# Patient Record
Sex: Male | Born: 1955 | Race: White | Hispanic: No | Smoking: Current some day smoker
Health system: Southern US, Community
[De-identification: ages and names within clinical notes are randomized; demographics above are authoritative.]

## PROBLEM LIST (undated history)

## (undated) DIAGNOSIS — I10 Essential (primary) hypertension: Secondary | ICD-10-CM

## (undated) DIAGNOSIS — C4490 Unspecified malignant neoplasm of skin, unspecified: Secondary | ICD-10-CM

## (undated) DIAGNOSIS — F419 Anxiety disorder, unspecified: Secondary | ICD-10-CM

## (undated) DIAGNOSIS — G629 Polyneuropathy, unspecified: Secondary | ICD-10-CM

## (undated) DIAGNOSIS — C801 Malignant (primary) neoplasm, unspecified: Secondary | ICD-10-CM

## (undated) DIAGNOSIS — J449 Chronic obstructive pulmonary disease, unspecified: Secondary | ICD-10-CM

## (undated) DIAGNOSIS — J45909 Unspecified asthma, uncomplicated: Secondary | ICD-10-CM

## (undated) DIAGNOSIS — F99 Mental disorder, not otherwise specified: Secondary | ICD-10-CM

## (undated) DIAGNOSIS — F329 Major depressive disorder, single episode, unspecified: Secondary | ICD-10-CM

## (undated) DIAGNOSIS — J189 Pneumonia, unspecified organism: Secondary | ICD-10-CM

## (undated) DIAGNOSIS — G8929 Other chronic pain: Secondary | ICD-10-CM

## (undated) DIAGNOSIS — M549 Dorsalgia, unspecified: Secondary | ICD-10-CM

## (undated) DIAGNOSIS — K219 Gastro-esophageal reflux disease without esophagitis: Secondary | ICD-10-CM

## (undated) DIAGNOSIS — I2510 Atherosclerotic heart disease of native coronary artery without angina pectoris: Secondary | ICD-10-CM

## (undated) DIAGNOSIS — R0602 Shortness of breath: Secondary | ICD-10-CM

## (undated) DIAGNOSIS — F32A Depression, unspecified: Secondary | ICD-10-CM

## (undated) HISTORY — DX: Malignant (primary) neoplasm, unspecified: C80.1

## (undated) HISTORY — PX: BRONCHOSCOPY: SUR163

## (undated) HISTORY — DX: Unspecified malignant neoplasm of skin, unspecified: C44.90

## (undated) HISTORY — PX: FEMUR SURGERY: SHX943

---

## 2002-11-07 ENCOUNTER — Encounter: Payer: Self-pay | Admitting: Specialist

## 2002-11-07 ENCOUNTER — Encounter: Payer: Self-pay | Admitting: Radiology

## 2002-11-07 ENCOUNTER — Encounter: Admission: RE | Admit: 2002-11-07 | Discharge: 2002-11-07 | Payer: Self-pay | Admitting: Specialist

## 2002-11-21 ENCOUNTER — Encounter: Payer: Self-pay | Admitting: Specialist

## 2002-11-21 ENCOUNTER — Encounter: Admission: RE | Admit: 2002-11-21 | Discharge: 2002-11-21 | Payer: Self-pay | Admitting: Specialist

## 2002-12-17 ENCOUNTER — Encounter: Payer: Self-pay | Admitting: Specialist

## 2002-12-17 ENCOUNTER — Encounter: Admission: RE | Admit: 2002-12-17 | Discharge: 2002-12-17 | Payer: Self-pay | Admitting: Specialist

## 2005-09-05 ENCOUNTER — Other Ambulatory Visit: Payer: Self-pay

## 2005-09-05 ENCOUNTER — Inpatient Hospital Stay: Payer: Self-pay | Admitting: Internal Medicine

## 2005-09-17 ENCOUNTER — Emergency Department (HOSPITAL_COMMUNITY): Admission: EM | Admit: 2005-09-17 | Discharge: 2005-09-17 | Payer: Self-pay | Admitting: Emergency Medicine

## 2007-02-07 ENCOUNTER — Other Ambulatory Visit: Payer: Self-pay

## 2007-02-07 ENCOUNTER — Inpatient Hospital Stay: Payer: Self-pay | Admitting: Internal Medicine

## 2007-08-04 ENCOUNTER — Emergency Department (HOSPITAL_COMMUNITY): Admission: EM | Admit: 2007-08-04 | Discharge: 2007-08-04 | Payer: Self-pay | Admitting: Emergency Medicine

## 2007-12-25 ENCOUNTER — Emergency Department: Payer: Self-pay | Admitting: Emergency Medicine

## 2008-05-03 ENCOUNTER — Emergency Department: Payer: Self-pay | Admitting: Emergency Medicine

## 2008-10-13 ENCOUNTER — Ambulatory Visit: Payer: Self-pay | Admitting: Internal Medicine

## 2009-01-12 ENCOUNTER — Ambulatory Visit: Payer: Self-pay | Admitting: Internal Medicine

## 2009-01-12 ENCOUNTER — Encounter (INDEPENDENT_AMBULATORY_CARE_PROVIDER_SITE_OTHER): Payer: Self-pay | Admitting: Emergency Medicine

## 2009-01-12 ENCOUNTER — Inpatient Hospital Stay (HOSPITAL_COMMUNITY): Admission: EM | Admit: 2009-01-12 | Discharge: 2009-01-20 | Payer: Self-pay | Admitting: Emergency Medicine

## 2009-01-13 ENCOUNTER — Encounter (INDEPENDENT_AMBULATORY_CARE_PROVIDER_SITE_OTHER): Payer: Self-pay | Admitting: Internal Medicine

## 2009-01-20 ENCOUNTER — Inpatient Hospital Stay (HOSPITAL_COMMUNITY): Admission: AD | Admit: 2009-01-20 | Discharge: 2009-02-03 | Payer: Self-pay | Admitting: Psychiatry

## 2009-01-20 ENCOUNTER — Ambulatory Visit: Payer: Self-pay | Admitting: Psychiatry

## 2009-02-13 ENCOUNTER — Emergency Department (HOSPITAL_COMMUNITY): Admission: EM | Admit: 2009-02-13 | Discharge: 2009-02-13 | Payer: Self-pay | Admitting: Emergency Medicine

## 2009-03-10 ENCOUNTER — Encounter: Payer: Self-pay | Admitting: Neurology

## 2009-03-27 ENCOUNTER — Encounter: Payer: Self-pay | Admitting: Neurology

## 2009-04-27 ENCOUNTER — Encounter: Payer: Self-pay | Admitting: Neurology

## 2009-05-08 ENCOUNTER — Emergency Department (HOSPITAL_COMMUNITY): Admission: EM | Admit: 2009-05-08 | Discharge: 2009-05-09 | Payer: Self-pay | Admitting: Emergency Medicine

## 2009-05-20 ENCOUNTER — Encounter: Payer: Self-pay | Admitting: Emergency Medicine

## 2009-05-21 ENCOUNTER — Ambulatory Visit: Payer: Self-pay | Admitting: Psychiatry

## 2009-05-21 ENCOUNTER — Inpatient Hospital Stay (HOSPITAL_COMMUNITY): Admission: AD | Admit: 2009-05-21 | Discharge: 2009-06-01 | Payer: Self-pay | Admitting: Psychiatry

## 2009-06-01 ENCOUNTER — Emergency Department (HOSPITAL_COMMUNITY): Admission: EM | Admit: 2009-06-01 | Discharge: 2009-06-01 | Payer: Self-pay | Admitting: Emergency Medicine

## 2009-06-27 HISTORY — PX: BACK SURGERY: SHX140

## 2010-01-24 ENCOUNTER — Emergency Department: Payer: Self-pay | Admitting: Unknown Physician Specialty

## 2010-04-15 ENCOUNTER — Ambulatory Visit: Payer: Self-pay

## 2010-05-07 ENCOUNTER — Ambulatory Visit: Payer: Self-pay | Admitting: Unknown Physician Specialty

## 2010-05-14 ENCOUNTER — Inpatient Hospital Stay: Payer: Self-pay | Admitting: Internal Medicine

## 2010-07-18 ENCOUNTER — Encounter: Payer: Self-pay | Admitting: Internal Medicine

## 2010-08-09 ENCOUNTER — Emergency Department (HOSPITAL_COMMUNITY)
Admission: EM | Admit: 2010-08-09 | Discharge: 2010-08-09 | Disposition: A | Discharge status: Home / Self Care | Payer: Medicaid Other | Attending: Emergency Medicine | Admitting: Emergency Medicine

## 2010-08-09 ENCOUNTER — Emergency Department (HOSPITAL_COMMUNITY): Payer: Medicaid Other

## 2010-08-09 DIAGNOSIS — R110 Nausea: Secondary | ICD-10-CM | POA: Insufficient documentation

## 2010-08-09 DIAGNOSIS — F919 Conduct disorder, unspecified: Secondary | ICD-10-CM | POA: Insufficient documentation

## 2010-08-09 DIAGNOSIS — R51 Headache: Secondary | ICD-10-CM | POA: Insufficient documentation

## 2010-08-09 DIAGNOSIS — F341 Dysthymic disorder: Secondary | ICD-10-CM | POA: Insufficient documentation

## 2010-08-17 ENCOUNTER — Other Ambulatory Visit: Payer: Self-pay | Admitting: Internal Medicine

## 2010-08-17 DIAGNOSIS — D497 Neoplasm of unspecified behavior of endocrine glands and other parts of nervous system: Secondary | ICD-10-CM

## 2010-08-31 ENCOUNTER — Ambulatory Visit: Payer: Self-pay | Admitting: Unknown Physician Specialty

## 2010-09-02 ENCOUNTER — Inpatient Hospital Stay: Payer: Self-pay | Admitting: Unknown Physician Specialty

## 2010-09-23 ENCOUNTER — Other Ambulatory Visit: Payer: Medicaid Other

## 2010-09-24 ENCOUNTER — Ambulatory Visit
Admission: RE | Admit: 2010-09-24 | Discharge: 2010-09-24 | Disposition: A | Discharge status: Home / Self Care | Payer: Medicaid Other | Source: Ambulatory Visit | Attending: Internal Medicine | Admitting: Internal Medicine

## 2010-09-24 DIAGNOSIS — D497 Neoplasm of unspecified behavior of endocrine glands and other parts of nervous system: Secondary | ICD-10-CM

## 2010-09-24 MED ORDER — GADOBENATE DIMEGLUMINE 529 MG/ML IV SOLN
10.0000 mL | Freq: Once | INTRAVENOUS | Status: AC | PRN
  Administered 2010-09-24: 10 mL via INTRAVENOUS

## 2010-09-28 LAB — CBC
HCT: 37.5 % — ABNORMAL LOW (ref 39.0–52.0)
Hemoglobin: 12.9 g/dL — ABNORMAL LOW (ref 13.0–17.0)
RBC: 4.02 MIL/uL — ABNORMAL LOW (ref 4.22–5.81)
RDW: 14.9 % (ref 11.5–15.5)

## 2010-09-28 LAB — BASIC METABOLIC PANEL
CO2: 29 mEq/L (ref 19–32)
Calcium: 8.7 mg/dL (ref 8.4–10.5)
GFR calc Af Amer: >60 mL/min (ref >60)
GFR calc non Af Amer: >60 mL/min (ref >60)
Glucose, Bld: 117 mg/dL — ABNORMAL HIGH (ref 70–99)
Potassium: 3.9 mEq/L (ref 3.5–5.1)
Sodium: 139 mEq/L (ref 135–145)

## 2010-09-29 LAB — BASIC METABOLIC PANEL
BUN: 23 mg/dL (ref 6–23)
CO2: 25 mEq/L (ref 19–32)
Glucose, Bld: 100 mg/dL — ABNORMAL HIGH (ref 70–99)
Potassium: 3.6 mEq/L (ref 3.5–5.1)
Sodium: 138 mEq/L (ref 135–145)

## 2010-09-29 LAB — COMPREHENSIVE METABOLIC PANEL
ALT: 14 U/L (ref 0–53)
AST: 18 U/L (ref 0–37)
Albumin: 4.3 g/dL (ref 3.5–5.2)
Alkaline Phosphatase: 62 U/L (ref 39–117)
Chloride: 103 mEq/L (ref 96–112)
GFR calc Af Amer: >60 mL/min (ref >60)
Potassium: 3.3 mEq/L — ABNORMAL LOW (ref 3.5–5.1)
Sodium: 142 mEq/L (ref 135–145)
Total Bilirubin: 0.9 mg/dL (ref 0.3–1.2)
Total Protein: 7.1 g/dL (ref 6.0–8.3)

## 2010-09-29 LAB — POCT CARDIAC MARKERS
Myoglobin, poc: 63.5 ng/mL (ref 12–200)
Myoglobin, poc: 76.6 ng/mL (ref 12–200)
Troponin i, poc: <0.05 ng/mL (ref 0.00–0.09)

## 2010-09-29 LAB — DIFFERENTIAL
Basophils Absolute: 0 10*3/uL (ref 0.0–0.1)
Basophils Absolute: 0 10*3/uL (ref 0.0–0.1)
Basophils Relative: 1 % (ref 0–1)
Basophils Relative: 1 % (ref 0–1)
Eosinophils Absolute: 0 10*3/uL (ref 0.0–0.7)
Eosinophils Absolute: 0.1 10*3/uL (ref 0.0–0.7)
Eosinophils Relative: 1 % (ref 0–5)
Eosinophils Relative: 1 % (ref 0–5)
Monocytes Absolute: 0.4 10*3/uL (ref 0.1–1.0)
Monocytes Absolute: 0.8 10*3/uL (ref 0.1–1.0)
Monocytes Relative: 7 % (ref 3–12)
Neutro Abs: 3.4 10*3/uL (ref 1.7–7.7)

## 2010-09-29 LAB — RAPID URINE DRUG SCREEN, HOSP PERFORMED
Amphetamines: NOT DETECTED
Cocaine: NOT DETECTED
Opiates: NOT DETECTED
Tetrahydrocannabinol: NOT DETECTED

## 2010-09-29 LAB — CBC
HCT: 46.3 % (ref 39.0–52.0)
HCT: 46.5 % (ref 39.0–52.0)
Hemoglobin: 16 g/dL (ref 13.0–17.0)
MCHC: 34.4 g/dL (ref 30.0–36.0)
Platelets: 110 10*3/uL — ABNORMAL LOW (ref 150–400)
RDW: 15.1 % (ref 11.5–15.5)
RDW: 15.6 % — ABNORMAL HIGH (ref 11.5–15.5)
WBC: 5.6 10*3/uL (ref 4.0–10.5)

## 2010-09-29 LAB — URINALYSIS, ROUTINE W REFLEX MICROSCOPIC
Bilirubin Urine: NEGATIVE
Hgb urine dipstick: NEGATIVE
Ketones, ur: NEGATIVE mg/dL
Specific Gravity, Urine: 1.03 (ref 1.005–1.030)
Urobilinogen, UA: 1 mg/dL (ref 0.0–1.0)

## 2010-09-29 LAB — LIPASE, BLOOD: Lipase: 17 U/L (ref 11–59)

## 2010-09-29 LAB — ETHANOL: Alcohol, Ethyl (B): <5 mg/dL (ref 0–10)

## 2010-10-02 LAB — COMPREHENSIVE METABOLIC PANEL
ALT: 23 U/L (ref 0–53)
CO2: 26 mEq/L (ref 19–32)
Calcium: 8.5 mg/dL (ref 8.4–10.5)
Creatinine, Ser: 0.91 mg/dL (ref 0.4–1.5)
GFR calc Af Amer: >60 mL/min (ref >60)
GFR calc non Af Amer: >60 mL/min (ref >60)
Glucose, Bld: 94 mg/dL (ref 70–99)
Sodium: 140 mEq/L (ref 135–145)
Total Protein: 6 g/dL (ref 6.0–8.3)

## 2010-10-02 LAB — PROTIME-INR
INR: 1 (ref 0.00–1.49)
Prothrombin Time: 13.1 seconds (ref 11.6–15.2)

## 2010-10-02 LAB — CBC
HCT: 36.6 % — ABNORMAL LOW (ref 39.0–52.0)
HCT: 42.9 % (ref 39.0–52.0)
Hemoglobin: 12.4 g/dL — ABNORMAL LOW (ref 13.0–17.0)
Hemoglobin: 14.4 g/dL (ref 13.0–17.0)
MCHC: 34 g/dL (ref 30.0–36.0)
MCV: 94.1 fL (ref 78.0–100.0)
RBC: 4.55 MIL/uL (ref 4.22–5.81)
RDW: 13.1 % (ref 11.5–15.5)
WBC: 8.1 10*3/uL (ref 4.0–10.5)

## 2010-10-02 LAB — HEPATIC FUNCTION PANEL
ALT: 22 U/L (ref 0–53)
AST: 23 U/L (ref 0–37)
Albumin: 4 g/dL (ref 3.5–5.2)
Alkaline Phosphatase: 64 U/L (ref 39–117)
Bilirubin, Direct: 0.1 mg/dL (ref 0.0–0.3)
Total Bilirubin: 0.6 mg/dL (ref 0.3–1.2)

## 2010-10-02 LAB — DIFFERENTIAL
Lymphocytes Relative: 20 % (ref 12–46)
Lymphs Abs: 1.3 10*3/uL (ref 0.7–4.0)
Monocytes Relative: 6 % (ref 3–12)
Neutrophils Relative %: 72 % (ref 43–77)

## 2010-10-03 LAB — BASIC METABOLIC PANEL
BUN: 13 mg/dL (ref 6–23)
BUN: 9 mg/dL (ref 6–23)
CO2: 29 mEq/L (ref 19–32)
CO2: 29 mEq/L (ref 19–32)
CO2: 30 mEq/L (ref 19–32)
Calcium: 8.2 mg/dL — ABNORMAL LOW (ref 8.4–10.5)
Chloride: 101 mEq/L (ref 96–112)
Chloride: 103 mEq/L (ref 96–112)
Chloride: 103 mEq/L (ref 96–112)
Chloride: 105 mEq/L (ref 96–112)
Creatinine, Ser: 0.97 mg/dL (ref 0.4–1.5)
Creatinine, Ser: 1.14 mg/dL (ref 0.4–1.5)
Creatinine, Ser: 3.27 mg/dL — ABNORMAL HIGH (ref 0.4–1.5)
GFR calc Af Amer: 24 mL/min — ABNORMAL LOW (ref >60)
GFR calc Af Amer: >60 mL/min (ref >60)
GFR calc Af Amer: >60 mL/min (ref >60)
GFR calc Af Amer: >60 mL/min (ref >60)
GFR calc non Af Amer: >60 mL/min (ref >60)
GFR calc non Af Amer: >60 mL/min (ref >60)
Glucose, Bld: 102 mg/dL — ABNORMAL HIGH (ref 70–99)
Glucose, Bld: 109 mg/dL — ABNORMAL HIGH (ref 70–99)
Glucose, Bld: 139 mg/dL — ABNORMAL HIGH (ref 70–99)
Glucose, Bld: 147 mg/dL — ABNORMAL HIGH (ref 70–99)
Potassium: 3.7 mEq/L (ref 3.5–5.1)
Potassium: 3.8 mEq/L (ref 3.5–5.1)
Potassium: 4.6 mEq/L (ref 3.5–5.1)
Sodium: 135 mEq/L (ref 135–145)
Sodium: 137 mEq/L (ref 135–145)
Sodium: 140 mEq/L (ref 135–145)

## 2010-10-03 LAB — CBC
HCT: 36.8 % — ABNORMAL LOW (ref 39.0–52.0)
HCT: 37.2 % — ABNORMAL LOW (ref 39.0–52.0)
HCT: 38.5 % — ABNORMAL LOW (ref 39.0–52.0)
HCT: 38.9 % — ABNORMAL LOW (ref 39.0–52.0)
HCT: 41.5 % (ref 39.0–52.0)
Hemoglobin: 12.6 g/dL — ABNORMAL LOW (ref 13.0–17.0)
Hemoglobin: 12.7 g/dL — ABNORMAL LOW (ref 13.0–17.0)
Hemoglobin: 12.9 g/dL — ABNORMAL LOW (ref 13.0–17.0)
Hemoglobin: 13.2 g/dL (ref 13.0–17.0)
MCHC: 33.9 g/dL (ref 30.0–36.0)
MCHC: 34.1 g/dL (ref 30.0–36.0)
MCHC: 34.7 g/dL (ref 30.0–36.0)
MCHC: 35.1 g/dL (ref 30.0–36.0)
MCV: 94.1 fL (ref 78.0–100.0)
MCV: 94.6 fL (ref 78.0–100.0)
MCV: 94.6 fL (ref 78.0–100.0)
MCV: 94.8 fL (ref 78.0–100.0)
MCV: 94.8 fL (ref 78.0–100.0)
Platelets: 103 K/uL — ABNORMAL LOW (ref 150–400)
Platelets: 114 10*3/uL — ABNORMAL LOW (ref 150–400)
Platelets: 161 10*3/uL (ref 150–400)
RBC: 3.89 MIL/uL — ABNORMAL LOW (ref 4.22–5.81)
RBC: 3.93 MIL/uL — ABNORMAL LOW (ref 4.22–5.81)
RBC: 4.02 MIL/uL — ABNORMAL LOW (ref 4.22–5.81)
RBC: 4.13 MIL/uL — ABNORMAL LOW (ref 4.22–5.81)
RDW: 13.5 % (ref 11.5–15.5)
RDW: 13.5 % (ref 11.5–15.5)
RDW: 14 % (ref 11.5–15.5)
RDW: 14 % (ref 11.5–15.5)
RDW: 14.2 % (ref 11.5–15.5)
WBC: 4.5 10*3/uL (ref 4.0–10.5)
WBC: 6.4 K/uL (ref 4.0–10.5)
WBC: 8.7 10*3/uL (ref 4.0–10.5)

## 2010-10-03 LAB — URINALYSIS, ROUTINE W REFLEX MICROSCOPIC
Bilirubin Urine: NEGATIVE
Glucose, UA: NEGATIVE mg/dL
Hgb urine dipstick: NEGATIVE
Ketones, ur: NEGATIVE mg/dL
Nitrite: NEGATIVE
Protein, ur: NEGATIVE mg/dL
Specific Gravity, Urine: 1.013 (ref 1.005–1.030)
Urobilinogen, UA: 1 mg/dL (ref 0.0–1.0)
pH: 5.5 (ref 5.0–8.0)

## 2010-10-03 LAB — IRON AND TIBC
Saturation Ratios: 57 % — ABNORMAL HIGH (ref 20–55)
TIBC: 307 ug/dL (ref 215–435)

## 2010-10-03 LAB — CK TOTAL AND CKMB (NOT AT ARMC)
CK, MB: 3.1 ng/mL (ref 0.3–4.0)
Relative Index: 0.6 (ref 0.0–2.5)
Total CK: 367 U/L — ABNORMAL HIGH (ref 7–232)
Total CK: 542 U/L — ABNORMAL HIGH (ref 7–232)

## 2010-10-03 LAB — CARDIAC PANEL(CRET KIN+CKTOT+MB+TROPI)
CK, MB: 1.8 ng/mL (ref 0.3–4.0)
CK, MB: 2.1 ng/mL (ref 0.3–4.0)
Relative Index: 0.8 (ref 0.0–2.5)
Relative Index: 0.9 (ref 0.0–2.5)

## 2010-10-03 LAB — GLUCOSE, CAPILLARY: Glucose-Capillary: 161 mg/dL — ABNORMAL HIGH (ref 70–99)

## 2010-10-03 LAB — ACTH STIMULATION, 3 TIME POINTS: Cortisol, Base: 0.4 ug/dL

## 2010-10-03 LAB — BASIC METABOLIC PANEL WITH GFR
BUN: 37 mg/dL — ABNORMAL HIGH (ref 6–23)
Calcium: 8.1 mg/dL — ABNORMAL LOW (ref 8.4–10.5)
GFR calc non Af Amer: 20 mL/min — ABNORMAL LOW (ref >60)
Potassium: 3.7 meq/L (ref 3.5–5.1)
Sodium: 139 meq/L (ref 135–145)

## 2010-10-03 LAB — CULTURE, BLOOD (ROUTINE X 2)
Culture: NO GROWTH
Culture: NO GROWTH

## 2010-10-03 LAB — LIPID PANEL
Cholesterol: 158 mg/dL (ref 0–200)
HDL: 36 mg/dL — ABNORMAL LOW (ref >39)
LDL Cholesterol: 98 mg/dL (ref 0–99)
Triglycerides: 122 mg/dL (ref <150)

## 2010-10-03 LAB — DIFFERENTIAL
Eosinophils Absolute: 0 10*3/uL (ref 0.0–0.7)
Eosinophils Relative: 0 % (ref 0–5)
Lymphs Abs: 0.3 10*3/uL — ABNORMAL LOW (ref 0.7–4.0)
Monocytes Relative: 1 % — ABNORMAL LOW (ref 3–12)
Neutrophils Relative %: 91 % — ABNORMAL HIGH (ref 43–77)

## 2010-10-03 LAB — COMPREHENSIVE METABOLIC PANEL
ALT: 21 U/L (ref 0–53)
AST: 33 U/L (ref 0–37)
Calcium: 8.1 mg/dL — ABNORMAL LOW (ref 8.4–10.5)
GFR calc Af Amer: 37 mL/min — ABNORMAL LOW (ref >60)
Sodium: 141 mEq/L (ref 135–145)
Total Protein: 6.7 g/dL (ref 6.0–8.3)

## 2010-10-03 LAB — URINE CULTURE

## 2010-10-03 LAB — POCT I-STAT 3, ART BLOOD GAS (G3+): O2 Saturation: 96 %

## 2010-10-03 LAB — CORTISOL: Cortisol, Plasma: 0.6 ug/dL

## 2010-10-03 LAB — TESTOSTERONE, % FREE: Testosterone-% Free: 2 % — ABNORMAL HIGH (ref 1.6–2.9)

## 2010-10-03 LAB — RAPID URINE DRUG SCREEN, HOSP PERFORMED
Cocaine: NOT DETECTED
Opiates: NOT DETECTED

## 2010-10-03 LAB — LIPASE, BLOOD: Lipase: 13 U/L (ref 11–59)

## 2010-10-03 LAB — TSH: TSH: 0.063 u[IU]/mL — ABNORMAL LOW (ref 0.350–4.500)

## 2010-10-03 LAB — TROPONIN I: Troponin I: 0.01 ng/mL (ref 0.00–0.06)

## 2010-10-03 LAB — FOLLICLE STIMULATING HORMONE: FSH: 2.7 m[IU]/mL (ref 1.4–18.1)

## 2010-10-03 LAB — ACTH: C206 ACTH: 6 pg/mL — ABNORMAL LOW (ref 10–46)

## 2010-10-03 LAB — RETICULOCYTES
RBC.: 4.04 MIL/uL — ABNORMAL LOW (ref 4.22–5.81)
Retic Count, Absolute: 28.3 10*3/uL (ref 19.0–186.0)

## 2010-10-03 LAB — RPR: RPR Ser Ql: NONREACTIVE

## 2010-10-03 LAB — TESTOSTERONE, FREE: Testosterone, Free: 40.3 pg/mL — ABNORMAL LOW (ref 47.0–244.0)

## 2010-11-09 NOTE — Discharge Summary (Signed)
Steven Wiley, Steven Wiley                 ACCOUNT NO.:  [id number]   MEDICAL RECORD NO.:  [id number]           PATIENT TYPE:   LOCATION:                                 FACILITY:   PHYSICIAN:  Alvester Morin, M.D.  DATE OF BIRTH:  [birth year]   DATE OF ADMISSION:  DATE OF DISCHARGE:                               DISCHARGE SUMMARY   Attending:  Dr. Harriett Sine Phifer   DISCHARGE DIAGNOSES:  1. Adrenal insufficiency.  2. Chronic obstructive pulmonary disease with acute exacerbation.  3. Chest pain, resolved.  4. Bipolar disorder, not otherwise specified.  5. Anxiety disorder, not otherwise specified.  6. Pituitary microadenoma.  7. Headache.  8. Tinnitus.  9. Low testosterone.  10.Thrush.  11.Thrombocytopenia, resolved.  12.Acute renal failure, resolved.  13.Chronic back pain.   DISCHARGE MEDICATIONS:  1. Albuterol 90 mcg per spray 2 puffs inhaled q.4-6 h. p.r.n.      shortness of breath.  2. Advair 250/50 mcg 1 puff inhaled b.i.d.  3. Aspirin 81 mg p.o. daily.  4. Prednisone 20 mg p.o. once on Monday, January 19, 2009.  5. Prednisone 10 mg p.o. once on Tuesday, January 20, 2009.  6. Hydrocortisone 20 mg by mouth between 6 and 8 a.m. and 10 mg by      mouth between 4 and 6 p.m. starting on Wednesday, January 21, 2009.  7. Ativan 0.5 mg p.o. q.8 h. p.r.n. anxiety.  8. Depakote 250 mg p.o. daily at 6:00 p.m.  9. Artificial tears 1 drop per eye 3 times daily.  10.Prilosec 40 mg p.o. daily.  11.Oxycodone 5-10 mg p.o. q.6 h. p.r.n. pain.  12.Nystatin 5 mL by mouth 3 times daily until Monday, January 26, 2009.   DISPOSITION AND FOLLOWUP:  1. The patient will be transferred to behavioral health for inpatient      psychiatric treatment.  2. Follow up with Dr. Sharl Ma at [facility] on      Wednesday, January 21, 2009, at 10:15 in the morning for a further      workup and management of adrenal insufficiency.  3. The patient will need a repeat MRI of pituitary and brain with  contrast in 6 months to follow up on the identified microadenoma of      the pituitary.  4. The patient will need repeat CBC and LFTs before February 08, 2009,      as Depakote was started during this admission.   PROCEDURES PERFORMED:  1. CT head without contrast on January 12, 2009:  No acute intracranial      abnormality.  Mild small vessel ischemic change.  2. Chest x-ray on January 12, 2009:  No acute cardiopulmonary disease.      Mild peribronchial thickening, which may be related to chronic      bronchitis or smoking.  3. A 2-D echocardiogram on January 13, 2009:  The left ventricle cavity      size was normal.  Systolic function was normal with an estimated      ejection fraction in the range of 55-60%.  Left ventricular wall  motion was normal.  There was mild mitral regurgitation.  The peak      pulmonary artery pressure was 42 mmHg.  4. MR of the brain with and without contrast on January 15, 2009:  Two      areas of relative delayed enhancement within the pituitary gland      were seen on dynamic imaging.  The larger is in the right side of      the gland. measuring 6.5 x 5.7 mm.  The smaller is on the left,      measuring 2.8 x 4.8 mm.  Both of these are concerning for pituitary      microadenomas.  No acute intracranial abnormalities are present.      Specifically, there is no evidence for acute infarct, hemorrhage,      mass, hydrocephalus, or extra-axial fluid collection.   CONSULTATIONS:  1. Tonita Cong, MD of [facility] at [facility],      Endocrinology.  2. Antonietta Breach, MD from Psychiatry.   ADMITTING HISTORY AND PHYSICAL:  History of Present Illness:  Steven Wiley  is a 55 year old male with no significant past medical history besides  chronic back pain, who presents to the emergency department complaining  of headache and weakness.  He says his headache is an 8/10 with 4  episodes per day that started approximately 6 months ago.  He is also  complaining of  weakness, tiredness since 3 weeks ago.  He has noticed  decreased appetite and poor oral intake.  He relates chest pain 1  episode on the day prior to admission, which lasted approximately 10  minutes, that was described as being pressure in quality, 6/10,  accompanied by shortness of breath.  He has also had a cough for the  past couple of weeks with yellow sputum.   ADMISSION PHYSICAL EXAMINATION:  VITAL SIGNS:  Temperature 98, blood  pressure 90/68 which dropped to a minimum of 67/28, pulse 53,  respiratory rate 15, oxygen saturation 94% on room air.  GENERAL:  The patient was lying in bed, in no acute distress.  HEENT:  His pupils were equal and reactive to light and accommodation,  and his extraocular movements were intact.  He had dry mucous membranes.  NECK:  Supple without thyromegaly.  CHEST:  There is bilateral air movement on his pulmonary exam; however,  there were scattered wheezes.  CARDIOVASCULAR:  Normal S1 and S2 with a regular rate and rhythm with  distant heart sounds.  ABDOMEN:  Positive bowel sounds, soft, obese, mild epigastric  tenderness.  EXTREMITIES:  There was no edema in his extremities, and he had full  peripheral pulses.  There were a number of small erythematous patches on  the left under arm, which were not indurated and nonerythematous, which  the patient said stemmed from a recent black widow spider bite.  There  were no other rashes.  There was no lymphadenopathy.  There was no joint  swelling or edema.  NEUROLOGIC:  He had a nonfocal neurologic exam.  PSYCH:  His weight was appropriate.   ADMISSION STUDIES AND LABORATORY DATA:  1. CT of the head:  Please see above under procedures performed.  2. Chest x-ray:  Please see above under procedures performed.  3. BMP:  Sodium 139, potassium 3.7, chloride 101, bicarbonate 30, BUN      37, creatinine 3.27, glucose 102, calcium 8.1.  4. CBC:  White blood cell count 6.4, hemoglobin 12.7, platelets 103,  MCV 94.  5. Cardiac biomarkers:  Total CK 542, CK-MB 3.1, troponin I 2.01.  6. Urine drug screen:  Positive for benzodiazepines.  7. Urinalysis:  Normal.  8. EKG showed normal sinus rhythm with a rate of 64, normal intervals,      normal axis, no ischemic changes.   HOSPITAL COURSE:  1. Adrenal insufficiency:  Mr. Gikas presented to the emergency      department with weakness, fatigue, and chronic headache, and was      found to be hypotensive to a minimum of 70/30.  He was volume      resuscitated, and his blood pressure rose and remained stable      around 110-130 over 60-80 throughout the remainder of his      hospitalization.  A cortisol level was checked and found to be      markedly depressed at 0.6.  The patient received Solu-Medrol      (transitioned to a prednisone taper) for his COPD exacerbation (see      problem #2), and the dose of prednisone was more than enough to      treat his adrenal insufficiency.  An ACTH stimulation test was      performed and showed a baseline cortisol of 0.4, a 30-minute      cortisol of 6.4, and a 60-minute cortisol of 9.5.  This was      consistent with secondary adrenal insufficiency with sluggish      response secondary to adrenal atrophy from chronically low ACTH      levels.  An ACTH level was drawn to confirm secondary vs primary      adrenal insufficiency but was pending at the time of discharge.      FSH, TSH, and testosterone levels were drawn to evaluate for      panhypopituitarism.  FSH was found to be at the low end of normal      at 2.7, free testosterone was low at 40.3, and TSH was markedly      depressed at 0.063.  This made [place] suspicious for pituitary      insufficiency.  An MRI of the brain showed 2 pituitary      microadenomas.  TSH was rechecked and was found to be normal as was      the free T4.  We feel that the TSH may have been artificially      suppressed by his preceding doses of Solu-Medrol and Decadron      (which  he received in the ED as part of the headache protocol).      Dr. Sharl Ma from Endocrinology was consulted and his help was greatly      appreciated.  Per his recommendations, additional outpatient workup      will be performed (including IGF-1, prolactin, ACTH, and repeat      testosterone, cortisol).  Mr. Donaghue should be placed on      hydrocortisone 20 mg by mouth in the morning and 10 mg by mouth in      the evening once his prednisone is tapered to less than 7.5 mg per      day or less.  We have arranged for this, hydrocortisone to be      started on Wednesday, January 21, 2009.  He should also have a repeat      MR of the brain and pituitary to assess for interval change in the      microadenomas in approximately 6 months.  2. Chronic obstructive pulmonary disease exacerbation:  Mr. Faries      presented with bilateral wheezing and oxygen saturation of 94% on      room air.  His symptoms worsened on the night of admission.  His      chest x-ray was consistent with chronic bronchitis.  He was treated      with Solu-Medrol, later transitioned to prednisone.  He also      received scheduled albuterol and ipratropium nebulizers.  He      completed a 7-day course of moxifloxacin.  He remained afebrile      throughout, and his symptoms steadily improved.  He was stable on      room air at the time of discharge.  3. Chest pain, resolved:  He complained of chest pain at admission.      In the setting of hypotension, cardiogenic shock was in the      differential.  He was ruled out for an acute coronary syndrome with      a normal EKG, 3 sets of cardiac biomarkers with normal CK-MB and      troponin I.  He was monitored on telemetry without event until he      was transferred to the regular floor.  He had a 2-D echocardiogram,      which was consistent with his history of COPD.  He was placed on      pantoprazole to address any element of GERD.  His chest discomfort      resolved significantly with  treatment of his COPD exacerbation and      anxiety probably at least contributed to his symptoms.  4. Bipolar disorder, not otherwise specified:  The patient had a very      labile affect throughout his admission, periodically having bouts      of crying.  He describes significant stressors and the lack of      social support.  His low cortisol was likely also contributing to      his mood abnormalities.  When confronted about his safety from a      psychiatric standpoint, he neither endorsed nor denied suicidal      ideation.  Dr. Jeanie Sewer was consulted, and his help was greatly      appreciated.  Bipolar disorder NOS was diagnosed, and the patient      was started on Depakote.  He will be transferred to an inpatient      psychiatric unit for additional treatment and safety assessment      prior to his discharge home.  5. Anxiety disorder, not otherwise specified:  He also had significant      anxiety throughout the admission, regarding his medical condition,      his life situation, and his insomnia.  He received Ativan p.r.n.      anxiety with an adequate relief.  He also received zolpidem for      sleep, which was effective.  6. Pituitary microadenomas:  These were identified on the MRI.  They      were possibly resulting in decreased pituitary production of ACTH      secondary to a mass effect and this causing his adrenal      insufficiency and other hormonal abnormalities.  This likely also      contributed to his headache.  He did not have peripheral vision      abnormalities on bedside exam.  He will have followup with Dr. Sharl Ma  and will need repeat a MR in 6 months to assess for interval change      in these lesions.  7. Headache:  The patient has a chronic headache for the past 6      months, which he thinks began around the time of the epidural he      received for treatment of chronic back pain.  He has been to the      emergency department a number of times over the  past few months,      where he has had 2 normal CTs of the head and a normal lumbar      puncture.  His pituitary macroadenomas may be causing his headache      and his cortisol deficiency is likely contributing.  Furthermore,      there is likely a tension versus migraine headache component given      his current life stressors.  He did have moderate relief of his      headache from the oxycodone he received for his back pain.  8. Tinnitus:  He complained of chronic tinnitus, worsening over the      preceding months.  His MR did not show any positive CNS      abnormalities.  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  He has not been on any ototoxic medications.  Both      depression and insomnia can be associated with tinnitus, and we are      treating these problems in hope that his tinnitus improves;      otherwise, he may need ENT referral or behavioral therapy, such as,      tinnitus retraining therapy or a masking device.  9. Thrush:  The patient had thrush at admission, was treated with      nystatin with resolved symptoms by discharge.  He will need to      complete an additional 1 week of nystatin.  He was HIV negative.      His thrush was attributed to his recent use of prednisone and      clindamycin.  10.Thrombocytopenia, resolved:  He presented with platelets of 103,      which fell to a nadir of 86 and then normalized to 161.  Review of      records show that his platelets were steadily slowly falling since      late 2009.  The etiology of this is unclear.  DIC and TTP were      ruled out by the absence of schistocytes on peripheral smear and      normal coagulation studies.  He does not have any evidence of liver      disease and denies alcoholism.  It is possible that he had an      element of splenic sequestration as splenic enlargement can be      associated with adrenal  insufficiency.  He was not on any      incriminating medications consistently since late 2009.  He was on      clindamycin at admission for treatment of his recent spider bite      versus hidradenitis suppurativa, and this was discontinued.  It is      also possible he had idiopathic thrombocytopenia, and his platelet     improvement was secondary to the receive  of steroids for his other      conditions.  Therefore, it will be important to monitor his      platelets as an outpatient.  11.Acute renal failure:  The patient presented in acute renal failure      with a creatinine of 3.2.  His FENa was less than 1%, and his renal      failure resolved quickly with IV fluids.  12.Chronic back pain:  This stems from an injury in 2003 at work.  He      has been followed at [facility] where he has received      epidural injections.  He also takes oxycodone at home, this was      continued here with adequate pain control.   DISCHARGE LABORATORY DATA AND VITAL SIGNS:  1. Discharge vital signs from January 19, 2009, at 5:00 a.m. showed a      temperature of 98.1, pulse 62, respiratory rate 20, blood pressure      101/59, O2 sat 94% on room air.  2. Discharge laboratory data:  CBC drawn on January 17, 2009, showed a      white count of 11.4, hemoglobin 14.1,      platelets of 161.  BMP drawn on January 16, 2009, showed a sodium of      138, potassium 3.8, bicarbonate 29, BUN 9, creatinine 0.97.   Dictation by Ezekiel Slocumb, MS IV      Joaquin Courts, MD  Electronically Signed      Alvester Morin, M.D.  Electronically Signed    VW/MEDQ  D:  01/19/2009  T:  01/19/2009  Job:  [id number]   cc:   [facility]  Veverly Fells. [place], M.D.  Tonita Cong, M.D.  Antonietta Breach, M.D.

## 2010-11-09 NOTE — Discharge Summary (Signed)
[name], [name]                 ACCOUNT NO.:  [id number]   MEDICAL RECORD NO.:  [id number]          PATIENT TYPE:  IPS   LOCATION:  0407                          FACILITY:  [facility]   PHYSICIAN:  Anselm Jungling, MD  DATE OF BIRTH:  1955-11-28   DATE OF ADMISSION:  01/20/2009  DATE OF DISCHARGE:  02/03/2009                               DISCHARGE SUMMARY   IDENTIFYING DATA AND REASON FOR ADMISSION:  This was an inpatient  psychiatric admission for Steven Wiley, a 55 year old Caucasian male, who was  transferred to [place] from [facility] due to severe depression.  He had  been treated at [facility] for symptoms of headache, tinnitus, and  renal insufficiency.  While there he was found to have a pituitary  microadenoma.  He was stabilized, and his care was assumed by [facility] for follow-up of his endocrine problem.   He had seen the psychiatric consultant prior to transfer to inpatient  Psychiatry Service.  Please refer to the discharge summary dictated by  Dr. Harvie Junior of the  Internal Medicine Team.  Please refer to the  admission note for further details pertaining to the symptoms,  circumstances, and history that led to his psychiatric admission.  He  was given an initial Axis I diagnosis of depressive disorder NOS.   MEDICAL AND LABORATORY:  As above.   He came to [place] on a regimen of albuterol, Advair, aspirin, prednisone,  Prilosec, oxycodone, and Nystatin rinse.  There were no significant  medical issues during his stay.  He was referred to follow-up with [facility] and an appropriate endocrinologist at the time of discharge.   HOSPITAL COURSE:  The patient was admitted to the adult inpatient  Psychiatric Service.  He presented as a well-nourished, normally-  developed adult male who was nonpsychotic, but depressed with sad  affect.  He denied any active suicidal ideation, plan or intent.  He was  treated with a psychotropic regimen that included Lexapro, Klonopin,  and  to address severe unremitting anxiety Risperdal 1 mg up to 4 times  daily.  He was an excellent participant in the treatment program  participating very well in therapeutic groups and activities throughout  the stay.  He developed a good deal of insight during his stay.  He  understood that some of his mood-related symptoms may have had an  underlying endocrine basis.   The patient was discharged on the 14th hospital day.  He agreed to the  following aftercare plan.   AFTERCARE:  The patient was to follow up with [facility] in  [place] with an appointment on August 11 at 8:30 a.m.  He was also to  follow-up with Dr. Andreas Blower on August 10 at 8:30 a.m.  He was to see Dr.  Sharl Ma on August 11 at 1 p.m. at [facility]  in [place].   DISCHARGE MEDICATIONS:  1. Aspirin 81 mg daily.  2. Protonix 40 mg daily.  3. Lexapro 20 mg daily.  4. Klonopin 0.5 mg q.i.d.  5. Risperdal 1 mg q.i.d.  6. Claritin 10 mg  daily.  7. Hydrocortisone  20 mEq q.a.m. and 10 mg q. 4 p.m..   DISCHARGE DIAGNOSES:  Axis 1:  Major depressive episode.  Axis II:  Deferred.  Axis III:  1.  __________ .  2.  Pituitary adenoma.  3.  Renal  insufficiency.  Axis IV:  Stressors severe.  Axis V:  Global assessment of functioning on discharge 55.      Anselm Jungling, MD  Electronically Signed     SPB/MEDQ  D:  02/09/2009  T:  02/09/2009  Job:  [id number]

## 2010-11-09 NOTE — H&P (Signed)
NAMEEHTAN, DELFAVERO                 ACCOUNT NO.:  [id number]   MEDICAL RECORD NO.:  [id number]          PATIENT TYPE:  IPS   LOCATION:  0407                          FACILITY:  [facility]   PHYSICIAN:  Anselm Jungling, MD  DATE OF BIRTH:  Jan 12, 1956   DATE OF ADMISSION:  01/20/2009  DATE OF DISCHARGE:                       PSYCHIATRIC ADMISSION ASSESSMENT   IDENTIFICATION:  This is a 55 year old male.  This is a voluntary  admission.   HISTORY OF PRESENT ILLNESS:  First inpatient psychiatric admission for  this 55 year old male who was initially admitted to the medical unit of  [facility] on July 19 for evaluation of severe headache and tinnitus.  He was diagnosed at that time with renal insufficiency and low  testosterone level and treated for comorbid symptoms of headache and  tinnitus.  Radiologic studies revealed a pituitary microadenoma.  The  patient was stabilized on medications and referred to Dr. Talmage Coin  of [facility] for follow-up of his endocrine  problems.  While he was there, he did express some symptoms of depressed  mood, low energy and anxiety and received a consultation by Dr. Antonietta Breach, who diagnosed him with mood disorder and started him on  Depakote 250 mg once a day.  Please see the discharge summary dictated  by Dr. Harriett Sine Phifer of the internal medicine team.   Today, Mr. Crom admits to having some low mood, some depression,  primarily concerned about his medical condition and getting appropriate  follow-up.  He denies any suicidal thoughts.  No previous history of any  psychiatric treatment or any suicide attempts or suicidal thoughts.  He  denies a history of substance abuse.   PAST PSYCHIATRIC HISTORY:  No previous treatment.  No significant  history of mood disorder or substance abuse.  Dr. Providence Crosby consult  reflects some concerns of displaying labile mood while on the medical  unit with some pressured speech and  possibly some tangential thought.  The patient himself denies any history of significant mood problems or  substance abuse.  He did take Xanax from time to time, prescribed by his  primary care practitioner, for some anxiety and stress.   SOCIAL HISTORY:  This is a single male who suffered a debilitating back  injury 7 years ago with some resulting chronic back pain.  Also suffered  the death of one of his sons 2 years ago.  He currently lives alone,  maintains his own apartment.  He is currently living in the home of his  deceased father.  He is currently unemployed.  Attended high school and  some technical school after graduating high school.  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   MEDICAL HISTORY:  Primary care practitioner is unknown.  He will follow  up with Dr. and Talmage Coin at [facility] for  endocrinology needs.   CURRENT MEDICAL PROBLEMS:  Adrenal insufficiency, COPD with previous  acute exacerbation, now stabilized.  Chest pain, resolved.  Pituitary  microadenoma.  Headache NOS.  Tinnitus NOS.  Low testosterone level.  Thrombocytopenia, resolved.  Acute renal failure, resolved.  Chronic  back pain.   CURRENT MEDICATIONS:  1. Albuterol 90 mcg MDI 2 puffs q. 4 hours p.r.n. for asthma or      shortness of breath/  2. Advair 250/50 1 puff twice a day  3. Aspirin 81 mg p.o. daily.  4. Prednisone 20 mg q. 6 a.m., 10 mg q. 4 p.m.  5. Ativan 0.5 mg q.8 h p.r.n. anxiety.  6. Depakote 250 mg p.o. daily at 6:00 p.m.  7. Artificial tears t.i.d. 1 drop each eye.  8. Prilosec 40 mg daily.  9. Oxycodone 5-10 mg q.6 h p.r.n. pain.  10.Nystatin 5 mL t.i.d. swish and swallow through August 2.   DRUG ALLERGIES:  None.   PHYSICAL EXAM:  Was done in the emergency room.  It is noted in the  record.  Follow-up assessments by the medical team.   Today he is a fully  alert male in no distress, normal vital signs, up  and about on the unit, participating appropriately in all activities.   DIAGNOSTIC STUDIES:  Are noted in the record, along with a CT scan of  his brain.  His urine drug screen was positive for benzodiazepines at  the time of admission.  He did have some thrombocytopenia with platelets  of 103, which were normalized at 161,000.  His basic metabolic panel  initially reflected sodium of 139, potassium of 3.7.  Initially, BUN was  37, creatinine 3.27, which resolved promptly with IV fluids.  Baseline  cortisol level at admission was 0.4, now stabilized.   MENTAL STATUS EXAM:  Reveals a fully alert male, pleasant, cooperative.  Insight adequate.  Worried about his medical condition and wondering  about various aspects of his history that may have contributed to it.  Denies any suicidal thoughts or dangerous ideas.  Concerned about  getting promptly to his outpatient endocrine consults.  Gives a coherent  history.  His speech is normal.  Normal tone, pace, production, form.  No dangerous ideas.  No evidence of thought process.  No flight of  ideas.  Focus is good, follows conversation well.  Memory completely  intact.   AXIS I:  Rule out depressive disorder NOS.  AXIS II:  No diagnosis.  AXIS III:  Adrenal insufficiency, pituitary microadenoma, history of  COPD, history of acute renal failure, resolved.  Chronic back pain.  Thrombocytopenia, resolved.  Headache NOS.  AXIS IV:  Deferred.  AXIS V:  Current 55.  Past year not known.   PLAN:  Plan is to voluntarily admit him for observation.  We will go  ahead he is on our stabilization unit.  We are continuing his  medications as prescribed by internal medicine and have arranged for him  follow-up with endocrinology Friday morning, July 30, at 11:40 a.m. with  Dr. Debara Pickett.  We are holding the Depakote at this time to see how his  mood is and how he responds on the unit.  We will continue  to evaluate  him.     Margaret A. Lorin Picket, N.P.      Anselm Jungling, MD  Electronically Signed   MAS/MEDQ  D:  01/21/2009  T:  01/21/2009  Job:  [phone]

## 2010-11-09 NOTE — Consult Note (Signed)
NAMELAWERANCE, MATSUO                 ACCOUNT NO.:  [id number]   MEDICAL RECORD NO.:  [id number]          PATIENT TYPE:  INP   LOCATION:  5508                         FACILITY:  [facility]   PHYSICIAN:  Antonietta Breach, M.D.  DATE OF BIRTH:  Nov 08, 1955   DATE OF CONSULTATION:  01/17/2009  DATE OF DISCHARGE:                                 CONSULTATION   ADDENDUM:  Once cleared from a general medical perspective, I would  admit Mr. Aumiller to an inpatient psychiatric unit for further evaluation  and treatment.      Antonietta Breach, M.D.  Electronically Signed     JW/MEDQ  D:  01/17/2009  T:  01/17/2009  Job:  [id number]
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
## 2010-12-07 ENCOUNTER — Ambulatory Visit: Payer: Self-pay | Admitting: Unknown Physician Specialty

## 2011-11-13 ENCOUNTER — Emergency Department: Payer: Self-pay | Admitting: Emergency Medicine

## 2011-11-13 LAB — URINALYSIS, COMPLETE
Bilirubin,UR: NEGATIVE
Glucose,UR: NEGATIVE mg/dL (ref 0–75)
Leukocyte Esterase: NEGATIVE
Nitrite: NEGATIVE
Ph: 6 (ref 4.5–8.0)
RBC,UR: NONE SEEN /HPF (ref 0–5)
Specific Gravity: 1.048 (ref 1.003–1.030)
WBC UR: <1 /HPF (ref 0–5)

## 2011-11-13 LAB — DRUG SCREEN, URINE
Barbiturates, Ur Screen: NEGATIVE (ref ?–200)
Benzodiazepine, Ur Scrn: NEGATIVE (ref ?–200)
Cocaine Metabolite,Ur ~~LOC~~: NEGATIVE (ref ?–300)
MDMA (Ecstasy)Ur Screen: NEGATIVE (ref ?–500)
Methadone, Ur Screen: NEGATIVE (ref ?–300)

## 2011-11-13 LAB — COMPREHENSIVE METABOLIC PANEL
Albumin: 3.8 g/dL (ref 3.4–5.0)
Alkaline Phosphatase: 94 U/L (ref 50–136)
Anion Gap: 8 (ref 7–16)
BUN: 22 mg/dL — ABNORMAL HIGH (ref 7–18)
Bilirubin,Total: 0.8 mg/dL (ref 0.2–1.0)
Calcium, Total: 8.5 mg/dL (ref 8.5–10.1)
Co2: 25 mmol/L (ref 21–32)
EGFR (Non-African Amer.): >60
Glucose: 108 mg/dL — ABNORMAL HIGH (ref 65–99)
SGOT(AST): 29 U/L (ref 15–37)
SGPT (ALT): 30 U/L
Sodium: 139 mmol/L (ref 136–145)
Total Protein: 7.2 g/dL (ref 6.4–8.2)

## 2011-11-13 LAB — CK TOTAL AND CKMB (NOT AT ARMC): CK-MB: 1.5 ng/mL (ref 0.5–3.6)

## 2011-11-13 LAB — CBC
HCT: 47.7 % (ref 40.0–52.0)
HGB: 16.3 g/dL (ref 13.0–18.0)
MCHC: 34.2 g/dL (ref 32.0–36.0)
MCV: 97 fL (ref 80–100)
RBC: 4.92 10*6/uL (ref 4.40–5.90)
WBC: 7.9 10*3/uL (ref 3.8–10.6)

## 2011-11-13 LAB — TROPONIN I: Troponin-I: <0.02 ng/mL

## 2011-11-13 LAB — LIPASE, BLOOD: Lipase: 166 U/L (ref 73–393)

## 2012-02-09 ENCOUNTER — Emergency Department: Payer: Self-pay | Admitting: Emergency Medicine

## 2012-02-09 LAB — COMPREHENSIVE METABOLIC PANEL
Alkaline Phosphatase: 93 U/L (ref 50–136)
Anion Gap: 6 — ABNORMAL LOW (ref 7–16)
BUN: 15 mg/dL (ref 7–18)
Bilirubin,Total: 0.3 mg/dL (ref 0.2–1.0)
Calcium, Total: 8.8 mg/dL (ref 8.5–10.1)
Chloride: 105 mmol/L (ref 98–107)
Co2: 29 mmol/L (ref 21–32)
Creatinine: 1.08 mg/dL (ref 0.60–1.30)
EGFR (Non-African Amer.): >60
Glucose: 145 mg/dL — ABNORMAL HIGH (ref 65–99)
Osmolality: 283 (ref 275–301)
Potassium: 3.7 mmol/L (ref 3.5–5.1)
SGOT(AST): 31 U/L (ref 15–37)
Sodium: 140 mmol/L (ref 136–145)

## 2012-02-09 LAB — URINALYSIS, COMPLETE
Bacteria: NONE SEEN
Blood: NEGATIVE
Glucose,UR: NEGATIVE mg/dL (ref 0–75)
Leukocyte Esterase: NEGATIVE
RBC,UR: 2 /HPF (ref 0–5)
Squamous Epithelial: NONE SEEN

## 2012-02-09 LAB — CBC
HCT: 48.3 % (ref 40.0–52.0)
HGB: 16.4 g/dL (ref 13.0–18.0)
MCH: 32.9 pg (ref 26.0–34.0)
MCV: 97 fL (ref 80–100)
RDW: 13.8 % (ref 11.5–14.5)
WBC: 7 10*3/uL (ref 3.8–10.6)

## 2012-02-09 LAB — DRUG SCREEN, URINE
Barbiturates, Ur Screen: NEGATIVE (ref ?–200)
Cannabinoid 50 Ng, Ur ~~LOC~~: NEGATIVE (ref ?–50)
MDMA (Ecstasy)Ur Screen: NEGATIVE (ref ?–500)
Opiate, Ur Screen: NEGATIVE (ref ?–300)
Tricyclic, Ur Screen: NEGATIVE (ref ?–1000)

## 2012-02-09 LAB — ACETAMINOPHEN LEVEL: Acetaminophen: <2 ug/mL

## 2012-02-09 LAB — SALICYLATE LEVEL: Salicylates, Serum: 2.2 mg/dL

## 2012-04-23 ENCOUNTER — Emergency Department: Payer: Self-pay | Admitting: Emergency Medicine

## 2012-04-23 LAB — COMPREHENSIVE METABOLIC PANEL
Albumin: 4 g/dL (ref 3.4–5.0)
BUN: 23 mg/dL — ABNORMAL HIGH (ref 7–18)
Bilirubin,Total: 0.4 mg/dL (ref 0.2–1.0)
Chloride: 105 mmol/L (ref 98–107)
Creatinine: 1.03 mg/dL (ref 0.60–1.30)
EGFR (Non-African Amer.): >60
Potassium: 3.9 mmol/L (ref 3.5–5.1)
SGPT (ALT): 43 U/L (ref 12–78)
Total Protein: 7.3 g/dL (ref 6.4–8.2)

## 2012-04-23 LAB — CBC
HCT: 43.4 % (ref 40.0–52.0)
MCHC: 33.9 g/dL (ref 32.0–36.0)
MCV: 97 fL (ref 80–100)
Platelet: 159 10*3/uL (ref 150–440)
RDW: 13.1 % (ref 11.5–14.5)
WBC: 8.6 10*3/uL (ref 3.8–10.6)

## 2012-04-24 LAB — URINALYSIS, COMPLETE
Bacteria: NONE SEEN
Bilirubin,UR: NEGATIVE
Blood: NEGATIVE
Ketone: NEGATIVE
Ph: 5 (ref 4.5–8.0)
Protein: NEGATIVE
RBC,UR: NONE SEEN /HPF (ref 0–5)
Specific Gravity: 1.025 (ref 1.003–1.030)
Squamous Epithelial: <1

## 2012-10-28 ENCOUNTER — Emergency Department (HOSPITAL_COMMUNITY): Payer: Medicaid Other

## 2012-10-28 ENCOUNTER — Encounter (HOSPITAL_COMMUNITY): Payer: Self-pay | Admitting: Family Medicine

## 2012-10-28 ENCOUNTER — Inpatient Hospital Stay (HOSPITAL_COMMUNITY): Payer: Medicaid Other

## 2012-10-28 ENCOUNTER — Inpatient Hospital Stay (HOSPITAL_COMMUNITY)
Admission: EM | Admit: 2012-10-28 | Discharge: 2012-11-02 | DRG: 190 | Disposition: A | Discharge status: Home / Self Care | Payer: Medicaid Other | Attending: Internal Medicine | Admitting: Internal Medicine

## 2012-10-28 DIAGNOSIS — Z91199 Patient's noncompliance with other medical treatment and regimen due to unspecified reason: Secondary | ICD-10-CM

## 2012-10-28 DIAGNOSIS — R51 Headache: Secondary | ICD-10-CM | POA: Diagnosis present

## 2012-10-28 DIAGNOSIS — M2550 Pain in unspecified joint: Secondary | ICD-10-CM | POA: Diagnosis present

## 2012-10-28 DIAGNOSIS — Z9119 Patient's noncompliance with other medical treatment and regimen: Secondary | ICD-10-CM

## 2012-10-28 DIAGNOSIS — B370 Candidal stomatitis: Secondary | ICD-10-CM | POA: Diagnosis present

## 2012-10-28 DIAGNOSIS — J9620 Acute and chronic respiratory failure, unspecified whether with hypoxia or hypercapnia: Secondary | ICD-10-CM | POA: Diagnosis present

## 2012-10-28 DIAGNOSIS — Z77090 Contact with and (suspected) exposure to asbestos: Secondary | ICD-10-CM

## 2012-10-28 DIAGNOSIS — J441 Chronic obstructive pulmonary disease with (acute) exacerbation: Principal | ICD-10-CM | POA: Diagnosis present

## 2012-10-28 DIAGNOSIS — E2740 Unspecified adrenocortical insufficiency: Secondary | ICD-10-CM | POA: Diagnosis present

## 2012-10-28 DIAGNOSIS — IMO0001 Reserved for inherently not codable concepts without codable children: Secondary | ICD-10-CM | POA: Diagnosis present

## 2012-10-28 DIAGNOSIS — F419 Anxiety disorder, unspecified: Secondary | ICD-10-CM | POA: Diagnosis present

## 2012-10-28 DIAGNOSIS — D352 Benign neoplasm of pituitary gland: Secondary | ICD-10-CM | POA: Diagnosis present

## 2012-10-28 DIAGNOSIS — F319 Bipolar disorder, unspecified: Secondary | ICD-10-CM | POA: Diagnosis present

## 2012-10-28 DIAGNOSIS — I959 Hypotension, unspecified: Secondary | ICD-10-CM | POA: Diagnosis present

## 2012-10-28 DIAGNOSIS — IMO0002 Reserved for concepts with insufficient information to code with codable children: Secondary | ICD-10-CM

## 2012-10-28 DIAGNOSIS — J189 Pneumonia, unspecified organism: Secondary | ICD-10-CM

## 2012-10-28 DIAGNOSIS — E272 Addisonian crisis: Secondary | ICD-10-CM | POA: Diagnosis present

## 2012-10-28 DIAGNOSIS — F411 Generalized anxiety disorder: Secondary | ICD-10-CM

## 2012-10-28 DIAGNOSIS — D353 Benign neoplasm of craniopharyngeal duct: Secondary | ICD-10-CM | POA: Diagnosis present

## 2012-10-28 DIAGNOSIS — R1310 Dysphagia, unspecified: Secondary | ICD-10-CM | POA: Diagnosis present

## 2012-10-28 DIAGNOSIS — E2749 Other adrenocortical insufficiency: Secondary | ICD-10-CM | POA: Diagnosis present

## 2012-10-28 DIAGNOSIS — Z87891 Personal history of nicotine dependence: Secondary | ICD-10-CM

## 2012-10-28 DIAGNOSIS — T380X5A Adverse effect of glucocorticoids and synthetic analogues, initial encounter: Secondary | ICD-10-CM | POA: Diagnosis present

## 2012-10-28 HISTORY — DX: Chronic obstructive pulmonary disease, unspecified: J44.9

## 2012-10-28 HISTORY — DX: Shortness of breath: R06.02

## 2012-10-28 HISTORY — DX: Polyneuropathy, unspecified: G62.9

## 2012-10-28 HISTORY — DX: Mental disorder, not otherwise specified: F99

## 2012-10-28 LAB — CBC WITH DIFFERENTIAL/PLATELET
Basophils Absolute: 0 10*3/uL (ref 0.0–0.1)
Basophils Relative: 1 % (ref 0–1)
Eosinophils Absolute: 0.1 10*3/uL (ref 0.0–0.7)
Eosinophils Relative: 2 % (ref 0–5)
HCT: 34.1 % — ABNORMAL LOW (ref 39.0–52.0)
Hemoglobin: 11.5 g/dL — ABNORMAL LOW (ref 13.0–17.0)
Lymphocytes Relative: 14 % (ref 12–46)
Lymphs Abs: 0.9 10*3/uL (ref 0.7–4.0)
MCH: 30.7 pg (ref 26.0–34.0)
MCHC: 33.7 g/dL (ref 30.0–36.0)
MCV: 90.9 fL (ref 78.0–100.0)
Monocytes Absolute: 0.6 10*3/uL (ref 0.1–1.0)
Monocytes Relative: 9 % (ref 3–12)
Neutro Abs: 4.9 10*3/uL (ref 1.7–7.7)
Neutrophils Relative %: 74 % (ref 43–77)
Platelets: 95 10*3/uL — ABNORMAL LOW (ref 150–400)
RBC: 3.75 MIL/uL — ABNORMAL LOW (ref 4.22–5.81)
RDW: 13.9 % (ref 11.5–15.5)
WBC: 6.6 10*3/uL (ref 4.0–10.5)

## 2012-10-28 LAB — BASIC METABOLIC PANEL
BUN: 18 mg/dL (ref 6–23)
CO2: 31 mEq/L (ref 19–32)
Calcium: 8.5 mg/dL (ref 8.4–10.5)
Chloride: 100 mEq/L (ref 96–112)
Creatinine, Ser: 1.25 mg/dL (ref 0.50–1.35)
GFR calc Af Amer: 72 mL/min — ABNORMAL LOW (ref >90)
GFR calc non Af Amer: 62 mL/min — ABNORMAL LOW (ref >90)
Glucose, Bld: 104 mg/dL — ABNORMAL HIGH (ref 70–99)
Potassium: 4.4 mEq/L (ref 3.5–5.1)
Sodium: 137 mEq/L (ref 135–145)

## 2012-10-28 LAB — HEPATIC FUNCTION PANEL
Alkaline Phosphatase: 69 U/L (ref 39–117)
Total Bilirubin: 0.2 mg/dL — ABNORMAL LOW (ref 0.3–1.2)
Total Protein: 6 g/dL (ref 6.0–8.3)

## 2012-10-28 LAB — LACTIC ACID, PLASMA: Lactic Acid, Venous: 1.6 mmol/L (ref 0.5–2.2)

## 2012-10-28 LAB — POCT I-STAT 3, ART BLOOD GAS (G3+)
Acid-Base Excess: 3 mmol/L — ABNORMAL HIGH (ref 0.0–2.0)
Bicarbonate: 29.2 mEq/L — ABNORMAL HIGH (ref 20.0–24.0)
pCO2 arterial: 53.8 mmHg — ABNORMAL HIGH (ref 35.0–45.0)
pH, Arterial: 7.346 — ABNORMAL LOW (ref 7.350–7.450)
pO2, Arterial: 136 mmHg — ABNORMAL HIGH (ref 80.0–100.0)

## 2012-10-28 LAB — INFLUENZA PANEL BY PCR (TYPE A & B)
Influenza A By PCR: NEGATIVE
Influenza B By PCR: NEGATIVE

## 2012-10-28 MED ORDER — SODIUM CHLORIDE 0.9 % IV BOLUS (SEPSIS)
2000.0000 mL | Freq: Once | INTRAVENOUS | Status: AC
  Administered 2012-10-28: 1000 mL via INTRAVENOUS

## 2012-10-28 MED ORDER — ONDANSETRON HCL 4 MG PO TABS: 4.0000 mg | ORAL_TABLET | Freq: Four times a day (QID) | ORAL | Status: DC | PRN

## 2012-10-28 MED ORDER — KETOROLAC TROMETHAMINE 30 MG/ML IJ SOLN
30.0000 mg | Freq: Once | INTRAMUSCULAR | Status: AC
  Administered 2012-10-28: 30 mg via INTRAVENOUS
  Filled 2012-10-28: qty 1

## 2012-10-28 MED ORDER — IOHEXOL 300 MG/ML  SOLN
80.0000 mL | Freq: Once | INTRAMUSCULAR | Status: AC | PRN
  Administered 2012-10-28: 80 mL via INTRAVENOUS

## 2012-10-28 MED ORDER — SODIUM CHLORIDE 0.9 % IV SOLN
INTRAVENOUS | Status: DC
  Administered 2012-10-28 – 2012-10-31 (×5): via INTRAVENOUS

## 2012-10-28 MED ORDER — MORPHINE SULFATE 4 MG/ML IJ SOLN
4.0000 mg | INTRAMUSCULAR | Status: DC | PRN
  Administered 2012-10-29 – 2012-11-02 (×22): 4 mg via INTRAVENOUS
  Filled 2012-10-28 (×23): qty 1

## 2012-10-28 MED ORDER — ALBUTEROL SULFATE (5 MG/ML) 0.5% IN NEBU
5.0000 mg | INHALATION_SOLUTION | Freq: Once | RESPIRATORY_TRACT | Status: AC
  Administered 2012-10-28: 5 mg via RESPIRATORY_TRACT
  Filled 2012-10-28: qty 1

## 2012-10-28 MED ORDER — METHYLPREDNISOLONE SODIUM SUCC 125 MG IJ SOLR
125.0000 mg | Freq: Four times a day (QID) | INTRAMUSCULAR | Status: DC
  Administered 2012-10-28 – 2012-10-29 (×4): 125 mg via INTRAVENOUS
  Filled 2012-10-28 (×6): qty 2

## 2012-10-28 MED ORDER — SODIUM CHLORIDE 0.9 % IV BOLUS (SEPSIS)
1000.0000 mL | Freq: Once | INTRAVENOUS | Status: AC
  Administered 2012-10-28: 1000 mL via INTRAVENOUS

## 2012-10-28 MED ORDER — SODIUM CHLORIDE 0.9 % IV SOLN
INTRAVENOUS | Status: DC
  Administered 2012-10-28: 17:00:00 via INTRAVENOUS

## 2012-10-28 MED ORDER — HYDROCOD POLST-CHLORPHEN POLST 10-8 MG/5ML PO LQCR
5.0000 mL | Freq: Two times a day (BID) | ORAL | Status: DC | PRN
  Administered 2012-10-29 – 2012-10-31 (×5): 5 mL via ORAL
  Filled 2012-10-28 (×9): qty 5

## 2012-10-28 MED ORDER — MORPHINE SULFATE 2 MG/ML IJ SOLN: 2.0000 mg | INTRAMUSCULAR | Status: DC | PRN

## 2012-10-28 MED ORDER — LEVOFLOXACIN IN D5W 750 MG/150ML IV SOLN
750.0000 mg | INTRAVENOUS | Status: DC
  Administered 2012-10-28 – 2012-10-29 (×2): 750 mg via INTRAVENOUS
  Filled 2012-10-28 (×4): qty 150

## 2012-10-28 MED ORDER — SODIUM CHLORIDE 0.9 % IJ SOLN
3.0000 mL | Freq: Two times a day (BID) | INTRAMUSCULAR | Status: DC
  Administered 2012-10-29 – 2012-11-02 (×5): 3 mL via INTRAVENOUS

## 2012-10-28 MED ORDER — MORPHINE SULFATE 4 MG/ML IJ SOLN
4.0000 mg | Freq: Once | INTRAMUSCULAR | Status: AC
  Administered 2012-10-28: 4 mg via INTRAVENOUS
  Filled 2012-10-28: qty 1

## 2012-10-28 MED ORDER — IPRATROPIUM BROMIDE 0.02 % IN SOLN
0.5000 mg | Freq: Once | RESPIRATORY_TRACT | Status: AC
  Administered 2012-10-28: 0.5 mg via RESPIRATORY_TRACT
  Filled 2012-10-28: qty 2.5

## 2012-10-28 MED ORDER — IPRATROPIUM BROMIDE 0.02 % IN SOLN
0.5000 mg | RESPIRATORY_TRACT | Status: DC
  Administered 2012-10-28 – 2012-10-31 (×14): 0.5 mg via RESPIRATORY_TRACT
  Filled 2012-10-28 (×14): qty 2.5

## 2012-10-28 MED ORDER — GUAIFENESIN-DM 100-10 MG/5ML PO SYRP
5.0000 mL | ORAL_SOLUTION | ORAL | Status: DC | PRN
  Administered 2012-10-28 – 2012-11-02 (×5): 5 mL via ORAL
  Filled 2012-10-28 (×6): qty 5

## 2012-10-28 MED ORDER — ONDANSETRON HCL 4 MG/2ML IJ SOLN: 4.0000 mg | Freq: Four times a day (QID) | INTRAMUSCULAR | Status: DC | PRN

## 2012-10-28 MED ORDER — ALBUTEROL (5 MG/ML) CONTINUOUS INHALATION SOLN
10.0000 mg/h | INHALATION_SOLUTION | RESPIRATORY_TRACT | Status: AC
  Administered 2012-10-28: 10 mg/h via RESPIRATORY_TRACT
  Filled 2012-10-28: qty 20

## 2012-10-28 MED ORDER — GADOBENATE DIMEGLUMINE 529 MG/ML IV SOLN
20.0000 mL | Freq: Once | INTRAVENOUS | Status: AC | PRN
  Administered 2012-10-28: 20 mL via INTRAVENOUS

## 2012-10-28 MED ORDER — ALBUTEROL SULFATE (5 MG/ML) 0.5% IN NEBU
2.5000 mg | INHALATION_SOLUTION | RESPIRATORY_TRACT | Status: DC
  Administered 2012-10-28 – 2012-10-31 (×14): 2.5 mg via RESPIRATORY_TRACT
  Filled 2012-10-28 (×14): qty 0.5

## 2012-10-28 MED ORDER — METHYLPREDNISOLONE SODIUM SUCC 125 MG IJ SOLR
125.0000 mg | INTRAMUSCULAR | Status: AC
  Administered 2012-10-28: 125 mg via INTRAVENOUS
  Filled 2012-10-28: qty 2

## 2012-10-28 MED ORDER — MORPHINE SULFATE 2 MG/ML IJ SOLN
INTRAMUSCULAR | Status: AC
  Administered 2012-10-28: 2 mg via INTRAVENOUS
  Filled 2012-10-28: qty 1

## 2012-10-28 NOTE — ED Notes (Signed)
Pt remains in CT and MRI.

## 2012-10-28 NOTE — H&P (Addendum)
Hospital Admission Note Date: 10/29/2012  Patient name: Steven Wiley Medical record number: [id number] Date of birth: [birth year] Age: 57 y.o. Gender: male PCP: No primary provider on file.  Medical Service: IMTS  Attending physician:  Dr. Eben Burow    1st Contact: Dr. Burtis Junes   Pager: [phone] 2nd Contact: Dr. Dierdre Searles    Pager: [phone] After 5 pm or weekends: 1st Contact:      Pager: [phone] 2nd Contact:      Pager: [phone]  Chief Complaint: SOB, worst HA of life, intense sudden myalgia/arthralgia for past 4 days  History of Present Illness: Steven Wiley 57 yo man with pmh of adrenal insufficiency not currently on steroids, COPD, pituitary microadenoma, and bipolar disorder. 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 Pt is currently unemployed and was a heavy Etoh user and smoker but has had sobriety for 20+ yrs and 2 month break from smoking. Pt then had some sick contact including a landlord with URI symptoms that visited and coughed on the patient. Pt also doesn't wipe down exercise equipment at public gym. Pt has had severe myalgias and arthralgia, measured temperature 103F at home, sudden severe "worse HA of my life" associated with peripheral vision loss, chronic cough, night sweats, and SOB. Pt lives with girl friend. Pt has had some decreased UO and BM. No skin rashes or hyper/hypopignmented spots on skin. No fainting, LOC, or CP. Pt has been hospitalized for work related traumatic injury included femur break replaced with rod and back surgery. No significant family hx. Father died of colon cancer, mother of dementia, and brothers and sister healthy. No hx of AI disease, DM, other cancers, or pituitary adenomas. Pt was incarcerated many years ago 10+ for DWI w/o extended stay and has never been homeless, but pt has hx of bipolar disorder with risky behaviors and behavioral health admissions for treatment. Pt only meds at home are  prn nebs and oxycodone for back pain.    Meds: No current outpatient prescriptions on file.  Allergies: Allergies as of 10/28/2012 - Review Complete 10/28/2012  Allergen Reaction Noted  . Acetaminophen  10/28/2012  . Penicillins  10/28/2012   Past Medical History  Diagnosis Date  . Neuropathy   . Mental disorder   . COPD (chronic obstructive pulmonary disease)   . Shortness of breath    Past Surgical History  Procedure Laterality Date  . Back surgery     History reviewed. No pertinent family history. History   Social History  . Marital Status: Divorced    Spouse Name: N/A    Number of Children: N/A  . Years of Education: N/A   Occupational History  . Not on file.   Social History Main Topics  . Smoking status: Former Smoker    Types: Cigarettes  . Smokeless tobacco: Former Neurosurgeon    Quit date: 10/14/2012  . Alcohol Use: No  . Drug Use: No  . Sexually Active: Not on file   Other Topics Concern  . Not on file   Social History Narrative  . No narrative on file    Review of Systems: Pertinent items are noted in HPI.  Physical Exam: Blood pressure 147/86, pulse 73, temperature 97.9 F (36.6 C), temperature source Oral, resp. rate 19, height 5\' 10"  (1.778 m), weight 201 lb 11.5 oz (91.5 kg), SpO2 98.00%. General: resting in bed, acute distress, diaphoretic  HEENT: PERRL, EOMI, no scleral icterus, bitemporal heminopsia, no  sinus tenderness to palpation Cardiac: RRR, no rubs, murmurs or gallops Pulm: on nebulizer, extensive wheezes bilaterally, decreased lung volumes, accessory abdominal muscle and scalene muscle use Abd: soft, nontender, nondistended, BS present Ext: warm and well perfused, no pedal edema, no rashes, no hyper/hypopigmented spots Neuro: alert and oriented X3, cranial nerves II-XII grossly intact   Lab results: Basic Metabolic Panel:  Recent Labs  16/10/96 1416 10/29/12 0435  NA 137 140  K 4.4 4.1  CL 100 104  CO2 31 28  GLUCOSE 104*  180*  BUN 18 17  CREATININE 1.25 0.93  CALCIUM 8.5 8.4   CBC:  Recent Labs  10/28/12 1416 10/29/12 0435  WBC 6.6 4.5  NEUTROABS 4.9  --   HGB 11.5* 11.4*  HCT 34.1* 34.7*  MCV 90.9 91.8  PLT 95* 97*   Urine Drug Screen: Drugs of Abuse     Component Value Date/Time   LABOPIA NONE DETECTED 05/21/2009 0004   COCAINSCRNUR NONE DETECTED 05/21/2009 0004   LABBENZ POSITIVE* 05/21/2009 0004   AMPHETMU NONE DETECTED 05/21/2009 0004   THCU NONE DETECTED 05/21/2009 0004   LABBARB  Value: NONE DETECTED        DRUG SCREEN FOR MEDICAL PURPOSES ONLY.  IF CONFIRMATION IS NEEDED FOR ANY PURPOSE, NOTIFY LAB WITHIN 5 DAYS.        LOWEST DETECTABLE LIMITS FOR URINE DRUG SCREEN Drug Class       Cutoff (ng/mL) Amphetamine      1000 Barbiturate      200 Benzodiazepine   200 Tricyclics       300 Opiates          300 Cocaine          300 THC              50 05/21/2009 0004   Imaging results:  Dg Chest 2 View  10/28/2012  **ADDENDUM** CREATED: 10/28/2012 15:26:32  Findings discussed with Dr. Lynelle Doctor on 10/28/2012 at 3:29 p.m.  **END ADDENDUM** SIGNED BY: Richarda Overlie, M.D.   10/28/2012  *RADIOLOGY REPORT*  Clinical Data: Cough and shortness of breath.  CHEST - 2 VIEW  Comparison: 05/20/2009  Findings: Two views of the chest demonstrate mild hyperinflation. There are densities in the right lung apex which could be related to scarring.  However, these densities  have progressed and there is concern for a 1.3 cm nodular structure in this area. Interstitial markings are slightly prominent and may represent chronic changes.  Heart size is stable.  IMPRESSION: Right apical densities have progressed since the prior examination.  There may also be a nodular lesion which measures up to 1.3 cm.  Recommend further characterization with a chest CT.  Mild haziness in the lungs that could represent chronic changes.  Original Report Authenticated By: Richarda Overlie, M.D.    Ct Chest W Contrast  10/28/2012  *RADIOLOGY REPORT*  Clinical  Data: Shortness of breath with cough, fever and chills. Possible right apical nodularity on radiographs.  CT CHEST WITH CONTRAST  Technique:  Multidetector CT imaging of the chest was performed following the standard protocol during bolus administration of intravenous contrast.  Contrast: 80mL OMNIPAQUE IOHEXOL 300 MG/ML  SOLN  Comparison: Prior chest radiographs 05/20/2009 and 10/28/2012. Cervical spine CT 02/13/2009.  Findings: There are partially calcified right hilar and subcarinal lymph nodes.  Noncalcified component of a subcarinal node measures 1.3 cm short axis on image 28.  There are mildly prominent non calcified precarinal nodes measuring up to 12 mm short  axis.  There is no axillary adenopathy.  Contrast bolus is suboptimal.  There is central enlargement the pulmonary arteries consistent with pulmonary arterial hypertension. There is no evidence for pulmonary embolism on this non dedicated study.  There is no significant atherosclerosis.  A small amount of pleural thickening or pleural fluid is present on the right.  There is no left pleural effusion or pericardial effusion.  There is extensive chronic lung disease with emphysema and paraseptal bleb formation.  The asymmetric density at the right apex noted on radiographs appears to most likely represent apical scarring, progressed from prior radiographs, but without focal mass or adjacent chest wall abnormality. There are scattered nodular densities with a tree-in-bud distribution, especially in the upper lobes.  There is a linear scarring atelectasis in the right lower lobe.  There is a 7 mm focal ground-glass opacity in the right middle lobe on image 29.  No dominant mass or endobronchial lesion is seen.  The visualized upper abdomen appears unremarkable.  There are no worrisome osseous findings.  IMPRESSION:  1.  No specific evidence of thoracic malignancy.  The asymmetric right apical density has an appearance most consistent with scarring. 2.   Extensive chronic lung disease with emphysema and additional areas of scarring.  Peribronchial nodularity has a tree-in-bud distribution and it is likely postinflammatory.  This can be seen with typical and atypical mycobacterial infection. 3.  Nonspecific prominent mediastinal and right hilar lymph nodes, some partially calcified.  This is likely related to prior granulomatous infection. 4.  Nonspecific small ground-glass opacity in the right middle lobe. Consider follow-up by chest CT without contrast in 3-6 months to confirm persistence.   This recommendation follows the consensus statement: Recommendations for the Management of Subsolid Pulmonary Nodules Detected at CT:  A Statement from the Fleischner Society as published in Radiology 2013; 266:304-317. 5.  Sternal nonunion.   Original Report Authenticated By: Carey Bullocks, M.D.    Mr Laqueta Jean JY Contrast  10/28/2012  *RADIOLOGY REPORT*  Clinical Data: Pituitary abnormality.  Headache.  Question pituitary apoplexy?  MRI HEAD WITHOUT AND WITH CONTRAST  Technique:  Multiplanar, multiecho pulse sequences of the brain and surrounding structures were obtained according to standard protocol without and with intravenous contrast  Contrast: 20mL MULTIHANCE GADOBENATE DIMEGLUMINE 529 MG/ML IV SOLN  Comparison: 09/24/2010.  Findings: The patient had a cough and images are motion degraded.  No acute infarct.  No evidence of pituitary apoplexy.  Heterogeneous appearance of the pituitary gland.  Difficult to confirm or exclude a tiny microadenoma.  Scattered nonspecific white matter type changes similar to the prior exam.  This may be related to result of small vessel disease.  No MR evidence of intracranial hemorrhage.  No intracranial mass or abnormal enhancement.  Mild atrophy without hydrocephalus.  Major intracranial vascular structures are patent.  Paranasal sinus mucosal thickening.  Partial opacification ethmoid sinus air cells.  Cervical medullary junction,  pineal region and orbital structures unremarkable.  IMPRESSION: The patient had a cough and images are motion degraded.  No acute infarct.  No evidence of pituitary apoplexy.  Heterogeneous appearance of the pituitary gland.  Difficult to confirm or exclude a tiny microadenoma.  Scattered nonspecific white matter type changes similar to the prior exam.  This may be related to result of small vessel disease.  No MR evidence of intracranial hemorrhage.  No intracranial mass or abnormal enhancement.  Paranasal sinus mucosal thickening.  Partial opacification ethmoid sinus air cells.   Original Report Authenticated  By: Lacy Duverney, M.D.     Other results: EKG: normal EKG, normal sinus rhythm, there are no previous tracings available for comparison, normal sinus rhythm.  Assessment & Plan by Problem: 1. Adrenal crisis: Pt known hx of pituitary adenoma and adrenal insufficiency currently not on steroids for several years. Presented to ED hypotensive and continues to be hypotensive with fluids. Pt not tachycardic in setting of acute illness. Pt w/o electrolyte disturbance but abdominal pain and myalgia/arthralgia. Pt had similar presentation about 4 yrs ago with adrenal insufficiency.  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 Sharl Ma. No hx of gamma knife procedure or surgery. Sudden severe HA with associated vision loss.  -stat MRI if confirmed will need to call neurosurgery  3. COPD exacerbation: Pt with sputum production , cough and SOB for past 5 days. Pt with nebs at home but did not use before presentation to ED. CXR concerning mass in apical region with calcifications. Some Concern for TB.  -IV levofloxacin -steroids as #1 -scheduled duo nebs  -stat CT scan prelim showed  atypical presentation can't r/o TB infection this was confirmed with discussion with radiologist -Dr. Drue Second with ID was consulted and CT prelim report was given in regards to possible acute TB infection recs below:  -AFB sputum x3 -negative pressure room until sputum can be collected  Dispo: Disposition is deferred at this time, awaiting improvement of current medical problems. Anticipated discharge in approximately 3-4 day(s).   The patient does have a current PCP who is in [place], [place](No primary provider on file.), therefore will be requiring [facility] follow-up after discharge.   The patient does not have transportation limitations that hinder transportation to clinic appointments.  Signed: Christen Bame, MD PGY-1 Pgr: [phone] 10/29/2012, 11:35 AM     Internal Medicine Attending Admission Note Date: 10/29/2012  Patient name: Steven Wiley Medical record number: [id number] Date of birth: 1955-08-30 Age: 57 y.o. Gender: male  I saw and evaluated the patient. I reviewed the resident's note and I agree with the resident's findings and plan as documented in the resident's note.  Chief Complaint(s):shortness of breath  History - key components related to admission:patient is a 57 year old man with past medical history most significant for COPD, pituitary microadenoma and bipolar disorder who presents with 4 days of shortness of breath, body aches and headache. Patient was apparently well 4 days ago when he had a sick contact with his landlord. Patient developed fevers as high as 103 Fahrenheit which are associated with severe body aches and joint pains and headaches. Patient has chronic cough and shortness of breath at baseline which has worsened over last 4 days.patient denies any chest pain, palpitations, swelling, recent traumatic injury, dizziness, skin lesions, weight loss. Patient denies any TB exposure. He endorses weight gain over last few months and denies blood in sputum.  Patient c/o cough  and continued epigastric pain. Requests cough suppresants.    15 point review of system is negative except for as noted above.  Past medical history, past surgical history, medications, social history and family history was reviewed as per resident's note.   Physical Exam - key components related to admission:  Filed Vitals:   10/29/12 0344 10/29/12 0400 10/29/12 0809 10/29/12 0904  BP:  125/74 147/86   Pulse:  69 73   Temp:  97.8 F (36.6 C) 97.9 F (36.6 C)   TempSrc:  Oral Oral   Resp:  17 19   Height:      Weight:      SpO2: 93% 94% 92% 98%  Physical Exam: General: Vital signs reviewed and noted. Well-developed, well-nourished, in no acute distress; alert, appropriate and cooperative throughout examination.  Head: Normocephalic, atraumatic.  Eyes: PERRL, EOMI, No signs of anemia or jaundince.  Nose: Mucous membranes moist, not inflammed, nonerythematous.  Throat: Oropharynx nonerythematous, no exudate appreciated.   Neck: No deformities, masses, or tenderness noted.Supple, No carotid Bruits, no JVD.  Lungs:  Diffuse wheezing throughout bilateral lung fields expiratory>inspiratory  Heart: RRR. S1 and S2 normal without gallop, murmur, or rubs.  Abdomen:  BS normoactive. Soft, Nondistended, non-tender.  No masses or organomegaly.  Extremities: No pretibial edema.  Neurologic: A&O X3, CN II - XII are grossly intact. Motor strength is 5/5 in the all 4 extremities, Sensations intact to light touch, Cerebellar signs negative.  Skin: No visible rashes, scars.    Lab results:   Basic Metabolic Panel:  Recent Labs  16/10/96 1416 10/29/12 0435  NA 137 140  K 4.4 4.1  CL 100 104  CO2 31 28  GLUCOSE 104* 180*  BUN 18 17  CREATININE 1.25 0.93  CALCIUM 8.5 8.4   Liver Function Tests:  Recent Labs  10/28/12 1933  AST 23  ALT 16  ALKPHOS 69  BILITOT 0.2*  PROT 6.0  ALBUMIN 3.0*     Recent Labs  10/28/12 1416 10/29/12 0435  WBC 6.6 4.5  NEUTROABS 4.9  --    HGB 11.5* 11.4*  HCT 34.1* 34.7*  MCV 90.9 91.8  PLT 95* 97*    Recent Labs  10/29/12 0435  INR 1.18   Imaging results:  Dg Chest 2 View  10/28/2012  **ADDENDUM** CREATED: 10/28/2012 15:26:32  Findings discussed with Dr. Lynelle Doctor on 10/28/2012 at 3:29 p.m.  **END ADDENDUM** SIGNED BY: Richarda Overlie, M.D.   10/28/2012  *RADIOLOGY REPORT*  Clinical Data: Cough and shortness of breath.  CHEST - 2 VIEW  Comparison: 05/20/2009  Findings: Two views of the chest demonstrate mild hyperinflation. There are densities in the right lung apex which could be related to scarring.  However, these densities  have progressed and there is concern for a 1.3 cm nodular structure in this area. Interstitial markings are slightly prominent and may represent chronic changes.  Heart size is stable.  IMPRESSION: Right apical densities have progressed since the prior examination.  There may also be a nodular lesion which measures up to 1.3 cm.  Recommend further characterization with a chest CT.  Mild haziness in the lungs that could represent chronic changes.  Original Report Authenticated By: Richarda Overlie, M.D.    Ct Chest W Contrast  10/28/2012  *RADIOLOGY REPORT*  Clinical Data: Shortness of breath with cough, fever and chills. Possible right apical nodularity on radiographs.  CT CHEST WITH CONTRAST  Technique:  Multidetector CT imaging of the chest was performed following the standard protocol during bolus administration of intravenous contrast.  Contrast: 80mL OMNIPAQUE IOHEXOL 300 MG/ML  SOLN  Comparison: Prior chest radiographs 05/20/2009 and 10/28/2012. Cervical spine CT 02/13/2009.  Findings: There are partially calcified right hilar and subcarinal lymph nodes.  Noncalcified component of a subcarinal node measures 1.3 cm short axis on image 28.  There are mildly prominent non calcified precarinal nodes measuring up to 12 mm short axis.  There is no axillary adenopathy.  Contrast bolus is suboptimal.  There is central enlargement  the pulmonary arteries consistent with pulmonary arterial hypertension. There is no evidence for pulmonary embolism on this non dedicated study.  There is no significant atherosclerosis.  A small amount of pleural thickening or pleural fluid is present on the right.  There is no left pleural effusion or pericardial effusion.  There is extensive chronic lung disease with emphysema and paraseptal bleb formation.  The asymmetric density at the right apex noted on radiographs appears to most likely represent apical scarring, progressed from prior radiographs, but without focal mass or adjacent chest wall abnormality. There are scattered nodular densities with a tree-in-bud distribution, especially in the upper lobes.  There is a linear scarring atelectasis in the right lower lobe.  There is a 7 mm focal ground-glass opacity in the right middle lobe on image 29.  No dominant mass or endobronchial lesion is seen.  The visualized upper abdomen appears unremarkable.  There are no worrisome osseous findings.  IMPRESSION:  1.  No specific evidence of thoracic malignancy.  The asymmetric right apical density has an appearance most consistent with scarring. 2.  Extensive chronic lung disease with emphysema and additional areas of scarring.  Peribronchial nodularity has a tree-in-bud distribution and it is likely postinflammatory.  This can be seen with typical and atypical mycobacterial infection. 3.  Nonspecific prominent mediastinal and right hilar lymph nodes, some partially calcified.  This is likely related to prior granulomatous infection. 4.  Nonspecific small ground-glass opacity in the right middle lobe. Consider follow-up by chest CT without contrast in 3-6 months to confirm persistence.   This recommendation follows the consensus statement: Recommendations for the Management of Subsolid Pulmonary Nodules Detected at CT:  A Statement from the Fleischner Society as published in Radiology 2013; 266:304-317. 5.  Sternal  nonunion.   Original Report Authenticated By: Carey Bullocks, M.D.    Mr Laqueta Jean EA Contrast  10/28/2012  *RADIOLOGY REPORT*  Clinical Data: Pituitary abnormality.  Headache.  Question pituitary apoplexy?  MRI HEAD WITHOUT AND WITH CONTRAST  Technique:  Multiplanar, multiecho pulse sequences of the brain and surrounding structures were obtained according to standard protocol without and with intravenous contrast  Contrast: 20mL MULTIHANCE GADOBENATE DIMEGLUMINE 529 MG/ML IV SOLN  Comparison: 09/24/2010.  Findings: The patient had a cough and images are motion degraded.  No acute infarct.  No evidence of pituitary apoplexy.  Heterogeneous appearance of the pituitary gland.  Difficult to confirm or exclude a tiny microadenoma.  Scattered nonspecific white matter type changes similar to the prior exam.  This may be related to result of small vessel disease.  No MR evidence of intracranial hemorrhage.  No intracranial mass or abnormal enhancement.  Mild atrophy without hydrocephalus.  Major intracranial vascular structures are patent.  Paranasal sinus mucosal thickening.  Partial opacification ethmoid sinus air cells.  Cervical medullary junction, pineal region and orbital structures unremarkable.  IMPRESSION: The patient had a cough and images are motion degraded.  No acute infarct.  No evidence of pituitary apoplexy.  Heterogeneous appearance of the pituitary gland.  Difficult to confirm or exclude a tiny microadenoma.  Scattered nonspecific white matter type changes similar to the prior exam.  This may be related to result of small vessel disease.  No MR evidence of intracranial hemorrhage.  No intracranial mass or abnormal enhancement.  Paranasal sinus mucosal thickening.  Partial opacification ethmoid sinus air cells.   Original Report Authenticated By: Viviann Spare  Constance Goltz, M.D.     Other results: EKG:81 becoming, normal axis, sinus rhythm, no ST or T wave changes noted  Assessment & Plan by Problem:  Principal  Problem:   COPD exacerbation Active Problems:   Adrenal insufficiency   Acute adrenal crisis   Pituitary adenoma   Anxiety disorder  57 year old man with past medical history as noted above is currently being admitted with shortness of breath for last 5 days which most likely concerning for COPD exacerbation.   -Patient has abnormal chest x-ray and CT scan. Radiologist read does not suggest any specific evidence of malignancy. Patient does have peribronchial nodularity which may be seen in typical and atypical mycobacterial infections. Patient has nonspecific prominent mediastinal and right hilar lymph nodes with some of them which are partially calcified and presents prior granulomatous infection. A followup CT chest is recommended in 3-6 months to followup on abnormal imaging. I agree in treatment of this patient as a primary COPD exacerbation patient. We will send a repeat sputum x3 and check TB quantiferon gold(although low on differential with negative B symptoms).   -Consider inpatient Pulm consult for evaluation of severe COPD and establish care -Start cough suppressant syrup while hospitalized -Ok to tranfer to floors and does not need tele -counseled on smoking cessation and agrees to quit   No treatment for TB is indicated at this time prior to establishment of diagnoses. Patient will also benefit from outpatient PFT.  Rest of the medical management as per resident notes.    Lars Mage MD Faculty-Internal Medicine Residency Program

## 2012-10-28 NOTE — ED Provider Notes (Signed)
History     CSN: [id number]  Arrival date & time 10/28/12  1346   First MD Initiated Contact with Patient 10/28/12 1359      Chief Complaint  Patient presents with  . Cough    (Consider location/radiation/quality/duration/timing/severity/associated sxs/prior treatment) HPI Patient is a 57 y.o male who presents to the emergency department complaining of cough.  Cough began four days ago and has become progressively worse.  Cough is now painful and produces thick, green sputum.  Cough is associated with sore throat, nasal and chest congestion, fever up to 103 degrees, body aches, chills, and sweats.  Patient quit smoking two weeks ago (20 pack year history) and denies history of COPD.  Patient had similar, yet less severe episode during the winter.  Patient denies hemoptysis and exertional chest pain.  Patient uses albuterol and ipratropium inhaler at home but did not use it prior to arrival.  Past Medical History  Diagnosis Date  . Neuropathy     Past Surgical History  Procedure Laterality Date  . Back surgery      History reviewed. No pertinent family history.  History  Substance Use Topics  . Smoking status: Former Games developer  . Smokeless tobacco: Not on file  . Alcohol Use: No      Review of Systems Review of systems as above, otherwise negative Allergies  Acetaminophen and Penicillins  Home Medications  No current outpatient prescriptions on file.  BP 92/62  Pulse 88  Temp(Src) 98 F (36.7 C) (Oral)  Resp 20  SpO2 90%  Physical Exam  Constitutional: He appears well-developed and well-nourished.  HENT:  Head: Normocephalic and atraumatic.  Right Ear: External ear normal.  Left Ear: External ear normal.  Mouth/Throat: Oropharynx is clear and moist.  Eyes: Conjunctivae are normal. Pupils are equal, round, and reactive to light.  Neck: Neck supple.  Cardiovascular: Normal rate and regular rhythm.  Exam reveals no gallop and no friction rub.   No murmur  heard. Pulmonary/Chest: No accessory muscle usage. No respiratory distress. He has wheezes. He has rhonchi. He exhibits no tenderness.    ED Course  Procedures (including critical care time)  Admission to the hospital due to his low pulse oximetry readings and continued difficulty with breathing and wheezing.  This is most likely a COPD exacerbation with hypoxemia.  I talked to the family practice resident, who will be admitting the patient to the hospital.  Patient does have some abnormality seen on the plain films that were CT scan.  Also ordered hour-long treatment, along with steroids.   MDM  MDM Reviewed: vitals and nursing note Interpretation: labs, ECG, x-ray and CT scan Consults: admitting MD    Date: 10/28/2012  Rate:81  Rhythm: normal sinus rhythm  QRS Axis: normal  Intervals: normal  ST/T Wave abnormalities: normal  Conduction Disutrbances:none  Narrative Interpretation:   Old EKG Reviewed: unchanged            Carlyle Dolly, PA-C 10/28/12 1600

## 2012-10-28 NOTE — ED Notes (Signed)
Per pt sts that he has had URI symptoms, cough, cold chills, fever and SOB.

## 2012-10-28 NOTE — ED Notes (Signed)
Patient transported to X-ray 

## 2012-10-29 ENCOUNTER — Encounter (HOSPITAL_COMMUNITY): Payer: Self-pay | Admitting: Infectious Diseases

## 2012-10-29 DIAGNOSIS — J189 Pneumonia, unspecified organism: Secondary | ICD-10-CM

## 2012-10-29 DIAGNOSIS — J9620 Acute and chronic respiratory failure, unspecified whether with hypoxia or hypercapnia: Secondary | ICD-10-CM | POA: Diagnosis present

## 2012-10-29 DIAGNOSIS — J441 Chronic obstructive pulmonary disease with (acute) exacerbation: Principal | ICD-10-CM

## 2012-10-29 LAB — EXPECTORATED SPUTUM ASSESSMENT W GRAM STAIN, RFLX TO RESP C

## 2012-10-29 LAB — BASIC METABOLIC PANEL
Calcium: 8.4 mg/dL (ref 8.4–10.5)
Chloride: 104 mEq/L (ref 96–112)
Creatinine, Ser: 0.93 mg/dL (ref 0.50–1.35)
GFR calc Af Amer: >90 mL/min (ref >90)
Sodium: 140 mEq/L (ref 135–145)

## 2012-10-29 LAB — CBC
Platelets: 97 10*3/uL — ABNORMAL LOW (ref 150–400)
RBC: 3.78 MIL/uL — ABNORMAL LOW (ref 4.22–5.81)
RDW: 14.1 % (ref 11.5–15.5)
WBC: 4.5 10*3/uL (ref 4.0–10.5)

## 2012-10-29 LAB — PROTIME-INR
INR: 1.18 (ref 0.00–1.49)
Prothrombin Time: 14.8 seconds (ref 11.6–15.2)

## 2012-10-29 LAB — TSH: TSH: 0.502 u[IU]/mL (ref 0.350–4.500)

## 2012-10-29 LAB — HIV ANTIBODY (ROUTINE TESTING W REFLEX): HIV: NONREACTIVE

## 2012-10-29 MED ORDER — FLUOXETINE HCL 20 MG PO CAPS
40.0000 mg | ORAL_CAPSULE | Freq: Every day | ORAL | Status: DC
  Administered 2012-10-29 – 2012-11-02 (×5): 40 mg via ORAL
  Filled 2012-10-29 (×5): qty 2

## 2012-10-29 MED ORDER — BISACODYL 10 MG RE SUPP
10.0000 mg | Freq: Once | RECTAL | Status: AC
  Administered 2012-10-29: 10 mg via RECTAL

## 2012-10-29 MED ORDER — GABAPENTIN 600 MG PO TABS
600.0000 mg | ORAL_TABLET | Freq: Three times a day (TID) | ORAL | Status: DC
  Administered 2012-10-29 – 2012-11-02 (×12): 600 mg via ORAL
  Filled 2012-10-29 (×14): qty 1

## 2012-10-29 MED ORDER — LORAZEPAM 2 MG/ML IJ SOLN
0.5000 mg | Freq: Four times a day (QID) | INTRAMUSCULAR | Status: DC | PRN
  Administered 2012-10-31 – 2012-11-01 (×4): 0.5 mg via INTRAVENOUS
  Filled 2012-10-29 (×4): qty 1

## 2012-10-29 MED ORDER — METHYLPREDNISOLONE SODIUM SUCC 40 MG IJ SOLR
40.0000 mg | Freq: Three times a day (TID) | INTRAMUSCULAR | Status: DC
  Administered 2012-10-29 – 2012-10-30 (×2): 40 mg via INTRAVENOUS
  Filled 2012-10-29 (×5): qty 1

## 2012-10-29 NOTE — Progress Notes (Signed)
Utilization review completed.  

## 2012-10-29 NOTE — Progress Notes (Signed)
Subjective: Pt doing better overall this AM. Still having large amounts of sputum. Pt very anxious and worried about all symptoms- "stating i have cancer" pt does have a hx of anxiety and bipolar but denies ever being diagnosed this AM.   Objective: Vital signs in last 24 hours: Filed Vitals:   10/29/12 0344 10/29/12 0400 10/29/12 0809 10/29/12 0904  BP:  125/74 147/86   Pulse:  69 73   Temp:  97.8 F (36.6 C) 97.9 F (36.6 C)   TempSrc:  Oral Oral   Resp:  17 19   Height:      Weight:      SpO2: 93% 94% 92% 98%   Weight change:   Intake/Output Summary (Last 24 hours) at 10/29/12 1117 Last data filed at 10/29/12 0900  Gross per 24 hour  Intake 4222.5 ml  Output   1925 ml  Net 2297.5 ml   General: resting in bed, anxious HEENT: PERRL, EOMI, bitemporal heminopsia, no scleral icterus Cardiac: RRR, no rubs, murmurs or gallops Pulm: some diffuse wheezes, move air entry this AM compared to yesterday, decreased air volume, no accessory muscle use, only nasal cannula Abd: soft, nontender, nondistended, BS present Ext: warm and well perfused, no diaphoresis, no pedal edema Neuro: alert and oriented X3  Lab Results: Basic Metabolic Panel:  Recent Labs Lab 10/28/12 1416 10/29/12 0435  NA 137 140  K 4.4 4.1  CL 100 104  CO2 31 28  GLUCOSE 104* 180*  BUN 18 17  CREATININE 1.25 0.93  CALCIUM 8.5 8.4   Liver Function Tests:  Recent Labs Lab 10/28/12 1933  AST 23  ALT 16  ALKPHOS 69  BILITOT 0.2*  PROT 6.0  ALBUMIN 3.0*   CBC:  Recent Labs Lab 10/28/12 1416 10/29/12 0435  WBC 6.6 4.5  NEUTROABS 4.9  --   HGB 11.5* 11.4*  HCT 34.1* 34.7*  MCV 90.9 91.8  PLT 95* 97*   Thyroid Function Tests:  Recent Labs Lab 10/28/12 1933  TSH 0.502   Coagulation:  Recent Labs Lab 10/29/12 0435  LABPROT 14.8  INR 1.18   Urine Drug Screen: Drugs of Abuse     Component Value Date/Time   LABOPIA NONE DETECTED 05/21/2009 0004   COCAINSCRNUR NONE DETECTED  05/21/2009 0004   LABBENZ POSITIVE* 05/21/2009 0004   AMPHETMU NONE DETECTED 05/21/2009 0004   THCU NONE DETECTED 05/21/2009 0004   LABBARB  Value: NONE DETECTED        DRUG SCREEN FOR MEDICAL PURPOSES ONLY.  IF CONFIRMATION IS NEEDED FOR ANY PURPOSE, NOTIFY LAB WITHIN 5 DAYS.        LOWEST DETECTABLE LIMITS FOR URINE DRUG SCREEN Drug Class       Cutoff (ng/mL) Amphetamine      1000 Barbiturate      200 Benzodiazepine   200 Tricyclics       300 Opiates          300 Cocaine          300 THC              50 05/21/2009 0004   Micro Results: Recent Results (from the past 240 hour(s))  CULTURE, EXPECTORATED SPUTUM-ASSESSMENT     Status: None   Collection Time    10/29/12  1:16 AM      Result Value Range Status   Specimen Description SPUTUM   Final   Special Requests Normal   Final   Sputum evaluation     Final  Value: THIS SPECIMEN IS ACCEPTABLE. RESPIRATORY CULTURE REPORT TO FOLLOW.   Report Status 10/29/2012 FINAL   Final  MRSA PCR SCREENING     Status: None   Collection Time    10/29/12  1:17 AM      Result Value Range Status   MRSA by PCR NEGATIVE  NEGATIVE Final   Comment:            The GeneXpert MRSA Assay (FDA     approved for NASAL specimens     only), is one component of a     comprehensive MRSA colonization     surveillance program. It is not     intended to diagnose MRSA     infection nor to guide or     monitor treatment for     MRSA infections.   Studies/Results: Dg Chest 2 View  10/28/2012  **ADDENDUM** CREATED: 10/28/2012 15:26:32  Findings discussed with Dr. Lynelle Doctor on 10/28/2012 at 3:29 p.m.  **END ADDENDUM** SIGNED BY: Richarda Overlie, M.D.   10/28/2012  *RADIOLOGY REPORT*  Clinical Data: Cough and shortness of breath.  CHEST - 2 VIEW  Comparison: 05/20/2009  Findings: Two views of the chest demonstrate mild hyperinflation. There are densities in the right lung apex which could be related to scarring.  However, these densities  have progressed and there is concern for a 1.3 cm  nodular structure in this area. Interstitial markings are slightly prominent and may represent chronic changes.  Heart size is stable.  IMPRESSION: Right apical densities have progressed since the prior examination.  There may also be a nodular lesion which measures up to 1.3 cm.  Recommend further characterization with a chest CT.  Mild haziness in the lungs that could represent chronic changes.  Original Report Authenticated By: Richarda Overlie, M.D.    Ct Chest W Contrast  10/28/2012  *RADIOLOGY REPORT*  Clinical Data: Shortness of breath with cough, fever and chills. Possible right apical nodularity on radiographs.  CT CHEST WITH CONTRAST  Technique:  Multidetector CT imaging of the chest was performed following the standard protocol during bolus administration of intravenous contrast.  Contrast: 80mL OMNIPAQUE IOHEXOL 300 MG/ML  SOLN  Comparison: Prior chest radiographs 05/20/2009 and 10/28/2012. Cervical spine CT 02/13/2009.  Findings: There are partially calcified right hilar and subcarinal lymph nodes.  Noncalcified component of a subcarinal node measures 1.3 cm short axis on image 28.  There are mildly prominent non calcified precarinal nodes measuring up to 12 mm short axis.  There is no axillary adenopathy.  Contrast bolus is suboptimal.  There is central enlargement the pulmonary arteries consistent with pulmonary arterial hypertension. There is no evidence for pulmonary embolism on this non dedicated study.  There is no significant atherosclerosis.  A small amount of pleural thickening or pleural fluid is present on the right.  There is no left pleural effusion or pericardial effusion.  There is extensive chronic lung disease with emphysema and paraseptal bleb formation.  The asymmetric density at the right apex noted on radiographs appears to most likely represent apical scarring, progressed from prior radiographs, but without focal mass or adjacent chest wall abnormality. There are scattered nodular  densities with a tree-in-bud distribution, especially in the upper lobes.  There is a linear scarring atelectasis in the right lower lobe.  There is a 7 mm focal ground-glass opacity in the right middle lobe on image 29.  No dominant mass or endobronchial lesion is seen.  The visualized upper abdomen appears unremarkable.  There are no worrisome osseous findings.  IMPRESSION:  1.  No specific evidence of thoracic malignancy.  The asymmetric right apical density has an appearance most consistent with scarring. 2.  Extensive chronic lung disease with emphysema and additional areas of scarring.  Peribronchial nodularity has a tree-in-bud distribution and it is likely postinflammatory.  This can be seen with typical and atypical mycobacterial infection. 3.  Nonspecific prominent mediastinal and right hilar lymph nodes, some partially calcified.  This is likely related to prior granulomatous infection. 4.  Nonspecific small ground-glass opacity in the right middle lobe. Consider follow-up by chest CT without contrast in 3-6 months to confirm persistence.   This recommendation follows the consensus statement: Recommendations for the Management of Subsolid Pulmonary Nodules Detected at CT:  A Statement from the Fleischner Society as published in Radiology 2013; 266:304-317. 5.  Sternal nonunion.   Original Report Authenticated By: Carey Bullocks, M.D.    Mr Laqueta Jean RU Contrast  10/28/2012  *RADIOLOGY REPORT*  Clinical Data: Pituitary abnormality.  Headache.  Question pituitary apoplexy?  MRI HEAD WITHOUT AND WITH CONTRAST  Technique:  Multiplanar, multiecho pulse sequences of the brain and surrounding structures were obtained according to standard protocol without and with intravenous contrast  Contrast: 20mL MULTIHANCE GADOBENATE DIMEGLUMINE 529 MG/ML IV SOLN  Comparison: 09/24/2010.  Findings: The patient had a cough and images are motion degraded.  No acute infarct.  No evidence of pituitary apoplexy.  Heterogeneous  appearance of the pituitary gland.  Difficult to confirm or exclude a tiny microadenoma.  Scattered nonspecific white matter type changes similar to the prior exam.  This may be related to result of small vessel disease.  No MR evidence of intracranial hemorrhage.  No intracranial mass or abnormal enhancement.  Mild atrophy without hydrocephalus.  Major intracranial vascular structures are patent.  Paranasal sinus mucosal thickening.  Partial opacification ethmoid sinus air cells.  Cervical medullary junction, pineal region and orbital structures unremarkable.  IMPRESSION: The patient had a cough and images are motion degraded.  No acute infarct.  No evidence of pituitary apoplexy.  Heterogeneous appearance of the pituitary gland.  Difficult to confirm or exclude a tiny microadenoma.  Scattered nonspecific white matter type changes similar to the prior exam.  This may be related to result of small vessel disease.  No MR evidence of intracranial hemorrhage.  No intracranial mass or abnormal enhancement.  Paranasal sinus mucosal thickening.  Partial opacification ethmoid sinus air cells.   Original Report Authenticated By: Lacy Duverney, M.D.    Medications: I have reviewed the patient's current medications. Scheduled Meds: . albuterol  2.5 mg Nebulization Q4H  . ipratropium  0.5 mg Nebulization Q4H  . levofloxacin (LEVAQUIN) IV  750 mg Intravenous Q24H  . methylPREDNISolone (SOLU-MEDROL) injection  125 mg Intravenous Q6H  . sodium chloride  3 mL Intravenous Q12H   Continuous Infusions: . sodium chloride 150 mL/hr at 10/29/12 0600   PRN Meds:.chlorpheniramine-HYDROcodone, guaiFENesin-dextromethorphan, morphine injection, ondansetron (ZOFRAN) IV, ondansetron Assessment/Plan: 1. Adrenal crisis: Pt known hx of pituitary adenoma and adrenal insufficiency currently not on steroids for several years. Presented to ED hypotensive, abdominal pain and myalgia/arthralgia. Continues to not have electrolyte  abnormalities and BP stablizing. Pt had similar presentation about 4 yrs ago with adrenal insufficiency.  -IVF bolus  -solumedrol 125mg  q6hr  -TSH, T4, cortisol  -parvovirus B19, HIV  -UA, UCx, BCx, sputum culture  -PCCM made aware  -will probably need endocrinology consult at some point   2. COPD exacerbation: Pt  with sputum production , cough and SOB for past 5 days. Pt with nebs at home but did not use before presentation to ED. -IV levofloxacin  -steroids as #1  -scheduled duo nebs  -ativan prn for anxiety  3. Rule out acuteTB: Pt with hx of possible risky behaviors and incarceration. Today pt states long hx of night sweats since 11/13 after a sick contact at work. Has had chronic cough and unsure of weight loss. CXR concerning mass in apical region with calcifications. Some Concern for TB. Influenza negative, parvovirus pending. HIV negative. stat CT scan prelim showed atypical presentation can't r/o TB infection this was confirmed with discussion with radiologist.  -AFB sputum x3  -negative pressure room until sputum can be collected -respiratory culture pending -ID to formally see patient as consult  4. Pituitary apoplexy- ruled out: pt has known adenoma lost to follow up for 4-85yrs + usually saw Dr. Sharl Ma. No hx of gamma knife procedure or surgery. Sudden severe HA with associated vision loss. MRI showed no evidence of apoplexy, intracranial mass, or hemorrhage. Heterogenous appearance consistent with previous known adenoma.    Dispo: Disposition is deferred at this time, awaiting improvement of current medical problems.  Anticipated discharge in approximately 3-4 day(s).   The patient does have a current PCP (No primary provider on file.), therefore will be requiring [facility] follow-up after discharge.   The patient does not have transportation limitations that hinder transportation to clinic appointments.  .Services Needed at time of discharge: Y = Yes, Blank = No PT:   OT:   RN:    Equipment:   Other:     LOS: 1 day   Christen Bame, MD PGY-1 Pgr: [phone] 10/29/2012, 11:17 AM

## 2012-10-29 NOTE — Consult Note (Signed)
INFECTIOUS DISEASE CONSULT NOTE  Date of Admission:  10/28/2012  Date of Consult:  10/29/2012  Reason for Consult: Lung Nodules Referring Physician: Sadek/Garg  Impression/Recommendation Pneumonia/R apical density COPD Would- await his AFB tests, quantiferon Await sputum cx Could consider changing his anbx to azithro and ceftriaxone (although his PEN allergy sounds legitimate)  Comment- I suspect pt has COPD exacerbation, possible bronchitis. I also suspect pt may have MAI which is common to get in lungs with underlying structural abnormalities. To diagnose him with MAI, need at least 2 positive sputums, CT with consistent findings (or at least 2 CXR). The treatments for CAP (macrolide, quinolone) may predispose him to resistance. Treatment for MAI is often difficult, lengthy, poorly tolerated, and often recurs after therapy.   Thank you so much for this interesting consult,   Johny Sax [phone]  Steven Wiley is an 57 y.o. male.  HPI: 57 yo M with hx of COPD, pituitary microadenoma and resulting adrenal insufficiency, and bipolar disorder. He gives hx of multiple asbestos exposures through his work doing [employer]. States that he had sick exposure to co-worker in October and developed cough. He improved but never completely recovered. He again had worsening in January and was given anbx and prednisone.  Pt came [facility] on 5[facility]4 with 4 days of myalgias, arthralgias and severe headache. He dveloped green sputum (usually clear, occas yellow, no blood)had temp 103 at home. In ED he was afebrile, hypotensive. He was started on levaquin and steroids, placed in isolation He had CXR and then CT chest showing "tree in bud" peribronchial nodularity. He was also seen to have some calcified lymph nodes.   Risk factors- prev incarceration > 10 yr ago, heterosexual, no hx of homelessness, no ETOH x 5 yrs.   Past Medical History  Diagnosis Date  . Neuropathy   . Mental disorder   . COPD  (chronic obstructive pulmonary disease)   . Shortness of breath     Past Surgical History  Procedure Laterality Date  . Back surgery       Allergies  Allergen Reactions  . Acetaminophen   . Penicillins     Medications:  Scheduled: . albuterol  2.5 mg Nebulization Q4H  . ipratropium  0.5 mg Nebulization Q4H  . levofloxacin (LEVAQUIN) IV  750 mg Intravenous Q24H  . methylPREDNISolone (SOLU-MEDROL) injection  125 mg Intravenous Q6H  . sodium chloride  3 mL Intravenous Q12H    Total days of antibiotics: 2 (levaquin)          Social History:  reports that he has quit smoking. His smoking use included Cigarettes. He smoked 0.00 packs per day. He quit smokeless tobacco use about 2 weeks ago. He reports that he does not drink alcohol or use illicit drugs.  History reviewed. No pertinent family history.  General ROS: no LAN, wt has increased, normal BM, normal urination.  see HPI   Blood pressure 133/80, pulse 71, temperature 98.1 F (36.7 C), temperature source Oral, resp. rate 20, height 5\' 10"  (1.778 m), weight 91.5 kg (201 lb 11.5 oz), SpO2 93.00%. General appearance: alert, cooperative and no distress Eyes: negative findings: pupils equal, round, reactive to light and accomodation Throat: normal findings: oropharynx pink & moist without lesions or evidence of thrush Neck: no adenopathy and supple, symmetrical, trachea midline Lungs: rhonchi anterior - bilateral Heart: regular rate and rhythm Abdomen: normal findings: bowel sounds normal and soft, non-tender Extremities: edema none No axillary LAN.    Results for orders placed during  the hospital encounter of 10/28/12 (from the past 48 hour(s))  CBC WITH DIFFERENTIAL     Status: Abnormal   Collection Time    10/28/12  2:16 PM      Result Value Range   WBC 6.6  4.0 - 10.5 K/uL   RBC 3.75 (*) 4.22 - 5.81 MIL/uL   Hemoglobin 11.5 (*) 13.0 - 17.0 g/dL   HCT 16.1 (*) 09.6 - 04.5 %   MCV 90.9  78.0 - 100.0 fL   MCH 30.7   26.0 - 34.0 pg   MCHC 33.7  30.0 - 36.0 g/dL   RDW 40.9  81.1 - 91.4 %   Platelets 95 (*) 150 - 400 K/uL   Comment: PLATELET COUNT CONFIRMED BY SMEAR   Neutrophils Relative 74  43 - 77 %   Neutro Abs 4.9  1.7 - 7.7 K/uL   Lymphocytes Relative 14  12 - 46 %   Lymphs Abs 0.9  0.7 - 4.0 K/uL   Monocytes Relative 9  3 - 12 %   Monocytes Absolute 0.6  0.1 - 1.0 K/uL   Eosinophils Relative 2  0 - 5 %   Eosinophils Absolute 0.1  0.0 - 0.7 K/uL   Basophils Relative 1  0 - 1 %   Basophils Absolute 0.0  0.0 - 0.1 K/uL  BASIC METABOLIC PANEL     Status: Abnormal   Collection Time    10/28/12  2:16 PM      Result Value Range   Sodium 137  135 - 145 mEq/L   Potassium 4.4  3.5 - 5.1 mEq/L   Chloride 100  96 - 112 mEq/L   CO2 31  19 - 32 mEq/L   Glucose, Bld 104 (*) 70 - 99 mg/dL   BUN 18  6 - 23 mg/dL   Creatinine, Ser 7.82  0.50 - 1.35 mg/dL   Calcium 8.5  8.4 - 95.6 mg/dL   GFR calc non Af Amer 62 (*) >90 mL/min   GFR calc Af Amer 72 (*) >90 mL/min   Comment:            The eGFR has been calculated     using the CKD EPI equation.     This calculation has not been     validated in all clinical     situations.     eGFR's persistently     <90 mL/min signify     possible Chronic Kidney Disease.  HIV ANTIBODY (ROUTINE TESTING)     Status: None   Collection Time    10/28/12  4:57 PM      Result Value Range   HIV NON REACTIVE  NON REACTIVE  INFLUENZA PANEL BY PCR     Status: None   Collection Time    10/28/12  5:00 PM      Result Value Range   Influenza A By PCR NEGATIVE  NEGATIVE   Influenza B By PCR NEGATIVE  NEGATIVE   H1N1 flu by pcr NOT DETECTED  NOT DETECTED   Comment:            The Xpert Flu assay (FDA approved for     nasal aspirates or washes and     nasopharyngeal swab specimens), is     intended as an aid in the diagnosis of     influenza and should not be used as     a sole basis for treatment.  POCT I-STAT 3, BLOOD GAS (G3+)  Status: Abnormal   Collection Time      10/28/12  5:20 PM      Result Value Range   pH, Arterial 7.346 (*) 7.350 - 7.450   pCO2 arterial 53.8 (*) 35.0 - 45.0 mmHg   pO2, Arterial 136.0 (*) 80.0 - 100.0 mmHg   Bicarbonate 29.2 (*) 20.0 - 24.0 mEq/L   TCO2 31  0 - 100 mmol/L   O2 Saturation 99.0     Acid-Base Excess 3.0 (*) 0.0 - 2.0 mmol/L   Patient temperature 99.9 F     Collection site RADIAL, ALLEN'S TEST ACCEPTABLE     Drawn by RT     Sample type ARTERIAL    LACTIC ACID, PLASMA     Status: None   Collection Time    10/28/12  7:32 PM      Result Value Range   Lactic Acid, Venous 1.6  0.5 - 2.2 mmol/L  HEPATIC FUNCTION PANEL     Status: Abnormal   Collection Time    10/28/12  7:33 PM      Result Value Range   Total Protein 6.0  6.0 - 8.3 g/dL   Albumin 3.0 (*) 3.5 - 5.2 g/dL   AST 23  0 - 37 U/L   ALT 16  0 - 53 U/L   Alkaline Phosphatase 69  39 - 117 U/L   Total Bilirubin 0.2 (*) 0.3 - 1.2 mg/dL   Bilirubin, Direct <1.9  0.0 - 0.3 mg/dL   Indirect Bilirubin NOT CALCULATED  0.3 - 0.9 mg/dL  TSH     Status: None   Collection Time    10/28/12  7:33 PM      Result Value Range   TSH 0.502  0.350 - 4.500 uIU/mL  CORTISOL     Status: None   Collection Time    10/28/12  7:37 PM      Result Value Range   Cortisol, Plasma 18.4     Comment: (NOTE)     AM:  4.3 - 22.4 ug/dL     PM:  3.1 - 14.7 ug/dL  CULTURE, EXPECTORATED SPUTUM-ASSESSMENT     Status: None   Collection Time    10/29/12  1:16 AM      Result Value Range   Specimen Description SPUTUM     Special Requests Normal     Sputum evaluation       Value: THIS SPECIMEN IS ACCEPTABLE. RESPIRATORY CULTURE REPORT TO FOLLOW.   Report Status 10/29/2012 FINAL    MRSA PCR SCREENING     Status: None   Collection Time    10/29/12  1:17 AM      Result Value Range   MRSA by PCR NEGATIVE  NEGATIVE   Comment:            The GeneXpert MRSA Assay (FDA     approved for NASAL specimens     only), is one component of a     comprehensive MRSA colonization      surveillance program. It is not     intended to diagnose MRSA     infection nor to guide or     monitor treatment for     MRSA infections.  BASIC METABOLIC PANEL     Status: Abnormal   Collection Time    10/29/12  4:35 AM      Result Value Range   Sodium 140  135 - 145 mEq/L   Potassium 4.1  3.5 - 5.1 mEq/L  Chloride 104  96 - 112 mEq/L   CO2 28  19 - 32 mEq/L   Glucose, Bld 180 (*) 70 - 99 mg/dL   BUN 17  6 - 23 mg/dL   Creatinine, Ser 1.61  0.50 - 1.35 mg/dL   Calcium 8.4  8.4 - 09.6 mg/dL   GFR calc non Af Amer >90  >90 mL/min   GFR calc Af Amer >90  >90 mL/min   Comment:            The eGFR has been calculated     using the CKD EPI equation.     This calculation has not been     validated in all clinical     situations.     eGFR's persistently     <90 mL/min signify     possible Chronic Kidney Disease.  CBC     Status: Abnormal   Collection Time    10/29/12  4:35 AM      Result Value Range   WBC 4.5  4.0 - 10.5 K/uL   RBC 3.78 (*) 4.22 - 5.81 MIL/uL   Hemoglobin 11.4 (*) 13.0 - 17.0 g/dL   HCT 04.5 (*) 40.9 - 81.1 %   MCV 91.8  78.0 - 100.0 fL   MCH 30.2  26.0 - 34.0 pg   MCHC 32.9  30.0 - 36.0 g/dL   RDW 91.4  78.2 - 95.6 %   Platelets 97 (*) 150 - 400 K/uL   Comment: CONSISTENT WITH PREVIOUS RESULT  APTT     Status: Abnormal   Collection Time    10/29/12  4:35 AM      Result Value Range   aPTT 39 (*) 24 - 37 seconds   Comment:            IF BASELINE aPTT IS ELEVATED,     SUGGEST PATIENT RISK ASSESSMENT     BE USED TO DETERMINE APPROPRIATE     ANTICOAGULANT THERAPY.  PROTIME-INR     Status: None   Collection Time    10/29/12  4:35 AM      Result Value Range   Prothrombin Time 14.8  11.6 - 15.2 seconds   INR 1.18  0.00 - 1.49      Component Value Date/Time   SDES SPUTUM 10/29/2012 0116   SPECREQUEST Normal 10/29/2012 0116   CULT NO GROWTH 5 DAYS 01/12/2009 0832   REPTSTATUS 10/29/2012 FINAL 10/29/2012 0116   Dg Chest 2 View  10/28/2012  **ADDENDUM**  CREATED: 10/28/2012 15:26:32  Findings discussed with Dr. Lynelle Doctor on 10/28/2012 at 3:29 p.m.  **END ADDENDUM** SIGNED BY: Richarda Overlie, M.D.   10/28/2012  *RADIOLOGY REPORT*  Clinical Data: Cough and shortness of breath.  CHEST - 2 VIEW  Comparison: 05/20/2009  Findings: Two views of the chest demonstrate mild hyperinflation. There are densities in the right lung apex which could be related to scarring.  However, these densities  have progressed and there is concern for a 1.3 cm nodular structure in this area. Interstitial markings are slightly prominent and may represent chronic changes.  Heart size is stable.  IMPRESSION: Right apical densities have progressed since the prior examination.  There may also be a nodular lesion which measures up to 1.3 cm.  Recommend further characterization with a chest CT.  Mild haziness in the lungs that could represent chronic changes.  Original Report Authenticated By: Richarda Overlie, M.D.    Ct Chest W Contrast  10/28/2012  *RADIOLOGY REPORT*  Clinical Data: Shortness of breath with cough, fever and chills. Possible right apical nodularity on radiographs.  CT CHEST WITH CONTRAST  Technique:  Multidetector CT imaging of the chest was performed following the standard protocol during bolus administration of intravenous contrast.  Contrast: 80mL OMNIPAQUE IOHEXOL 300 MG/ML  SOLN  Comparison: Prior chest radiographs 05/20/2009 and 10/28/2012. Cervical spine CT 02/13/2009.  Findings: There are partially calcified right hilar and subcarinal lymph nodes.  Noncalcified component of a subcarinal node measures 1.3 cm short axis on image 28.  There are mildly prominent non calcified precarinal nodes measuring up to 12 mm short axis.  There is no axillary adenopathy.  Contrast bolus is suboptimal.  There is central enlargement the pulmonary arteries consistent with pulmonary arterial hypertension. There is no evidence for pulmonary embolism on this non dedicated study.  There is no significant  atherosclerosis.  A small amount of pleural thickening or pleural fluid is present on the right.  There is no left pleural effusion or pericardial effusion.  There is extensive chronic lung disease with emphysema and paraseptal bleb formation.  The asymmetric density at the right apex noted on radiographs appears to most likely represent apical scarring, progressed from prior radiographs, but without focal mass or adjacent chest wall abnormality. There are scattered nodular densities with a tree-in-bud distribution, especially in the upper lobes.  There is a linear scarring atelectasis in the right lower lobe.  There is a 7 mm focal ground-glass opacity in the right middle lobe on image 29.  No dominant mass or endobronchial lesion is seen.  The visualized upper abdomen appears unremarkable.  There are no worrisome osseous findings.  IMPRESSION:  1.  No specific evidence of thoracic malignancy.  The asymmetric right apical density has an appearance most consistent with scarring. 2.  Extensive chronic lung disease with emphysema and additional areas of scarring.  Peribronchial nodularity has a tree-in-bud distribution and it is likely postinflammatory.  This can be seen with typical and atypical mycobacterial infection. 3.  Nonspecific prominent mediastinal and right hilar lymph nodes, some partially calcified.  This is likely related to prior granulomatous infection. 4.  Nonspecific small ground-glass opacity in the right middle lobe. Consider follow-up by chest CT without contrast in 3-6 months to confirm persistence.   This recommendation follows the consensus statement: Recommendations for the Management of Subsolid Pulmonary Nodules Detected at CT:  A Statement from the Fleischner Society as published in Radiology 2013; 266:304-317. 5.  Sternal nonunion.   Original Report Authenticated By: Carey Bullocks, M.D.    Mr Laqueta Jean ZO Contrast  10/28/2012  *RADIOLOGY REPORT*  Clinical Data: Pituitary abnormality.   Headache.  Question pituitary apoplexy?  MRI HEAD WITHOUT AND WITH CONTRAST  Technique:  Multiplanar, multiecho pulse sequences of the brain and surrounding structures were obtained according to standard protocol without and with intravenous contrast  Contrast: 20mL MULTIHANCE GADOBENATE DIMEGLUMINE 529 MG/ML IV SOLN  Comparison: 09/24/2010.  Findings: The patient had a cough and images are motion degraded.  No acute infarct.  No evidence of pituitary apoplexy.  Heterogeneous appearance of the pituitary gland.  Difficult to confirm or exclude a tiny microadenoma.  Scattered nonspecific white matter type changes similar to the prior exam.  This may be related to result of small vessel disease.  No MR evidence of intracranial hemorrhage.  No intracranial mass or abnormal enhancement.  Mild atrophy without hydrocephalus.  Major intracranial vascular structures are patent.  Paranasal sinus mucosal thickening.  Partial opacification ethmoid  sinus air cells.  Cervical medullary junction, pineal region and orbital structures unremarkable.  IMPRESSION: The patient had a cough and images are motion degraded.  No acute infarct.  No evidence of pituitary apoplexy.  Heterogeneous appearance of the pituitary gland.  Difficult to confirm or exclude a tiny microadenoma.  Scattered nonspecific white matter type changes similar to the prior exam.  This may be related to result of small vessel disease.  No MR evidence of intracranial hemorrhage.  No intracranial mass or abnormal enhancement.  Paranasal sinus mucosal thickening.  Partial opacification ethmoid sinus air cells.   Original Report Authenticated By: Lacy Duverney, M.D.    Recent Results (from the past 240 hour(s))  CULTURE, EXPECTORATED SPUTUM-ASSESSMENT     Status: None   Collection Time    10/29/12  1:16 AM      Result Value Range Status   Specimen Description SPUTUM   Final   Special Requests Normal   Final   Sputum evaluation     Final   Value: THIS SPECIMEN  IS ACCEPTABLE. RESPIRATORY CULTURE REPORT TO FOLLOW.   Report Status 10/29/2012 FINAL   Final  MRSA PCR SCREENING     Status: None   Collection Time    10/29/12  1:17 AM      Result Value Range Status   MRSA by PCR NEGATIVE  NEGATIVE Final   Comment:            The GeneXpert MRSA Assay (FDA     approved for NASAL specimens     only), is one component of a     comprehensive MRSA colonization     surveillance program. It is not     intended to diagnose MRSA     infection nor to guide or     monitor treatment for     MRSA infections.      10/29/2012, 1:57 PM     LOS: 1 day

## 2012-10-29 NOTE — Progress Notes (Signed)
Report called to [employer], pt going to [id number] negative pressure room non tele via w/c and belongings. Steven Wiley

## 2012-10-29 NOTE — ED Provider Notes (Signed)
Medical screening examination/treatment/procedure(s) were performed by non-physician practitioner and as supervising physician I was immediately available for consultation/collaboration.    Celene Kras, MD 10/29/12 [phone]

## 2012-10-29 NOTE — Consult Note (Signed)
PULMONARY  / CRITICAL CARE MEDICINE  Name: Laverne Klugh MRN: [id number] DOB: Apr 23, 1956    ADMISSION DATE:  10/28/2012 CONSULTATION DATE:  10/29/2012  REFERRING MD :  Eben Burow PRIMARY SERVICE:  [facility]  CHIEF COMPLAINT:  SOB and cough  BRIEF PATIENT DESCRIPTION: 57 yo M with hx of  COPD presents with increasing SOB and productive cough x 5 days  SIGNIFICANT EVENTS / STUDIES:  5/4 chest CT>>>No evidence of thoracic malignancy. Right apical density appearance most consistent w/ scarring. Extensive chronic lung disease w/ emphysema and additional areas of scarring. Peribronchial nodularity has a tree-in-bud distribution and is likely postinflammatory. Nonspecific prominent mediastinal and right hilar lymph nodes, some partially calcified. This is likely related to prior granulomatous infection. Nonspecific small ground-glass opacity in the right middle lobe.   LINES / TUBES: Peripheral IV  CULTURES: 5/5 AFB culture>>> 5/5 sputum culture>>> 5/5 Quantiferon gold>>>  ANTIBIOTICS: 5/4 Levaquin>>>  HISTORY OF PRESENT ILLNESS:  57 yo M with hx of COPD presents with increasing sob and productive cough with green sputum onset 5 days ago, associated with recent night sweats, N/V, tremors, and severe myalgias. Pt denies hemoptysis. He has had multiple sick contacts with respiratory symptoms over the past 6 months. Pt was incarcerated many years ago 10+ for DWI without extended stay and has never been homeless. 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  PAST MEDICAL HISTORY :  Past Medical History  Diagnosis Date  . Neuropathy   . Mental disorder   . COPD (chronic obstructive pulmonary disease)   . Shortness of breath    Past Surgical History  Procedure Laterality Date  . Back surgery     Prior to Admission medications   Medication Sig Start Date End Date Taking? Authorizing Provider  FLUoxetine (PROZAC) 20 MG capsule Take 40 mg by mouth  daily.   Yes Historical Provider, MD  gabapentin (NEURONTIN) 600 MG tablet Take 600 mg by mouth 3 (three) times daily.   Yes Historical Provider, MD  traZODone (DESYREL) 100 MG tablet Take 200 mg by mouth at bedtime.   Yes Historical Provider, MD   Allergies  Allergen Reactions  . Acetaminophen   . Penicillins Hives    Hives, blisters    FAMILY HISTORY:  Family History  Problem Relation Age of Onset  . Colon cancer Father   . Dementia Mother    SOCIAL HISTORY: Pt smoked a pack a day for the past 20 years.   REVIEW OF SYSTEMS:   Constitutional: Positive for fever, chils, malaise/fatigue. Negative for weight loss and diaphoresis.  HENT: Positive for sore throat. Negative for hearing loss, ear pain, nosebleeds, congestion, neck pain, tinnitus and ear discharge.   Eyes: Negative for blurred vision, double vision, photophobia, pain, discharge and redness.  Respiratory: Positive for cough, sputum production, wheezing, and shortness of breath. Negative for hemoptysis,  and stridor.   Cardiovascular: Negative for chest pain, palpitations, orthopnea, claudication, leg swelling and PND.  Gastrointestinal: Positive for N/V. Negative for heartburn, abdominal pain, diarrhea, constipation, blood in stool and melena.  Genitourinary: Negative for dysuria, urgency, frequency, hematuria and flank pain.  Musculoskeletal: Positive for myalgias and joint pain. Negative for back pain and falls.  Skin: Negative for itching and rash.  Neurological: Negative for dizziness, tingling, tremors, sensory change, speech change, focal weakness, seizures, loss of consciousness, weakness and headaches.  Endo/Heme/Allergies: Negative for environmental allergies and polydipsia. Does not bruise/bleed easily.  SUBJECTIVE: Pt c/o increasing sob  and cough.   VITAL SIGNS: Temp:  [97.3 F (36.3 C)-99.9 F (37.7 C)] 97.3 F (36.3 C) (05/05 1509) Pulse Rate:  [69-90] 71 (05/05 1153) Resp:  [13-20] 20 (05/05 1153) BP:  (96-147)/(46-86) 133/80 mmHg (05/05 1153) SpO2:  [91 %-100 %] 93 % (05/05 1229) Weight:  [201 lb 11.5 oz (91.5 kg)] 201 lb 11.5 oz (91.5 kg) (05/04 2009)  PHYSICAL EXAMINATION: General:  Awake and alert in NAD Neuro:  No focal deficits HEENT:  PERRL. Oral mucosa moist Neck:  supple Cardiovascular:  RRR. No m/g/r Lungs:  No respiratory distress. Bilateral inspiratory and expiratory wheeze. No accessory muscle use. On 3L O2 Abdomen:  Soft and nontender to palpation Musculoskeletal:  No obvious deformities Skin:  Warm and dry   Recent Labs Lab 10/28/12 1416 10/29/12 0435  NA 137 140  K 4.4 4.1  CL 100 104  CO2 31 28  BUN 18 17  CREATININE 1.25 0.93  GLUCOSE 104* 180*    Recent Labs Lab 10/28/12 1416 10/29/12 0435  HGB 11.5* 11.4*  HCT 34.1* 34.7*  WBC 6.6 4.5  PLT 95* 97*   CXR 5/4>>>Right apical densities have progressed since the prior examination. There may also be a nodular lesion which measures up to 1.3 cm. Recommend further characterization with a chest CT.   Chest CT with contrast>>> No evidence of thoracic malignancy. Right apical density appearance most consistent w/ scarring. Extensive chronic lung disease w/ emphysema and additional areas of scarring. Peribronchial nodularity has a tree-in-bud distribution and is likely postinflammatory. Nonspecific prominent mediastinal and right hilar lymph nodes, some partially calcified. This is likely related to prior granulomatous infection. Nonspecific small ground-glass opacity in the right middle lobe.    ASSESSMENT / PLAN:  PULMONARY A: Hx of smoking with changes of emphysema on CT chest and diagnosis of COPD with acute exacerbation. Progress Rt upper lobe infiltrate likely from acute respiratory infection >> less likely TB or MAI. Chronic hypercapnic respiratory failure >> likely from COPD/emphysema. P:  -PFTs as outpatient when more stable -continue albuterol and atrovent -change solumedrol to 40 mg  q8h -continue levaquin; being managed by primary and id -sputum culture, AFB, and Quantiferon gold -no indication for bronchoscopy at this time -keep O2 sats>92% -f/u CXR intermittently -will need to check Alpha 1 anti-trypsin levels at some point -need to ensure pneumococcal vaccination at some point  Gery Pray, PA-S Anna Genre, PA-S  Pulmonary and Critical Care Medicine [facility] Pager: [phone]  10/29/2012, 3:10 PM  Reviewed above, examined and agree with assessment/plan.  He has extensive hx of smoking and extensive changes of emphysema on CT chest.  He has acute bronchospasm with probable acute respiratory illness.  Less likely to have tuberculosis or MAI.  Agree with continued scheduled BD's, will adjust corticosteroids, and continue Abx per ID and primary team.  F/u AFB smears and Quantiferon Gold results.  Will need outpt PFT and A1AT testing.  Need to ensure pneumococcal vaccination.  Will needed to assess for home oxygen prior to d/c.  Try to avoid benzo's, narcotics in setting of COPD with hypercapnic respiratory failure.  Coralyn Helling, MD [facility] Pulmonary/Critical Care 10/29/2012, 5:23 PM Pager:  [phone] After 3pm call: [phone]

## 2012-10-30 DIAGNOSIS — J9620 Acute and chronic respiratory failure, unspecified whether with hypoxia or hypercapnia: Secondary | ICD-10-CM

## 2012-10-30 LAB — CBC
MCH: 31.4 pg (ref 26.0–34.0)
MCHC: 34.6 g/dL (ref 30.0–36.0)
MCV: 90.6 fL (ref 78.0–100.0)
Platelets: 134 10*3/uL — ABNORMAL LOW (ref 150–400)
RDW: 14.1 % (ref 11.5–15.5)

## 2012-10-30 LAB — BASIC METABOLIC PANEL
CO2: 27 mEq/L (ref 19–32)
Calcium: 8.6 mg/dL (ref 8.4–10.5)
Creatinine, Ser: 0.83 mg/dL (ref 0.50–1.35)
Glucose, Bld: 167 mg/dL — ABNORMAL HIGH (ref 70–99)

## 2012-10-30 MED ORDER — PREDNISONE 50 MG PO TABS
60.0000 mg | ORAL_TABLET | Freq: Every day | ORAL | Status: DC
  Administered 2012-10-31: 60 mg via ORAL
  Filled 2012-10-30 (×2): qty 1

## 2012-10-30 MED ORDER — LEVOFLOXACIN 500 MG PO TABS
500.0000 mg | ORAL_TABLET | ORAL | Status: DC
  Administered 2012-10-30 – 2012-11-01 (×3): 500 mg via ORAL
  Filled 2012-10-30 (×4): qty 1

## 2012-10-30 NOTE — Progress Notes (Signed)
Internal Medicine Teaching Service Attending Note Date: 10/30/2012  Patient name: Steven Wiley  Medical record number: [id number]  Date of birth: 10-27-1955    This patient has been seen and discussed with the house staff. Please see their note for complete details. I concur with their findings with the following additions/corrections: Patient to be continued on oral prednisone for COPD exacerbation. Patient to be given appointment with pulmonary for management of his severe COPD. We will also try to contact his primary care physician and set up an appointment with him at discharge.  Lars Mage 10/30/2012, 8:13 PM

## 2012-10-30 NOTE — Progress Notes (Signed)
INFECTIOUS DISEASE PROGRESS NOTE  ID: Steven Wiley is a 57 y.o. male with   Principal Problem:   COPD exacerbation Active Problems:   Adrenal insufficiency   Acute adrenal crisis   Pituitary adenoma   Anxiety disorder   Acute-on-chronic respiratory failure  Subjective: C/o pleuritic CP  Abtx:  Anti-infectives   Start     Dose/Rate Route Frequency Ordered Stop   10/28/12 2100  levofloxacin (LEVAQUIN) IVPB 750 mg     750 mg 100 mL/hr over 90 Minutes Intravenous Every 24 hours 10/28/12 2008        Medications:  Scheduled: . albuterol  2.5 mg Nebulization Q4H  . FLUoxetine  40 mg Oral Daily  . gabapentin  600 mg Oral TID  . ipratropium  0.5 mg Nebulization Q4H  . levofloxacin (LEVAQUIN) IV  750 mg Intravenous Q24H  . [START ON 10/31/2012] predniSONE  60 mg Oral Q breakfast  . sodium chloride  3 mL Intravenous Q12H    Objective: Vital signs in last 24 hours: Temp:  [97.7 F (36.5 C)-97.8 F (36.6 C)] 97.7 F (36.5 C) (05/06 1335) Pulse Rate:  [65-77] 73 (05/06 1335) Resp:  [16-19] 18 (05/06 1335) BP: (103-143)/(47-82) 103/47 mmHg (05/06 1335) SpO2:  [92 %-98 %] 94 % (05/06 1335) Weight:  [92.4 kg (203 lb 11.3 oz)-94.5 kg (208 lb 5.4 oz)] 92.4 kg (203 lb 11.3 oz) (05/06 1610)   General appearance: alert, appears stated age and no distress Resp: rhonchi bilaterally and wheezes bilaterally Cardio: regular rate and rhythm GI: normal findings: bowel sounds normal and soft, non-tender  Lab Results  Recent Labs  10/29/12 0435 10/30/12 0900  WBC 4.5 8.2  HGB 11.4* 12.7*  HCT 34.7* 36.7*  NA 140 136  K 4.1 4.0  CL 104 100  CO2 28 27  BUN 17 16  CREATININE 0.93 0.83   Liver Panel  Recent Labs  10/28/12 1933  PROT 6.0  ALBUMIN 3.0*  AST 23  ALT 16  ALKPHOS 69  BILITOT 0.2*  BILIDIR <0.1  IBILI NOT CALCULATED   Sedimentation Rate No results found for this basename: ESRSEDRATE,  in the last 72 hours C-Reactive Protein No results found for this  basename: CRP,  in the last 72 hours  Microbiology: Recent Results (from the past 240 hour(s))  CULTURE, EXPECTORATED SPUTUM-ASSESSMENT     Status: None   Collection Time    10/29/12  1:16 AM      Result Value Range Status   Specimen Description SPUTUM   Final   Special Requests Normal   Final   Sputum evaluation     Final   Value: THIS SPECIMEN IS ACCEPTABLE. RESPIRATORY CULTURE REPORT TO FOLLOW.   Report Status 10/29/2012 FINAL   Final  CULTURE, RESPIRATORY (NON-EXPECTORATED)     Status: None   Collection Time    10/29/12  1:16 AM      Result Value Range Status   Specimen Description SPUTUM   Final   Special Requests ADDED 0154   Final   Gram Stain     Final   Value: MODERATE WBC PRESENT,BOTH PMN AND MONONUCLEAR     FEW SQUAMOUS EPITHELIAL CELLS PRESENT     MODERATE GRAM POSITIVE COCCI     IN PAIRS IN CHAINS FEW GRAM NEGATIVE RODS     RARE GRAM NEGATIVE COCCI   Culture NORMAL OROPHARYNGEAL FLORA   Final   Report Status PENDING   Incomplete  MRSA PCR SCREENING     Status:  None   Collection Time    10/29/12  1:17 AM      Result Value Range Status   MRSA by PCR NEGATIVE  NEGATIVE Final   Comment:            The GeneXpert MRSA Assay (FDA     approved for NASAL specimens     only), is one component of a     comprehensive MRSA colonization     surveillance program. It is not     intended to diagnose MRSA     infection nor to guide or     monitor treatment for     MRSA infections.    Studies/Results: Ct Chest W Contrast  10/28/2012  *RADIOLOGY REPORT*  Clinical Data: Shortness of breath with cough, fever and chills. Possible right apical nodularity on radiographs.  CT CHEST WITH CONTRAST  Technique:  Multidetector CT imaging of the chest was performed following the standard protocol during bolus administration of intravenous contrast.  Contrast: 80mL OMNIPAQUE IOHEXOL 300 MG/ML  SOLN  Comparison: Prior chest radiographs 05/20/2009 and 10/28/2012. Cervical spine CT 02/13/2009.   Findings: There are partially calcified right hilar and subcarinal lymph nodes.  Noncalcified component of a subcarinal node measures 1.3 cm short axis on image 28.  There are mildly prominent non calcified precarinal nodes measuring up to 12 mm short axis.  There is no axillary adenopathy.  Contrast bolus is suboptimal.  There is central enlargement the pulmonary arteries consistent with pulmonary arterial hypertension. There is no evidence for pulmonary embolism on this non dedicated study.  There is no significant atherosclerosis.  A small amount of pleural thickening or pleural fluid is present on the right.  There is no left pleural effusion or pericardial effusion.  There is extensive chronic lung disease with emphysema and paraseptal bleb formation.  The asymmetric density at the right apex noted on radiographs appears to most likely represent apical scarring, progressed from prior radiographs, but without focal mass or adjacent chest wall abnormality. There are scattered nodular densities with a tree-in-bud distribution, especially in the upper lobes.  There is a linear scarring atelectasis in the right lower lobe.  There is a 7 mm focal ground-glass opacity in the right middle lobe on image 29.  No dominant mass or endobronchial lesion is seen.  The visualized upper abdomen appears unremarkable.  There are no worrisome osseous findings.  IMPRESSION:  1.  No specific evidence of thoracic malignancy.  The asymmetric right apical density has an appearance most consistent with scarring. 2.  Extensive chronic lung disease with emphysema and additional areas of scarring.  Peribronchial nodularity has a tree-in-bud distribution and it is likely postinflammatory.  This can be seen with typical and atypical mycobacterial infection. 3.  Nonspecific prominent mediastinal and right hilar lymph nodes, some partially calcified.  This is likely related to prior granulomatous infection. 4.  Nonspecific small ground-glass  opacity in the right middle lobe. Consider follow-up by chest CT without contrast in 3-6 months to confirm persistence.   This recommendation follows the consensus statement: Recommendations for the Management of Subsolid Pulmonary Nodules Detected at CT:  A Statement from the Fleischner Society as published in Radiology 2013; 266:304-317. 5.  Sternal nonunion.   Original Report Authenticated By: Carey Bullocks, M.D.    Mr Laqueta Jean EA Contrast  10/28/2012  *RADIOLOGY REPORT*  Clinical Data: Pituitary abnormality.  Headache.  Question pituitary apoplexy?  MRI HEAD WITHOUT AND WITH CONTRAST  Technique:  Multiplanar, multiecho pulse  sequences of the brain and surrounding structures were obtained according to standard protocol without and with intravenous contrast  Contrast: 20mL MULTIHANCE GADOBENATE DIMEGLUMINE 529 MG/ML IV SOLN  Comparison: 09/24/2010.  Findings: The patient had a cough and images are motion degraded.  No acute infarct.  No evidence of pituitary apoplexy.  Heterogeneous appearance of the pituitary gland.  Difficult to confirm or exclude a tiny microadenoma.  Scattered nonspecific white matter type changes similar to the prior exam.  This may be related to result of small vessel disease.  No MR evidence of intracranial hemorrhage.  No intracranial mass or abnormal enhancement.  Mild atrophy without hydrocephalus.  Major intracranial vascular structures are patent.  Paranasal sinus mucosal thickening.  Partial opacification ethmoid sinus air cells.  Cervical medullary junction, pineal region and orbital structures unremarkable.  IMPRESSION: The patient had a cough and images are motion degraded.  No acute infarct.  No evidence of pituitary apoplexy.  Heterogeneous appearance of the pituitary gland.  Difficult to confirm or exclude a tiny microadenoma.  Scattered nonspecific white matter type changes similar to the prior exam.  This may be related to result of small vessel disease.  No MR evidence of  intracranial hemorrhage.  No intracranial mass or abnormal enhancement.  Paranasal sinus mucosal thickening.  Partial opacification ethmoid sinus air cells.   Original Report Authenticated By: Lacy Duverney, M.D.      Assessment/Plan: COPD R apical density  quantiferon pending. afb stains pending Would change his levaquin to PO.  He asks for improved pain control for his pleuritic pain D/c planning as he improves (off O2?)  Total days of antibiotics: 3 (levaquin)   Johny Sax Infectious Diseases [phone] 10/30/2012, 3:13 PM   LOS: 2 days

## 2012-10-30 NOTE — Progress Notes (Signed)
PULMONARY  / CRITICAL CARE MEDICINE  Name: Steven Wiley MRN: [id number] DOB: 06-Mar-1956    ADMISSION DATE:  10/28/2012 CONSULTATION DATE:  10/29/2012  REFERRING MD :  Eben Burow PRIMARY SERVICE:  [facility]  CHIEF COMPLAINT:  SOB and cough  BRIEF PATIENT DESCRIPTION: 57 yo M with hx of  COPD presents with increasing SOB and productive cough x 5 days  SIGNIFICANT EVENTS / STUDIES:  5/4 chest CT>>>No evidence of thoracic malignancy. Right apical density appearance most consistent w/ scarring. Extensive chronic lung disease w/ emphysema and additional areas of scarring. Peribronchial nodularity has a tree-in-bud distribution and is likely postinflammatory. Nonspecific prominent mediastinal and right hilar lymph nodes, some partially calcified. This is likely related to prior granulomatous infection. Nonspecific small ground-glass opacity in the right middle lobe.   LINES / TUBES: Peripheral IV  CULTURES: 5/5 AFB culture>>> 5/5 sputum culture>>> 5/5 Quantiferon gold>>>  ANTIBIOTICS: 5/4 Levaquin>>>   SUBJECTIVE:  Doing  Better  VITAL SIGNS: Temp:  [97.3 F (36.3 C)-97.8 F (36.6 C)] 97.7 F (36.5 C) (05/06 0643) Pulse Rate:  [65-90] 65 (05/06 0643) Resp:  [16-19] 16 (05/06 0643) BP: (117-143)/(63-82) 143/76 mmHg (05/06 0643) SpO2:  [92 %-98 %] 96 % (05/06 0733) Weight:  [92.4 kg (203 lb 11.3 oz)-94.5 kg (208 lb 5.4 oz)] 92.4 kg (203 lb 11.3 oz) (05/06 0643) 2 liters  PHYSICAL EXAMINATION: General:  Awake and alert in NAD Neuro:  No focal deficits HEENT:  PERRL. Oral mucosa moist Neck:  supple Cardiovascular:  RRR. No m/g/r Lungs:  No respiratory distress. Bilateral inspiratory and expiratory wheeze. No accessory muscle use. On 3L O2 Abdomen:  Soft and nontender to palpation Musculoskeletal:  No obvious deformities Skin:  Warm and dry   Recent Labs Lab 10/28/12 1416 10/29/12 0435 10/30/12 0900  NA 137 140 136  K 4.4 4.1 4.0  CL 100 104 100  CO2 31 28 27   BUN 18 17 16    CREATININE 1.25 0.93 0.83  GLUCOSE 104* 180* 167*    Recent Labs Lab 10/28/12 1416 10/29/12 0435 10/30/12 0900  HGB 11.5* 11.4* 12.7*  HCT 34.1* 34.7* 36.7*  WBC 6.6 4.5 8.2  PLT 95* 97* 134*   ABG    Component Value Date/Time   PHART 7.346* 10/28/2012 1720   PCO2ART 53.8* 10/28/2012 1720   PO2ART 136.0* 10/28/2012 1720   HCO3 29.2* 10/28/2012 1720   TCO2 31 10/28/2012 1720   O2SAT 99.0 10/28/2012 1720   ASSESSMENT / PLAN:  Progressive RUL infiltrate likely from acute respiratory infection >> less likely TB or MAI.  Chronic hypercapnic respiratory failure >> likely from COPD/emphysema, now w/ AECOPD P:  -PFTs as outpatient when more stable -continue albuterol and atrovent -have set him up W Dr Kendrick Fries in [place] office as this is closer to home for him. (May 20) -slow taper steroids  -continue levaquin; being managed by primary and id -sputum culture, AFB, and Quantiferon gold -no indication for bronchoscopy at this time -keep O2 sats>92% -f/u CXR intermittently -will need to check Alpha 1 anti-trypsin levels at some point -need to ensure pneumococcal vaccination at some point  We will s/o call PRN   Billy Fischer, MD ; [facility] service Mobile [phone].  After 5:30 PM or weekends, call [phone]  10/30/2012, 12:48 PM

## 2012-10-30 NOTE — Progress Notes (Signed)
Subjective: Pt VSS. Pt subjectively much better. Decreased myalgias, arthralgias, cough, and sputum production.   Objective: Vital signs in last 24 hours: Filed Vitals:   10/29/12 2333 10/30/12 0426 10/30/12 0643 10/30/12 0733  BP:   143/76   Pulse:   65   Temp:   97.7 F (36.5 C)   TempSrc:   Oral   Resp:   16   Height:      Weight:   203 lb 11.3 oz (92.4 kg)   SpO2: 95% 95% 98% 96%   Weight change: 6 lb 9.8 oz (3 kg)  Intake/Output Summary (Last 24 hours) at 10/30/12 1120 Last data filed at 10/30/12 0845  Gross per 24 hour  Intake   3513 ml  Output   3975 ml  Net   -462 ml   General: resting in bed, anxious HEENT: PERRL, EOMI, bitemporal heminopsia, no scleral icterus Cardiac: RRR, no rubs, murmurs or gallops Pulm: some diffuse wheezes, move better volumes of air this AM compared to yesterday, no accessory muscle use, only nasal cannula Abd: soft, nontender, nondistended, BS present Ext: warm and well perfused, no diaphoresis, no pedal edema Neuro: alert and oriented X3  Lab Results: Basic Metabolic Panel:  Recent Labs Lab 10/29/12 0435 10/30/12 0900  NA 140 136  K 4.1 4.0  CL 104 100  CO2 28 27  GLUCOSE 180* 167*  BUN 17 16  CREATININE 0.93 0.83  CALCIUM 8.4 8.6   Liver Function Tests:  Recent Labs Lab 10/28/12 1933  AST 23  ALT 16  ALKPHOS 69  BILITOT 0.2*  PROT 6.0  ALBUMIN 3.0*   CBC:  Recent Labs Lab 10/28/12 1416 10/29/12 0435 10/30/12 0900  WBC 6.6 4.5 8.2  NEUTROABS 4.9  --   --   HGB 11.5* 11.4* 12.7*  HCT 34.1* 34.7* 36.7*  MCV 90.9 91.8 90.6  PLT 95* 97* 134*   Thyroid Function Tests:  Recent Labs Lab 10/28/12 1933  TSH 0.502   Coagulation:  Recent Labs Lab 10/29/12 0435  LABPROT 14.8  INR 1.18   Urine Drug Screen: Drugs of Abuse     Component Value Date/Time   LABOPIA NONE DETECTED 05/21/2009 0004   COCAINSCRNUR NONE DETECTED 05/21/2009 0004   LABBENZ POSITIVE* 05/21/2009 0004   AMPHETMU NONE DETECTED  05/21/2009 0004   THCU NONE DETECTED 05/21/2009 0004   LABBARB  Value: NONE DETECTED        DRUG SCREEN FOR MEDICAL PURPOSES ONLY.  IF CONFIRMATION IS NEEDED FOR ANY PURPOSE, NOTIFY LAB WITHIN 5 DAYS.        LOWEST DETECTABLE LIMITS FOR URINE DRUG SCREEN Drug Class       Cutoff (ng/mL) Amphetamine      1000 Barbiturate      200 Benzodiazepine   200 Tricyclics       300 Opiates          300 Cocaine          300 THC              50 05/21/2009 0004   Micro Results: Recent Results (from the past 240 hour(s))  CULTURE, EXPECTORATED SPUTUM-ASSESSMENT     Status: None   Collection Time    10/29/12  1:16 AM      Result Value Range Status   Specimen Description SPUTUM   Final   Special Requests Normal   Final   Sputum evaluation     Final   Value: THIS SPECIMEN IS ACCEPTABLE. RESPIRATORY  CULTURE REPORT TO FOLLOW.   Report Status 10/29/2012 FINAL   Final  CULTURE, RESPIRATORY (NON-EXPECTORATED)     Status: None   Collection Time    10/29/12  1:16 AM      Result Value Range Status   Specimen Description SPUTUM   Final   Special Requests ADDED 0154   Final   Gram Stain     Final   Value: MODERATE WBC PRESENT,BOTH PMN AND MONONUCLEAR     FEW SQUAMOUS EPITHELIAL CELLS PRESENT     MODERATE GRAM POSITIVE COCCI     IN PAIRS IN CHAINS FEW GRAM NEGATIVE RODS     RARE GRAM NEGATIVE COCCI   Culture NORMAL OROPHARYNGEAL FLORA   Final   Report Status PENDING   Incomplete  MRSA PCR SCREENING     Status: None   Collection Time    10/29/12  1:17 AM      Result Value Range Status   MRSA by PCR NEGATIVE  NEGATIVE Final   Comment:            The GeneXpert MRSA Assay (FDA     approved for NASAL specimens     only), is one component of a     comprehensive MRSA colonization     surveillance program. It is not     intended to diagnose MRSA     infection nor to guide or     monitor treatment for     MRSA infections.   Studies/Results: Dg Chest 2 View  10/28/2012  **ADDENDUM** CREATED: 10/28/2012 15:26:32   Findings discussed with Dr. Lynelle Doctor on 10/28/2012 at 3:29 p.m.  **END ADDENDUM** SIGNED BY: Richarda Overlie, M.D.   10/28/2012  *RADIOLOGY REPORT*  Clinical Data: Cough and shortness of breath.  CHEST - 2 VIEW  Comparison: 05/20/2009  Findings: Two views of the chest demonstrate mild hyperinflation. There are densities in the right lung apex which could be related to scarring.  However, these densities  have progressed and there is concern for a 1.3 cm nodular structure in this area. Interstitial markings are slightly prominent and may represent chronic changes.  Heart size is stable.  IMPRESSION: Right apical densities have progressed since the prior examination.  There may also be a nodular lesion which measures up to 1.3 cm.  Recommend further characterization with a chest CT.  Mild haziness in the lungs that could represent chronic changes.  Original Report Authenticated By: Richarda Overlie, M.D.    Ct Chest W Contrast  10/28/2012  *RADIOLOGY REPORT*  Clinical Data: Shortness of breath with cough, fever and chills. Possible right apical nodularity on radiographs.  CT CHEST WITH CONTRAST  Technique:  Multidetector CT imaging of the chest was performed following the standard protocol during bolus administration of intravenous contrast.  Contrast: 80mL OMNIPAQUE IOHEXOL 300 MG/ML  SOLN  Comparison: Prior chest radiographs 05/20/2009 and 10/28/2012. Cervical spine CT 02/13/2009.  Findings: There are partially calcified right hilar and subcarinal lymph nodes.  Noncalcified component of a subcarinal node measures 1.3 cm short axis on image 28.  There are mildly prominent non calcified precarinal nodes measuring up to 12 mm short axis.  There is no axillary adenopathy.  Contrast bolus is suboptimal.  There is central enlargement the pulmonary arteries consistent with pulmonary arterial hypertension. There is no evidence for pulmonary embolism on this non dedicated study.  There is no significant atherosclerosis.  A small amount  of pleural thickening or pleural fluid is present on the right.  There is no left pleural effusion or pericardial effusion.  There is extensive chronic lung disease with emphysema and paraseptal bleb formation.  The asymmetric density at the right apex noted on radiographs appears to most likely represent apical scarring, progressed from prior radiographs, but without focal mass or adjacent chest wall abnormality. There are scattered nodular densities with a tree-in-bud distribution, especially in the upper lobes.  There is a linear scarring atelectasis in the right lower lobe.  There is a 7 mm focal ground-glass opacity in the right middle lobe on image 29.  No dominant mass or endobronchial lesion is seen.  The visualized upper abdomen appears unremarkable.  There are no worrisome osseous findings.  IMPRESSION:  1.  No specific evidence of thoracic malignancy.  The asymmetric right apical density has an appearance most consistent with scarring. 2.  Extensive chronic lung disease with emphysema and additional areas of scarring.  Peribronchial nodularity has a tree-in-bud distribution and it is likely postinflammatory.  This can be seen with typical and atypical mycobacterial infection. 3.  Nonspecific prominent mediastinal and right hilar lymph nodes, some partially calcified.  This is likely related to prior granulomatous infection. 4.  Nonspecific small ground-glass opacity in the right middle lobe. Consider follow-up by chest CT without contrast in 3-6 months to confirm persistence.   This recommendation follows the consensus statement: Recommendations for the Management of Subsolid Pulmonary Nodules Detected at CT:  A Statement from the Fleischner Society as published in Radiology 2013; 266:304-317. 5.  Sternal nonunion.   Original Report Authenticated By: Carey Bullocks, M.D.    Mr Laqueta Jean AV Contrast  10/28/2012  *RADIOLOGY REPORT*  Clinical Data: Pituitary abnormality.  Headache.  Question pituitary  apoplexy?  MRI HEAD WITHOUT AND WITH CONTRAST  Technique:  Multiplanar, multiecho pulse sequences of the brain and surrounding structures were obtained according to standard protocol without and with intravenous contrast  Contrast: 20mL MULTIHANCE GADOBENATE DIMEGLUMINE 529 MG/ML IV SOLN  Comparison: 09/24/2010.  Findings: The patient had a cough and images are motion degraded.  No acute infarct.  No evidence of pituitary apoplexy.  Heterogeneous appearance of the pituitary gland.  Difficult to confirm or exclude a tiny microadenoma.  Scattered nonspecific white matter type changes similar to the prior exam.  This may be related to result of small vessel disease.  No MR evidence of intracranial hemorrhage.  No intracranial mass or abnormal enhancement.  Mild atrophy without hydrocephalus.  Major intracranial vascular structures are patent.  Paranasal sinus mucosal thickening.  Partial opacification ethmoid sinus air cells.  Cervical medullary junction, pineal region and orbital structures unremarkable.  IMPRESSION: The patient had a cough and images are motion degraded.  No acute infarct.  No evidence of pituitary apoplexy.  Heterogeneous appearance of the pituitary gland.  Difficult to confirm or exclude a tiny microadenoma.  Scattered nonspecific white matter type changes similar to the prior exam.  This may be related to result of small vessel disease.  No MR evidence of intracranial hemorrhage.  No intracranial mass or abnormal enhancement.  Paranasal sinus mucosal thickening.  Partial opacification ethmoid sinus air cells.   Original Report Authenticated By: Lacy Duverney, M.D.    Medications: I have reviewed the patient's current medications. Scheduled Meds: . albuterol  2.5 mg Nebulization Q4H  . FLUoxetine  40 mg Oral Daily  . gabapentin  600 mg Oral TID  . ipratropium  0.5 mg Nebulization Q4H  . levofloxacin (LEVAQUIN) IV  750 mg Intravenous Q24H  .  methylPREDNISolone (SOLU-MEDROL) injection  40 mg  Intravenous Q8H  . sodium chloride  3 mL Intravenous Q12H   Continuous Infusions: . sodium chloride 50 mL/hr at 10/30/12 1002   PRN Meds:.chlorpheniramine-HYDROcodone, guaiFENesin-dextromethorphan, LORazepam, morphine injection, ondansetron (ZOFRAN) IV, ondansetron Assessment/Plan: 1.  COPD exacerbation: Pt with sputum production , cough and SOB for past 5 days. Pt with nebs at home but did not use before presentation to ED. Pt states long hx of pulmonary infection and ground glass on CT ?interstitial lung disease. Pt also states that allergic to spiriva and advair inhalers therefore making outpt management more difficult.   -IV levofloxacin  -will need to ambulate to asses for home O2 needs -scheduled duo nebs  -transition to prednisone 60mg  for taper of 5 days -PCCM consulted and recommend PFTs as outpt, pneumococcal vaccine, and alpha-1 antitrypsin labs when stable.   2.Rule out acuteTB/ MAI: Pt with hx of possible risky behaviors and incarceration. CXR concerning mass in apical region with calcifications. Some Concern for TB given CT chest findings. First sputum culture normal and 2nd prelim normal oropharynx flora. HIV negative. pancultures NTD. Parvovirus pending. Influenza panel was negative.  -AFB sputum x3 pending -negative pressure room until sputum clears (1 already negative) -respiratory culture pending- prelim normal flora -ID recs much appreciated  3. Adrenal crisis-resolved less likely: Pt known hx of pituitary adenoma and adrenal insufficiency currently not on steroids for several years. Presented to ED hypotensive, abdominal pain and myalgia/arthralgia. Continues to not have electrolyte abnormalities and BP stablizing. Pt had similar presentation about 4 yrs ago with adrenal insufficiency. TSH normal.  -will probably need endocrinology as outpt   4. Pituitary apoplexy- ruled out: pt has known adenoma lost to follow up for 4-37yrs + usually saw Dr. Sharl Ma. No hx of gamma knife  procedure or surgery. Sudden severe HA with associated vision loss. MRI showed no evidence of apoplexy, intracranial mass, or hemorrhage. Heterogenous appearance consistent with previous known adenoma.   Dispo: Disposition is deferred at this time, awaiting improvement of current medical problems.  Anticipated discharge in approximately 3-4 day(s).   The patient does have a current PCP (EASON,ERNEST, MD), therefore will be requiring OPC follow-up after discharge.   The patient does not have transportation limitations that hinder transportation to clinic appointments.  .Services Needed at time of discharge: Y = Yes, Blank = No PT:   OT:   RN:   Equipment:   Other:     LOS: 2 days   Christen Bame, MD PGY-1 Pgr: [phone] 10/30/2012, 11:20 AM

## 2012-10-30 NOTE — Care Management Note (Addendum)
    Page 1 of 2   11/06/2012     4:39:07 PM   CARE MANAGEMENT NOTE 11/06/2012  Patient:  Steven Wiley, Steven Wiley   Account Number:  [id number]  Date Initiated:  10/29/2012  Documentation initiated by:  Donn Pierini  Subjective/Objective Assessment:   Pt admitted with Adrenal crisis, COPD- r/o TB     Action/Plan:   PTA pt lived at home with friends- independent- NCM to follow pt progression for d/c needs   Anticipated DC Date:  11/02/2012   Anticipated DC Plan:  HOME W HOME HEALTH SERVICES      DC Planning Services  CM consult      [facility] Choice  DURABLE MEDICAL EQUIPMENT   Choice offered to / List presented to:  C-1 Patient   DME arranged  [employer]  NEBULIZER MACHINE  NEBULIZER/MEDS      DME agency  [facility]  [facility]        Status of service:  Completed, signed off Medicare Important Message given?   (If response is "NO", the following Medicare IM given date fields will be blank) Date Medicare IM given:   Date Additional Medicare IM given:    Discharge Disposition:  HOME/SELF CARE  Per UR Regulation:  Reviewed for med. necessity/level of care/duration of stay  If discussed at Long Length of Stay Meetings, dates discussed:    Comments:  11/06/12 16:37 Letha Cape RN, BSN [phone] received call from [facility] with [facility] stating he found out today that [facility] has been trying to deliver the patient his oxygen and they have been unable to get in touch with patient.  11/02/12 15:19 Letha Cape RN, BSN [phone] patient is for dc today, pt qualified for oxygen , [facility] brought oxygen up to room,  and the nebulizer machine will be delivered to patient's home by [facility]  and [employer] will supply the nebulizer medications.  MD notified that the Dx code not on the nebulizer meds script and need to be resent to [employer].  10/30/12 16:38 Letha Cape RN, BSN [phone] patient lives with a friend at home.  spoke with patient he wanted to know how much his  medicaid will cover for his hospital bill, I informed him I will have financial counselor call him and it may be in the am, he said that would be ok.  I left message for Gearldine Bienenstock in financial counseling to call patient.  11/01/12 1436  Elmer Bales RN, MSN-Pt with order to transfer to acute care floor. Awaiting bed.

## 2012-10-31 ENCOUNTER — Inpatient Hospital Stay (HOSPITAL_COMMUNITY): Payer: Medicaid Other

## 2012-10-31 DIAGNOSIS — R1310 Dysphagia, unspecified: Secondary | ICD-10-CM

## 2012-10-31 DIAGNOSIS — B370 Candidal stomatitis: Secondary | ICD-10-CM

## 2012-10-31 DIAGNOSIS — J984 Other disorders of lung: Secondary | ICD-10-CM

## 2012-10-31 LAB — CULTURE, RESPIRATORY W GRAM STAIN: Culture: NORMAL

## 2012-10-31 LAB — CBC
HCT: 34.3 % — ABNORMAL LOW (ref 39.0–52.0)
Hemoglobin: 11.6 g/dL — ABNORMAL LOW (ref 13.0–17.0)
MCH: 30.6 pg (ref 26.0–34.0)
MCHC: 33.8 g/dL (ref 30.0–36.0)

## 2012-10-31 LAB — QUANTIFERON TB GOLD ASSAY (BLOOD)
Quantiferon Nil Value: 0.03 IU/mL
TB Ag value: 0.02 IU/mL

## 2012-10-31 LAB — BASIC METABOLIC PANEL
BUN: 18 mg/dL (ref 6–23)
CO2: 33 mEq/L — ABNORMAL HIGH (ref 19–32)
Chloride: 101 mEq/L (ref 96–112)
GFR calc Af Amer: >90 mL/min (ref >90)
Glucose, Bld: 139 mg/dL — ABNORMAL HIGH (ref 70–99)
Potassium: 3.6 mEq/L (ref 3.5–5.1)

## 2012-10-31 LAB — BLOOD GAS, ARTERIAL
Bicarbonate: 30.3 mEq/L — ABNORMAL HIGH (ref 20.0–24.0)
O2 Saturation: 94.8 %
pO2, Arterial: 68.8 mmHg — ABNORMAL LOW (ref 80.0–100.0)

## 2012-10-31 MED ORDER — POTASSIUM CHLORIDE CRYS ER 20 MEQ PO TBCR
40.0000 meq | EXTENDED_RELEASE_TABLET | Freq: Once | ORAL | Status: AC
  Administered 2012-10-31: 40 meq via ORAL
  Filled 2012-10-31: qty 2

## 2012-10-31 MED ORDER — ALBUTEROL SULFATE (5 MG/ML) 0.5% IN NEBU: 2.5000 mg | INHALATION_SOLUTION | RESPIRATORY_TRACT | Status: DC | PRN

## 2012-10-31 MED ORDER — ENOXAPARIN SODIUM 40 MG/0.4ML ~~LOC~~ SOLN
40.0000 mg | Freq: Every day | SUBCUTANEOUS | Status: DC
  Administered 2012-10-31 – 2012-11-02 (×3): 40 mg via SUBCUTANEOUS
  Filled 2012-10-31 (×3): qty 0.4

## 2012-10-31 MED ORDER — KETOROLAC TROMETHAMINE 30 MG/ML IJ SOLN
30.0000 mg | Freq: Once | INTRAMUSCULAR | Status: AC
  Administered 2012-10-31: 30 mg via INTRAVENOUS
  Filled 2012-10-31: qty 1

## 2012-10-31 MED ORDER — NYSTATIN 100000 UNIT/ML MT SUSP
5.0000 mL | Freq: Four times a day (QID) | OROMUCOSAL | Status: DC
  Administered 2012-10-31 – 2012-11-02 (×9): 500000 [IU] via OROMUCOSAL
  Filled 2012-10-31 (×13): qty 5

## 2012-10-31 MED ORDER — ALBUTEROL (5 MG/ML) CONTINUOUS INHALATION SOLN
10.0000 mg/h | INHALATION_SOLUTION | RESPIRATORY_TRACT | Status: DC
  Filled 2012-10-31: qty 20

## 2012-10-31 MED ORDER — IPRATROPIUM BROMIDE 0.02 % IN SOLN
0.5000 mg | Freq: Four times a day (QID) | RESPIRATORY_TRACT | Status: DC
  Administered 2012-10-31 – 2012-11-02 (×9): 0.5 mg via RESPIRATORY_TRACT
  Filled 2012-10-31 (×9): qty 2.5

## 2012-10-31 MED ORDER — MAGNESIUM SULFATE 40 MG/ML IJ SOLN
2.0000 g | Freq: Once | INTRAMUSCULAR | Status: AC
  Administered 2012-10-31: 2 g via INTRAVENOUS
  Filled 2012-10-31: qty 50

## 2012-10-31 MED ORDER — FUROSEMIDE 10 MG/ML IJ SOLN
40.0000 mg | INTRAMUSCULAR | Status: AC
  Administered 2012-10-31: 40 mg via INTRAVENOUS
  Filled 2012-10-31: qty 4

## 2012-10-31 MED ORDER — ALBUTEROL SULFATE (5 MG/ML) 0.5% IN NEBU
2.5000 mg | INHALATION_SOLUTION | Freq: Four times a day (QID) | RESPIRATORY_TRACT | Status: DC
  Administered 2012-10-31 – 2012-11-02 (×9): 2.5 mg via RESPIRATORY_TRACT
  Filled 2012-10-31 (×9): qty 0.5

## 2012-10-31 MED ORDER — HYDROCOD POLST-CHLORPHEN POLST 10-8 MG/5ML PO LQCR
5.0000 mL | Freq: Two times a day (BID) | ORAL | Status: DC
  Administered 2012-10-31 – 2012-11-02 (×5): 5 mL via ORAL
  Filled 2012-10-31: qty 5

## 2012-10-31 MED ORDER — MORPHINE SULFATE 2 MG/ML IJ SOLN
2.0000 mg | Freq: Once | INTRAMUSCULAR | Status: AC
  Administered 2012-10-31: 2 mg via INTRAVENOUS
  Filled 2012-10-31: qty 1

## 2012-10-31 MED ORDER — METHYLPREDNISOLONE SODIUM SUCC 40 MG IJ SOLR
40.0000 mg | Freq: Three times a day (TID) | INTRAMUSCULAR | Status: DC
  Administered 2012-10-31 – 2012-11-01 (×3): 40 mg via INTRAVENOUS
  Filled 2012-10-31 (×6): qty 1

## 2012-10-31 MED ORDER — FLUCONAZOLE 100 MG PO TABS
100.0000 mg | ORAL_TABLET | Freq: Every day | ORAL | Status: AC
  Administered 2012-10-31 – 2012-11-02 (×3): 100 mg via ORAL
  Filled 2012-10-31 (×4): qty 1

## 2012-10-31 MED ORDER — TRAZODONE HCL 100 MG PO TABS
200.0000 mg | ORAL_TABLET | Freq: Every day | ORAL | Status: DC
  Administered 2012-10-31 – 2012-11-01 (×2): 200 mg via ORAL
  Filled 2012-10-31 (×4): qty 2

## 2012-10-31 NOTE — Progress Notes (Signed)
Subjective: Pt in acute respiratory distress this AM having increase SOB, accessory muscle use and anxiety.   Objective: Vital signs in last 24 hours: Filed Vitals:   10/31/12 0444 10/31/12 0837 10/31/12 1319 10/31/12 1408  BP:   121/74   Pulse:   84   Temp:   98.4 F (36.9 C)   TempSrc:   Oral   Resp:   22   Height:      Weight: 208 lb 15.9 oz (94.8 kg)     SpO2:  98% 93% 95%   Weight change: 10.6 oz (0.3 kg)  Intake/Output Summary (Last 24 hours) at 10/31/12 1427 Last data filed at 10/31/12 1300  Gross per 24 hour  Intake 1692.5 ml  Output   1700 ml  Net   -7.5 ml   General: resting in bed, anxious HEENT: PERRL, EOMI, bitemporal heminopsia, no scleral icterus Cardiac: RRR, no rubs, murmurs or gallops Pulm: some diffuse wheezes, move better volumes of air this AM compared to yesterday, no accessory muscle use, only nasal cannula Abd: soft, nontender, nondistended, BS present Ext: warm and well perfused, no diaphoresis, no pedal edema Neuro: alert and oriented X3  Lab Results: Basic Metabolic Panel:  Recent Labs Lab 10/30/12 0900 10/31/12 0551  NA 136 138  K 4.0 3.6  CL 100 101  CO2 27 33*  GLUCOSE 167* 139*  BUN 16 18  CREATININE 0.83 1.02  CALCIUM 8.6 8.1*   Liver Function Tests:  Recent Labs Lab 10/28/12 1933  AST 23  ALT 16  ALKPHOS 69  BILITOT 0.2*  PROT 6.0  ALBUMIN 3.0*   CBC:  Recent Labs Lab 10/28/12 1416  10/30/12 0900 10/31/12 0551  WBC 6.6  < > 8.2 9.5  NEUTROABS 4.9  --   --   --   HGB 11.5*  < > 12.7* 11.6*  HCT 34.1*  < > 36.7* 34.3*  MCV 90.9  < > 90.6 90.5  PLT 95*  < > 134* 140*  < > = values in this interval not displayed. Thyroid Function Tests:  Recent Labs Lab 10/28/12 1933  TSH 0.502   Coagulation:  Recent Labs Lab 10/29/12 0435  LABPROT 14.8  INR 1.18   Urine Drug Screen: Drugs of Abuse     Component Value Date/Time   LABOPIA NONE DETECTED 05/21/2009 0004   COCAINSCRNUR NONE DETECTED 05/21/2009  0004   LABBENZ POSITIVE* 05/21/2009 0004   AMPHETMU NONE DETECTED 05/21/2009 0004   THCU NONE DETECTED 05/21/2009 0004   LABBARB  Value: NONE DETECTED        DRUG SCREEN FOR MEDICAL PURPOSES ONLY.  IF CONFIRMATION IS NEEDED FOR ANY PURPOSE, NOTIFY LAB WITHIN 5 DAYS.        LOWEST DETECTABLE LIMITS FOR URINE DRUG SCREEN Drug Class       Cutoff (ng/mL) Amphetamine      1000 Barbiturate      200 Benzodiazepine   200 Tricyclics       300 Opiates          300 Cocaine          300 THC              50 05/21/2009 0004   Micro Results: Recent Results (from the past 240 hour(s))  CULTURE, EXPECTORATED SPUTUM-ASSESSMENT     Status: None   Collection Time    10/29/12  1:16 AM      Result Value Range Status   Specimen Description SPUTUM   Final  Special Requests Normal   Final   Sputum evaluation     Final   Value: THIS SPECIMEN IS ACCEPTABLE. RESPIRATORY CULTURE REPORT TO FOLLOW.   Report Status 10/29/2012 FINAL   Final  CULTURE, RESPIRATORY (NON-EXPECTORATED)     Status: None   Collection Time    10/29/12  1:16 AM      Result Value Range Status   Specimen Description SPUTUM   Final   Special Requests ADDED 0154   Final   Gram Stain     Final   Value: MODERATE WBC PRESENT,BOTH PMN AND MONONUCLEAR     FEW SQUAMOUS EPITHELIAL CELLS PRESENT     MODERATE GRAM POSITIVE COCCI     IN PAIRS IN CHAINS FEW GRAM NEGATIVE RODS     RARE GRAM NEGATIVE COCCI   Culture NORMAL OROPHARYNGEAL FLORA   Final   Report Status 10/31/2012 FINAL   Final  MRSA PCR SCREENING     Status: None   Collection Time    10/29/12  1:17 AM      Result Value Range Status   MRSA by PCR NEGATIVE  NEGATIVE Final   Comment:            The GeneXpert MRSA Assay (FDA     approved for NASAL specimens     only), is one component of a     comprehensive MRSA colonization     surveillance program. It is not     intended to diagnose MRSA     infection nor to guide or     monitor treatment for     MRSA infections.  AFB CULTURE WITH  SMEAR     Status: None   Collection Time    10/29/12 10:27 AM      Result Value Range Status   Specimen Description SPUTUM   Final   Special Requests NONE   Final   ACID FAST SMEAR NO ACID FAST BACILLI SEEN   Final   Culture     Final   Value: CULTURE WILL BE EXAMINED FOR 6 WEEKS BEFORE ISSUING A FINAL REPORT   Report Status PENDING   Incomplete   Studies/Results: Dg Chest Port 1 View  10/31/2012  *RADIOLOGY REPORT*  Clinical Data: Respiratory distress, increased shortness of breath  PORTABLE CHEST - 1 VIEW  Comparison: 10/28/2012; 05/20/2009; chest CT - 10/28/2012  Findings: Grossly unchanged cardiac silhouette and mediastinal contours.  The lungs remain hyperexpanded with flattening of bilateral hemidiaphragms and mild diffuse thickening of the interstitium.  There is grossly unchanged asymmetric right apical pleuroparenchymal thickening.  Unchanged blunting of the right costophrenic angle without definite pleural effusion.  No pneumothorax.  Unchanged bones.  IMPRESSION: 1.  Grossly stable findings of advanced emphysematous and bronchitic change without acute cardiopulmonary disease. 2.  Grossly unchanged right apical pleural parenchymal thickening and blunting of the right costophrenic angle.   Original Report Authenticated By: Tacey Ruiz, MD    Medications: I have reviewed the patient's current medications. Scheduled Meds: . albuterol  2.5 mg Nebulization Q6H  . chlorpheniramine-HYDROcodone  5 mL Oral Q12H  . enoxaparin (LOVENOX) injection  40 mg Subcutaneous Daily  . fluconazole  100 mg Oral Daily  . FLUoxetine  40 mg Oral Daily  . gabapentin  600 mg Oral TID  . ipratropium  0.5 mg Nebulization Q6H  . levofloxacin  500 mg Oral Q24H  . methylPREDNISolone (SOLU-MEDROL) injection  40 mg Intravenous Q8H  . nystatin  5 mL Mouth/Throat QID  .  sodium chloride  3 mL Intravenous Q12H  . traZODone  200 mg Oral QHS   Continuous Infusions:   PRN Meds:.albuterol,  chlorpheniramine-HYDROcodone, guaiFENesin-dextromethorphan, LORazepam, morphine injection, ondansetron (ZOFRAN) IV, ondansetron Assessment/Plan: 1.  Acute respiratory distress: ?Hypoxic event this AM as low as 70s per nursing. ABG calculated PaO2/FiO2 217 indicating mild ARDS/ALI. Less likely this is 2/2 to COPD exacerbation as pt was improving and wouldn't cause such sudden acute decompensation. Pt other vitals stable. PE worsening decreased BS throughout with still marked wheezes. ? Acute cardiopulmonary process, acute pulmonary edema. CXR some increased patchy infiltrates from admission.  -step down bed -ECHO -trops x 3 -repeat CXR in AM -PCCM made aware -albuterol nebs continuous  -IV solumedrol 40mg  q8 -cont levofloxacin  2. COPD exacerbation: Pt with sputum production , cough and SOB for past 5 days. Pt with nebs at home but did not use before presentation to ED. Pt states long hx of pulmonary infection and ground glass on CT ?interstitial lung disease. Pt also states that allergic to spiriva and advair inhalers therefore making outpt management more difficult.   -IV levofloxacin  -PCCM consulted and recommend PFTs as outpt, pneumococcal vaccine, and alpha-1 antitrypsin labs when stable.   2.Rule out acuteTB/ MAI: Pt with hx of possible risky behaviors and incarceration. CXR concerning mass in apical region with calcifications. Some Concern for TB given CT chest findings. First sputum culture normal and 2nd prelim normal oropharynx flora. First AFB negative. HIV negative. pancultures NTD. Parvovirus pending. Influenza panel was negative.  -AFB sputum x3 pending -negative pressure room until sputum clears (1 already negative) -respiratory culture pending- prelim normal flora -ID recs much appreciated  3. Adrenal crisis-resolved less likely: Pt known hx of pituitary adenoma and adrenal insufficiency currently not on steroids for several years. Presented to ED hypotensive, abdominal pain and  myalgia/arthralgia. Continues to not have electrolyte abnormalities and BP stablizing. Pt had similar presentation about 4 yrs ago with adrenal insufficiency. TSH normal.  -will probably need endocrinology as outpt   4. Pituitary apoplexy- ruled out: pt has known adenoma lost to follow up for 4-58yrs + usually saw Dr. Sharl Ma. No hx of gamma knife procedure or surgery. Sudden severe HA with associated vision loss. MRI showed no evidence of apoplexy, intracranial mass, or hemorrhage. Heterogenous appearance consistent with previous known adenoma.   Dispo: Disposition is deferred at this time, awaiting improvement of current medical problems.  Anticipated discharge in approximately 3-4 day(s).   The patient does have a current PCP (EASON,ERNEST, MD), therefore will be requiring OPC follow-up after discharge.   The patient does not have transportation limitations that hinder transportation to clinic appointments.  .Services Needed at time of discharge: Y = Yes, Blank = No PT:   OT:   RN:   Equipment:   Other:     LOS: 3 days   Christen Bame, MD PGY-1 Pgr: [phone] 10/31/2012, 2:27 PM

## 2012-10-31 NOTE — Progress Notes (Addendum)
Patient complained of being SOB and having a difficult time breathing. Respiratory therapy remained at the bedside and both doctor and rapid response was notified of patient present status. Report was given to [facility] RN from unit 3300. Patient was transported to 3301 by the rapid response nurse and unit tech. Spoke with MD [facility] about the frequency of the patients nebulizer treatment. No additional orders were given.

## 2012-10-31 NOTE — Progress Notes (Signed)
INFECTIOUS DISEASE PROGRESS NOTE  ID: Notnamed Scholz is a 57 y.o. male with  Principal Problem:   COPD exacerbation Active Problems:   Adrenal insufficiency   Acute adrenal crisis   Pituitary adenoma   Anxiety disorder   Acute-on-chronic respiratory failure   Odynophagia   Oral thrush  Subjective: Worsening hypoxia today. Now in 3300.  He states he feels much better than he did yesterday.   Abtx:  Anti-infectives   Start     Dose/Rate Route Frequency Ordered Stop   10/31/12 1300  fluconazole (DIFLUCAN) tablet 100 mg     100 mg Oral Daily 10/31/12 1153 11/03/12 0959   10/30/12 2200  levofloxacin (LEVAQUIN) tablet 500 mg     500 mg Oral Every 24 hours 10/30/12 1516     10/28/12 2100  levofloxacin (LEVAQUIN) IVPB 750 mg  Status:  Discontinued     750 mg 100 mL/hr over 90 Minutes Intravenous Every 24 hours 10/28/12 2008 10/30/12 1516      Medications:  Scheduled: . albuterol  2.5 mg Nebulization Q6H  . chlorpheniramine-HYDROcodone  5 mL Oral Q12H  . enoxaparin (LOVENOX) injection  40 mg Subcutaneous Daily  . fluconazole  100 mg Oral Daily  . FLUoxetine  40 mg Oral Daily  . gabapentin  600 mg Oral TID  . ipratropium  0.5 mg Nebulization Q6H  . levofloxacin  500 mg Oral Q24H  . methylPREDNISolone (SOLU-MEDROL) injection  40 mg Intravenous Q8H  . nystatin  5 mL Mouth/Throat QID  . sodium chloride  3 mL Intravenous Q12H  . traZODone  200 mg Oral QHS    Objective: Vital signs in last 24 hours: Temp:  [97.8 F (36.6 C)-98.4 F (36.9 C)] 97.8 F (36.6 C) (05/07 1623) Pulse Rate:  [70-84] 70 (05/07 1935) Resp:  [16-22] 18 (05/07 1624) BP: (121-158)/(73-79) 127/79 mmHg (05/07 1935) SpO2:  [93 %-99 %] 97 % (05/07 1935) Weight:  [94.8 kg (208 lb 15.9 oz)] 94.8 kg (208 lb 15.9 oz) (05/07 0444)   General appearance: alert, cooperative and no distress Resp: rhonchi bilaterally Cardio: regular rate and rhythm GI: normal findings: bowel sounds normal and soft,  non-tender  Lab Results  Recent Labs  10/30/12 0900 10/31/12 0551  WBC 8.2 9.5  HGB 12.7* 11.6*  HCT 36.7* 34.3*  NA 136 138  K 4.0 3.6  CL 100 101  CO2 27 33*  BUN 16 18  CREATININE 0.83 1.02   Liver Panel No results found for this basename: PROT, ALBUMIN, AST, ALT, ALKPHOS, BILITOT, BILIDIR, IBILI,  in the last 72 hours Sedimentation Rate No results found for this basename: ESRSEDRATE,  in the last 72 hours C-Reactive Protein No results found for this basename: CRP,  in the last 72 hours  Microbiology: Recent Results (from the past 240 hour(s))  CULTURE, EXPECTORATED SPUTUM-ASSESSMENT     Status: None   Collection Time    10/29/12  1:16 AM      Result Value Range Status   Specimen Description SPUTUM   Final   Special Requests Normal   Final   Sputum evaluation     Final   Value: THIS SPECIMEN IS ACCEPTABLE. RESPIRATORY CULTURE REPORT TO FOLLOW.   Report Status 10/29/2012 FINAL   Final  CULTURE, RESPIRATORY (NON-EXPECTORATED)     Status: None   Collection Time    10/29/12  1:16 AM      Result Value Range Status   Specimen Description SPUTUM   Final   Special Requests  ADDED 0154   Final   Gram Stain     Final   Value: MODERATE WBC PRESENT,BOTH PMN AND MONONUCLEAR     FEW SQUAMOUS EPITHELIAL CELLS PRESENT     MODERATE GRAM POSITIVE COCCI     IN PAIRS IN CHAINS FEW GRAM NEGATIVE RODS     RARE GRAM NEGATIVE COCCI   Culture NORMAL OROPHARYNGEAL FLORA   Final   Report Status 10/31/2012 FINAL   Final  MRSA PCR SCREENING     Status: None   Collection Time    10/29/12  1:17 AM      Result Value Range Status   MRSA by PCR NEGATIVE  NEGATIVE Final   Comment:            The GeneXpert MRSA Assay (FDA     approved for NASAL specimens     only), is one component of a     comprehensive MRSA colonization     surveillance program. It is not     intended to diagnose MRSA     infection nor to guide or     monitor treatment for     MRSA infections.  AFB CULTURE WITH SMEAR      Status: None   Collection Time    10/29/12 10:27 AM      Result Value Range Status   Specimen Description SPUTUM   Final   Special Requests NONE   Final   ACID FAST SMEAR NO ACID FAST BACILLI SEEN   Final   Culture     Final   Value: CULTURE WILL BE EXAMINED FOR 6 WEEKS BEFORE ISSUING A FINAL REPORT   Report Status PENDING   Incomplete  AFB CULTURE WITH SMEAR     Status: None   Collection Time    10/30/12  1:17 PM      Result Value Range Status   Specimen Description SPUTUM   Final   Special Requests NONE   Final   ACID FAST SMEAR NO ACID FAST BACILLI SEEN   Final   Culture     Final   Value: CULTURE WILL BE EXAMINED FOR 6 WEEKS BEFORE ISSUING A FINAL REPORT   Report Status PENDING   Incomplete    Studies/Results: Dg Chest Port 1 View  10/31/2012  *RADIOLOGY REPORT*  Clinical Data: Respiratory distress, increased shortness of breath  PORTABLE CHEST - 1 VIEW  Comparison: 10/28/2012; 05/20/2009; chest CT - 10/28/2012  Findings: Grossly unchanged cardiac silhouette and mediastinal contours.  The lungs remain hyperexpanded with flattening of bilateral hemidiaphragms and mild diffuse thickening of the interstitium.  There is grossly unchanged asymmetric right apical pleuroparenchymal thickening.  Unchanged blunting of the right costophrenic angle without definite pleural effusion.  No pneumothorax.  Unchanged bones.  IMPRESSION: 1.  Grossly stable findings of advanced emphysematous and bronchitic change without acute cardiopulmonary disease. 2.  Grossly unchanged right apical pleural parenchymal thickening and blunting of the right costophrenic angle.   Original Report Authenticated By: Tacey Ruiz, MD      Assessment/Plan: COPD  R apical density   quantiferon pending.  afb stains- #1 negative, #2 negative Will send repeat sputum (d/i respiratory)  Total days of antibiotics: 4 (levaquin)   Johny Sax Infectious Diseases [phone] 10/31/2012, 7:39 PM   LOS: 3 days

## 2012-10-31 NOTE — Progress Notes (Signed)
PULMONARY  / CRITICAL CARE MEDICINE  Name: Steven Wiley MRN: [id number] DOB: [birth year]    ADMISSION DATE:  10/28/2012 CONSULTATION DATE:  10/29/2012  REFERRING MD :  Eben Burow PRIMARY SERVICE:  [facility]  CHIEF COMPLAINT:  SOB and cough  BRIEF PATIENT DESCRIPTION: 57 yo M with hx of COPD presents with increasing SOB and productive cough x 5 days  SIGNIFICANT EVENTS / STUDIES:  5/4 chest CT>>>No evidence of thoracic malignancy. Right apical density appearance most consistent w/ scarring. Extensive chronic lung disease w/ emphysema and additional areas of scarring. Peribronchial nodularity has a tree-in-bud distribution and is likely postinflammatory. Nonspecific prominent mediastinal and right hilar lymph nodes, some partially calcified. This is likely related to prior granulomatous infection. Nonspecific small ground-glass opacity in the right middle lobe.   LINES / TUBES: Peripheral IV  CULTURES: 5/5 AFB culture>>>neg AFB 5/6 AFB culture>>> 5/5 sputum culture>>>nml flora 5/5 Quantiferon gold>>>  ANTIBIOTICS: 5/4 Levaquin>>>   SUBJECTIVE:  Pt complains of sore throat - "feels raw".  Post episode of significant SOB with associated anxiety.   VITAL SIGNS: Temp:  [97.7 F (36.5 C)-98.4 F (36.9 C)] 98.4 F (36.9 C) (05/07 0437) Pulse Rate:  [70-73] 70 (05/07 0437) Resp:  [16-18] 16 (05/06 2258) BP: (103-158)/(47-78) 126/73 mmHg (05/07 0437) SpO2:  [93 %-99 %] 98 % (05/07 0837) Weight:  [208 lb 15.9 oz (94.8 kg)] 208 lb 15.9 oz (94.8 kg) (05/07 0444) 3 liters  PHYSICAL EXAMINATION: General:  Awake and alert in NAD, anxious Neuro:  No focal deficits HEENT:  PERRL. Oral mucosa moist Neck:  supple Cardiovascular:  RRR. No m/g/r Lungs:  No respiratory distress. Coarse bilaterally, inspiratory and expiratory wheeze. No accessory muscle use. On 3L O2 Abdomen:  Soft and nontender to palpation Musculoskeletal:  No acute deformities Skin:  Warm and dry   Recent Labs Lab 10/29/12 0435  10/30/12 0900 10/31/12 0551  NA 140 136 138  K 4.1 4.0 3.6  CL 104 100 101  CO2 28 27 33*  BUN 17 16 18   CREATININE 0.93 0.83 1.02  GLUCOSE 180* 167* 139*    Recent Labs Lab 10/29/12 0435 10/30/12 0900 10/31/12 0551  HGB 11.4* 12.7* 11.6*  HCT 34.7* 36.7* 34.3*  WBC 4.5 8.2 9.5  PLT 97* 134* 140*   ABG    Component Value Date/Time   PHART 7.412 10/31/2012 0932   PCO2ART 48.5* 10/31/2012 0932   PO2ART 68.8* 10/31/2012 0932   HCO3 30.3* 10/31/2012 0932   TCO2 31.8 10/31/2012 0932   O2SAT 94.8 10/31/2012 0932   ASSESSMENT / PLAN:  Progressive RUL infiltrate likely from acute respiratory infection >> less likely TB or MAI.  Chronic hypercapnic respiratory failure >> likely from COPD/emphysema, now w/ AECOPD  P:  -PFTs as outpatient when more stable -adjust albuterol and atrovent -have set him up W Dr Kendrick Fries in [place] office as this is closer to home for him. (May 20) -slow taper steroids -->back to IV Steroids 5/7 -continue levaquin; being managed by primary and ID -sputum culture, AFB, and Quantiferon gold -no indication for bronchoscopy at this time -keep O2 sats>92% -f/u CXR intermittently -will need to check Alpha 1 anti-trypsin levels at some point -need to ensure pneumococcal vaccination  -hold CAT neb as likely will make him more anxious -lasix x1 dose with kcl -ECHO per primary -IV toradol x1 for pleuritic chest pain secondary to cough, f/u bmp to review renal fxn   Oral Thrush - related to oral steroid usage.  Has been on  as outpatient.   P: -nystatin swish / swallow for 7 days  -diflucan for 3 days -tussionex for 3 days for cough suppression   Canary Brim, NP-C [facility] Pgr: [phone] or [phone]  Billy Fischer, MD ; [facility] service [place] [phone].  After 5:30 PM or weekends, call [phone]  10/31/2012, 10:22 AM

## 2012-10-31 NOTE — Progress Notes (Signed)
Internal Medicine Teaching Service Attending Note Date: 10/31/2012  Patient name: Steven Wiley  Medical record number: [id number]  Date of birth: 1956-04-25    This patient has been seen and discussed with the house staff. Please see their note for complete details. I concur with their findings with the following additions/corrections: Steven Wiley continues to have respiratory distress and b/l chest pain from severe coughing.  BP 126/73  Pulse 70  Temp(Src) 98.4 F (36.9 C) (Oral)  Resp 16  Ht 5\' 10"  (1.778 m)  Wt 208 lb 15.9 oz (94.8 kg)  BMI 29.99 kg/m2  SpO2 98% Physical Exam: General: Vital signs reviewed and noted. Extremely anxious and coughing  Throat: Oropharynx nonerythematous, no exudate appreciated.   Neck: No deformities, masses, or tenderness noted.Supple, No carotid Bruits, no JVD.  Lungs:  Normal respiratory effort. Diffuse expiratory wheezing which is better than yesterday, no crackles  Heart: RRR. S1 and S2 normal without gallop, murmur, or rubs.  Abdomen:  BS normoactive. Soft, Nondistended, non-tender.  No masses or organomegaly.  Extremities: No pretibial edema.  Neurologic: A&O X3, CN II - XII are grossly intact. Motor strength is 5/5 in the all 4 extremities, Sensations intact to light touch, Cerebellar signs negative.  Skin: No visible rashes, scars.   Labs and cxr reviewed.   a and p:  -Keep O2 sats 89-94% and avoid to get it above 95 to avoid respiratory depression -Get ABG stat -D/C IVF -Give lasix 40 mg IV stat as patient seems to be congested on CXR -Agree with transfer to stepdown  Continue other treatment as per resident's note.  Lars Mage 10/31/2012, 10:21 AM

## 2012-11-01 ENCOUNTER — Inpatient Hospital Stay (HOSPITAL_COMMUNITY): Payer: Medicaid Other

## 2012-11-01 LAB — EXPECTORATED SPUTUM ASSESSMENT W GRAM STAIN, RFLX TO RESP C

## 2012-11-01 LAB — BASIC METABOLIC PANEL
BUN: 24 mg/dL — ABNORMAL HIGH (ref 6–23)
Chloride: 100 mEq/L (ref 96–112)
Creatinine, Ser: 1.01 mg/dL (ref 0.50–1.35)
GFR calc Af Amer: >90 mL/min (ref >90)
GFR calc non Af Amer: 80 mL/min — ABNORMAL LOW (ref >90)
Potassium: 4.8 mEq/L (ref 3.5–5.1)

## 2012-11-01 LAB — CBC
Hemoglobin: 12.6 g/dL — ABNORMAL LOW (ref 13.0–17.0)
MCHC: 34.4 g/dL (ref 30.0–36.0)
RDW: 13.9 % (ref 11.5–15.5)
WBC: 9.4 10*3/uL (ref 4.0–10.5)

## 2012-11-01 LAB — PARVOVIRUS B19 ANTIBODY, IGG AND IGM
Parovirus B19 IgG Abs: 0.8 {index}
Parovirus B19 IgM Abs: 0.1 {index}

## 2012-11-01 MED ORDER — METHYLPREDNISOLONE SODIUM SUCC 40 MG IJ SOLR
40.0000 mg | Freq: Two times a day (BID) | INTRAMUSCULAR | Status: DC
  Administered 2012-11-01 – 2012-11-02 (×2): 40 mg via INTRAVENOUS
  Filled 2012-11-01 (×4): qty 1

## 2012-11-01 MED ORDER — BUDESONIDE 0.25 MG/2ML IN SUSP
0.2500 mg | Freq: Four times a day (QID) | RESPIRATORY_TRACT | Status: DC
  Administered 2012-11-01 – 2012-11-02 (×4): 0.25 mg via RESPIRATORY_TRACT
  Filled 2012-11-01 (×9): qty 2

## 2012-11-01 MED ORDER — BUDESONIDE 0.25 MG/2ML IN SUSP
0.2500 mg | Freq: Four times a day (QID) | RESPIRATORY_TRACT | Status: DC
  Filled 2012-11-01 (×4): qty 2

## 2012-11-01 NOTE — Progress Notes (Signed)
Pt refused cough med and asked that RN comes back in an hour- RN went to give med-pt asleep

## 2012-11-01 NOTE — Progress Notes (Addendum)
INFECTIOUS DISEASE PROGRESS NOTE  ID: Steven Wiley is a 57 y.o. male with  Principal Problem:   COPD exacerbation Active Problems:   Adrenal insufficiency   Acute adrenal crisis   Pituitary adenoma   Anxiety disorder   Acute-on-chronic respiratory failure   Odynophagia   Oral thrush  Subjective: continued productive cough although pt feels it is "lighter" in color  Abtx:  Anti-infectives   Start     Dose/Rate Route Frequency Ordered Stop   10/31/12 1300  fluconazole (DIFLUCAN) tablet 100 mg     100 mg Oral Daily 10/31/12 1153 11/03/12 0959   10/30/12 2200  levofloxacin (LEVAQUIN) tablet 500 mg     500 mg Oral Every 24 hours 10/30/12 1516     10/28/12 2100  levofloxacin (LEVAQUIN) IVPB 750 mg  Status:  Discontinued     750 mg 100 mL/hr over 90 Minutes Intravenous Every 24 hours 10/28/12 2008 10/30/12 1516      Medications:  Scheduled: . albuterol  2.5 mg Nebulization Q6H  . chlorpheniramine-HYDROcodone  5 mL Oral Q12H  . enoxaparin (LOVENOX) injection  40 mg Subcutaneous Daily  . fluconazole  100 mg Oral Daily  . FLUoxetine  40 mg Oral Daily  . gabapentin  600 mg Oral TID  . ipratropium  0.5 mg Nebulization Q6H  . levofloxacin  500 mg Oral Q24H  . methylPREDNISolone (SOLU-MEDROL) injection  40 mg Intravenous Q8H  . nystatin  5 mL Mouth/Throat QID  . sodium chloride  3 mL Intravenous Q12H  . traZODone  200 mg Oral QHS    Objective: Vital signs in last 24 hours: Temp:  [97.8 F (36.6 C)-99 F (37.2 C)] 98.6 F (37 C) (05/08 1478) Pulse Rate:  [70-84] 74 (05/08 0808) Resp:  [13-22] 19 (05/08 0808) BP: (121-151)/(46-79) 151/68 mmHg (05/08 0808) SpO2:  [92 %-97 %] 94 % (05/08 0808) Weight:  [92 kg (202 lb 13.2 oz)] 92 kg (202 lb 13.2 oz) (05/08 0323)   General appearance: alert, cooperative and no distress Resp: rhonchi bilaterally Cardio: regular rate and rhythm GI: normal findings: bowel sounds normal and soft, non-tender Extremities: edema none  Lab  Results  Recent Labs  10/31/12 0551 11/01/12 0410  WBC 9.5 9.4  HGB 11.6* 12.6*  HCT 34.3* 36.6*  NA 138 138  K 3.6 4.8  CL 101 100  CO2 33* 29  BUN 18 24*  CREATININE 1.02 1.01   Liver Panel No results found for this basename: PROT, ALBUMIN, AST, ALT, ALKPHOS, BILITOT, BILIDIR, IBILI,  in the last 72 hours Sedimentation Rate No results found for this basename: ESRSEDRATE,  in the last 72 hours C-Reactive Protein No results found for this basename: CRP,  in the last 72 hours  Microbiology: Recent Results (from the past 240 hour(s))  CULTURE, EXPECTORATED SPUTUM-ASSESSMENT     Status: None   Collection Time    10/29/12  1:16 AM      Result Value Range Status   Specimen Description SPUTUM   Final   Special Requests Normal   Final   Sputum evaluation     Final   Value: THIS SPECIMEN IS ACCEPTABLE. RESPIRATORY CULTURE REPORT TO FOLLOW.   Report Status 10/29/2012 FINAL   Final  CULTURE, RESPIRATORY (NON-EXPECTORATED)     Status: None   Collection Time    10/29/12  1:16 AM      Result Value Range Status   Specimen Description SPUTUM   Final   Special Requests ADDED [phone]  Final   Gram Stain     Final   Value: MODERATE WBC PRESENT,BOTH PMN AND MONONUCLEAR     FEW SQUAMOUS EPITHELIAL CELLS PRESENT     MODERATE GRAM POSITIVE COCCI     IN PAIRS IN CHAINS FEW GRAM NEGATIVE RODS     RARE GRAM NEGATIVE COCCI   Culture NORMAL OROPHARYNGEAL FLORA   Final   Report Status 10/31/2012 FINAL   Final  MRSA PCR SCREENING     Status: None   Collection Time    10/29/12  1:17 AM      Result Value Range Status   MRSA by PCR NEGATIVE  NEGATIVE Final   Comment:            The GeneXpert MRSA Assay (FDA     approved for NASAL specimens     only), is one component of a     comprehensive MRSA colonization     surveillance program. It is not     intended to diagnose MRSA     infection nor to guide or     monitor treatment for     MRSA infections.  AFB CULTURE WITH SMEAR     Status: None    Collection Time    10/29/12 10:27 AM      Result Value Range Status   Specimen Description SPUTUM   Final   Special Requests NONE   Final   ACID FAST SMEAR NO ACID FAST BACILLI SEEN   Final   Culture     Final   Value: CULTURE WILL BE EXAMINED FOR 6 WEEKS BEFORE ISSUING A FINAL REPORT   Report Status PENDING   Incomplete  AFB CULTURE WITH SMEAR     Status: None   Collection Time    10/30/12  1:17 PM      Result Value Range Status   Specimen Description SPUTUM   Final   Special Requests NONE   Final   ACID FAST SMEAR NO ACID FAST BACILLI SEEN   Final   Culture     Final   Value: CULTURE WILL BE EXAMINED FOR 6 WEEKS BEFORE ISSUING A FINAL REPORT   Report Status PENDING   Incomplete  CULTURE, EXPECTORATED SPUTUM-ASSESSMENT     Status: None   Collection Time    10/31/12 11:19 PM      Result Value Range Status   Specimen Description SPUTUM   Final   Special Requests NONE   Final   Sputum evaluation     Final   Value: THIS SPECIMEN IS ACCEPTABLE. RESPIRATORY CULTURE REPORT TO FOLLOW.   Report Status 11/01/2012 FINAL   Final  CULTURE, RESPIRATORY (NON-EXPECTORATED)     Status: None   Collection Time    10/31/12 11:19 PM      Result Value Range Status   Specimen Description SPUTUM   Final   Special Requests NONE   Final   Gram Stain     Final   Value: MODERATE WBC PRESENT, PREDOMINANTLY PMN     FEW SQUAMOUS EPITHELIAL CELLS PRESENT     FEW GRAM POSITIVE COCCI     IN PAIRS FEW GRAM POSITIVE RODS     RARE GRAM NEGATIVE RODS   Culture PENDING   Incomplete   Report Status PENDING   Incomplete    Studies/Results: Dg Chest 2 View  11/01/2012  *RADIOLOGY REPORT*  Clinical Data: Shortness of breath, weakness  CHEST - 2 VIEW  Comparison: 10/31/2012, 10/28/2012  Findings: Normal heart size and  vascularity.  Chronic background emphysema and parenchymal scarring.  Right apical pleural parenchymal density is unchanged, compatible with chronic scarring. No superimposed new airspace process,  collapse, consolidation, edema, or effusion.  Trachea is midline.  No significant interval change.  IMPRESSION: Stable COPD/emphysema with chronic parenchymal scarring, most pronounced in the right apex.  No interval change.   Original Report Authenticated By: Judie Petit. Miles Costain, M.D.    Dg Chest Port 1 View  10/31/2012  *RADIOLOGY REPORT*  Clinical Data: Respiratory distress, increased shortness of breath  PORTABLE CHEST - 1 VIEW  Comparison: 10/28/2012; 05/20/2009; chest CT - 10/28/2012  Findings: Grossly unchanged cardiac silhouette and mediastinal contours.  The lungs remain hyperexpanded with flattening of bilateral hemidiaphragms and mild diffuse thickening of the interstitium.  There is grossly unchanged asymmetric right apical pleuroparenchymal thickening.  Unchanged blunting of the right costophrenic angle without definite pleural effusion.  No pneumothorax.  Unchanged bones.  IMPRESSION: 1.  Grossly stable findings of advanced emphysematous and bronchitic change without acute cardiopulmonary disease. 2.  Grossly unchanged right apical pleural parenchymal thickening and blunting of the right costophrenic angle.   Original Report Authenticated By: Tacey Ruiz, MD      Assessment/Plan: COPD  R apical density, tree in bud, R hilar LAN, ground glass  quantiferon indeterminant  afb stains- #1 negative, #2 negative, #3 pending  Total days of antibiotics: 5 (levaquin) CXR is COPD.  No change in anbx, would aim for 14 days levaquin Back to regular floor? Would stop his isolation with 3rd afb negative (should result today?) I would be happy to see him in ID f/u in 2-3 weeks in clinic thanks    Johny Sax Infectious Diseases [phone] 11/01/2012, 10:01 AM   LOS: 4 days

## 2012-11-01 NOTE — Progress Notes (Signed)
UR complete.  Larin Weissberg RN, MSN 

## 2012-11-01 NOTE — Progress Notes (Signed)
Internal Medicine Teaching Service Attending Note Date: 11/01/2012  Patient name: Steven Wiley  Medical record number: [id number]  Date of birth: 1956-03-14    This patient has been seen and discussed with the house staff. Please see their note for complete details. I concur with their findings with the following additions/corrections: Patient to be transferred to med-surg bed today. Nearing discharge.  Lars Mage 11/01/2012, 4:29 PM

## 2012-11-01 NOTE — Progress Notes (Signed)
Pt to be TX to 5507, VSS, called report. Airborne prec until last titer 3/3 is negative per DR.

## 2012-11-01 NOTE — Progress Notes (Signed)
NURSING PROGRESS NOTE  Ahnaf Caponi [id number] Transfer Data: 11/01/2012 4:54 PM Attending Provider: Lars Mage, MD VHQ:IONGE,[id number], MD Code Status: full   Lynette Noah is a 57 y.o. male patient transferred from 47  -No acute distress noted.  -No complaints of shortness of breath.  -No complaints of chest pain.    Blood pressure 154/75, pulse 66, temperature 98.7 F (37.1 C), temperature source Oral, resp. rate 17, height 5\' 10"  (1.778 m), weight 92 kg (202 lb 13.2 oz), SpO2 91.00%.  Allergies:  Acetaminophen and Penicillins  Past Medical History:   has a past medical history of Neuropathy; Mental disorder; COPD (chronic obstructive pulmonary disease); and Shortness of breath.  Past Surgical History:   has past surgical history that includes Back surgery.  Social History:   reports that he has quit smoking. His smoking use included Cigarettes. He smoked 0.00 packs per day. He quit smokeless tobacco use about 2 weeks ago. He reports that he does not drink alcohol or use illicit drugs.  Skin: intact  Patient/Family orientated to room. Information packet given to patient/family. Admission inpatient armband information verified with patient/family to include name and date of birth and placed on patient arm. Side rails up x 2, fall assessment and education completed with patient/family. Patient/family able to verbalize understanding of risk associated with falls and verbalized understanding to call for assistance before getting out of bed. Call light within reach. Patient/family able to voice and demonstrate understanding of unit orientation instructions.    Will continue to evaluate and treat per MD orders.  Madelin Rear RN, MSN, [employer]

## 2012-11-01 NOTE — Progress Notes (Signed)
PULMONARY  / CRITICAL CARE MEDICINE  Name: Steven Wiley MRN: [id number] DOB: 01/08/1956    ADMISSION DATE:  10/28/2012 CONSULTATION DATE:  10/29/2012  REFERRING MD :  Eben Burow PRIMARY SERVICE:  [facility]  CHIEF COMPLAINT:  SOB and cough  BRIEF PATIENT DESCRIPTION: 57 yo M with hx of COPD presents with increasing SOB and productive cough x 5 days  SIGNIFICANT EVENTS / STUDIES:  5/4 chest CT>>>No evidence of thoracic malignancy. Right apical density appearance most consistent w/ scarring. Extensive chronic lung disease w/ emphysema and additional areas of scarring. Peribronchial nodularity has a tree-in-bud distribution and is likely postinflammatory. Nonspecific prominent mediastinal and right hilar lymph nodes, some partially calcified. This is likely related to prior granulomatous infection. Nonspecific small ground-glass opacity in the right middle lobe.   LINES / TUBES: Peripheral IV  CULTURES: 5/5 AFB culture>>>neg AFB 5/6 AFB culture>>> 5/5 sputum culture>>>nml flora 5/5 Quantiferon gold>>>  ANTIBIOTICS: 5/4 Levaquin>>>   SUBJECTIVE:  Less dyspneic. Cough improved. Still very anxious  VITAL SIGNS: Temp:  [98.3 F (36.8 C)-99 F (37.2 C)] 98.6 F (37 C) (05/08 1703) Pulse Rate:  [66-78] 76 (05/08 1703) Resp:  [13-20] 20 (05/08 1703) BP: (127-157)/(46-93) 157/93 mmHg (05/08 1703) SpO2:  [88 %-97 %] 94 % (05/08 1703) Weight:  [92 kg (202 lb 13.2 oz)] 92 kg (202 lb 13.2 oz) (05/08 0323) 3 liters  PHYSICAL EXAMINATION: General:  Awake and alert in NAD, anxious Neuro:  No focal deficits HEENT:  PERRL. Oral mucosa moist Neck:  supple Cardiovascular:  RRR. No m/g/r Lungs:  No respiratory distress. Coarse expiratory wheeze. Abdomen:  Soft and nontender to palpation Musculoskeletal:  No acute deformities Skin:  Warm and dry   Recent Labs Lab 10/30/12 0900 10/31/12 0551 11/01/12 0410  NA 136 138 138  K 4.0 3.6 4.8  CL 100 101 100  CO2 27 33* 29  BUN 16 18 24*   CREATININE 0.83 1.02 1.01  GLUCOSE 167* 139* 203*    Recent Labs Lab 10/30/12 0900 10/31/12 0551 11/01/12 0410  HGB 12.7* 11.6* 12.6*  HCT 36.7* 34.3* 36.6*  WBC 8.2 9.5 9.4  PLT 134* 140* 178   ABG    Component Value Date/Time   PHART 7.412 10/31/2012 0932   PCO2ART 48.5* 10/31/2012 0932   PO2ART 68.8* 10/31/2012 0932   HCO3 30.3* 10/31/2012 0932   TCO2 31.8 10/31/2012 0932   O2SAT 94.8 10/31/2012 0932   ASSESSMENT / PLAN:  AECOPD RUL scarring vs acute infiltrate - doubt TB  P:  Cont BDs, systemic steroids, supplemental O2, abx, cough suppression Has been intolerant of inhaled steroids due to oral thrush  Would use tiotropium as maintenance inhaler after discharge Will help arrange F/U in Pulmonary clinic after discharge No indication for FOB from PCCM perspective as TB seems unlikely  Will perform FOB if deemed necessary by ID  Oral Thrush - related to oral steroid usage.  Has been on as outpatient.   P: Complete 3 days fluconazole Continue nystatin as long as he is on abx and steroids  Cough with pleurodynia Cont PRN cough suppressants  Anxiety, pain - contributing to overall symptomatology Mgmt per primary team    Billy Fischer, MD ; [facility] service Mobile [phone].  After 5:30 PM or weekends, call [phone]  11/01/2012, 6:26 PM

## 2012-11-01 NOTE — Progress Notes (Signed)
Subjective: Pt doing well this AM. Still having productive cough with changing character sputum now less green and thick to more yellow. Pt feels overall improved and that SOB is slowly resolving. Pt generalized anxiety stating "my condition can kill me." even with distraction pt becomes anxious about another problem.   Objective: Vital signs in last 24 hours: Filed Vitals:   10/31/12 2000 10/31/12 2335 11/01/12 0203 11/01/12 0323  BP: 127/76 135/76 135/76 127/46  Pulse: 72 74 72 78  Temp: 98.9 F (37.2 C) 99 F (37.2 C)  98.3 F (36.8 C)  TempSrc: Oral Oral  Oral  Resp: 17 19 16 13   Height:      Weight:    202 lb 13.2 oz (92 kg)  SpO2: 95% 93% 97% 92%   Weight change: -6 lb 2.8 oz (-2.8 kg)  Intake/Output Summary (Last 24 hours) at 11/01/12 0744 Last data filed at 11/01/12 0500  Gross per 24 hour  Intake    420 ml  Output   3100 ml  Net  -2680 ml   General: resting in bed, anxious HEENT: PERRL, EOMI, bitemporal heminopsia, no scleral icterus Cardiac: RRR, no rubs, murmurs or gallops Pulm: some diffuse wheezes, move better volumes of air this AM compared to yesterday, no accessory muscle use, only nasal cannula Abd: soft, nontender, nondistended, BS present Ext: warm and well perfused, no diaphoresis, no pedal edema Neuro: alert and oriented X3  Lab Results: Basic Metabolic Panel:  Recent Labs Lab 10/30/12 0900 10/31/12 0551  NA 136 138  K 4.0 3.6  CL 100 101  CO2 27 33*  GLUCOSE 167* 139*  BUN 16 18  CREATININE 0.83 1.02  CALCIUM 8.6 8.1*   Liver Function Tests:  Recent Labs Lab 10/28/12 1933  AST 23  ALT 16  ALKPHOS 69  BILITOT 0.2*  PROT 6.0  ALBUMIN 3.0*   CBC:  Recent Labs Lab 10/28/12 1416  10/31/12 0551 11/01/12 0410  WBC 6.6  < > 9.5 9.4  NEUTROABS 4.9  --   --   --   HGB 11.5*  < > 11.6* 12.6*  HCT 34.1*  < > 34.3* 36.6*  MCV 90.9  < > 90.5 89.3  PLT 95*  < > 140* 178  < > = values in this interval not displayed. Thyroid Function  Tests:  Recent Labs Lab 10/28/12 1933  TSH 0.502   Coagulation:  Recent Labs Lab 10/29/12 0435  LABPROT 14.8  INR 1.18   Urine Drug Screen: Drugs of Abuse     Component Value Date/Time   LABOPIA NONE DETECTED 05/21/2009 0004   COCAINSCRNUR NONE DETECTED 05/21/2009 0004   LABBENZ POSITIVE* 05/21/2009 0004   AMPHETMU NONE DETECTED 05/21/2009 0004   THCU NONE DETECTED 05/21/2009 0004   LABBARB  Value: NONE DETECTED        DRUG SCREEN FOR MEDICAL PURPOSES ONLY.  IF CONFIRMATION IS NEEDED FOR ANY PURPOSE, NOTIFY LAB WITHIN 5 DAYS.        LOWEST DETECTABLE LIMITS FOR URINE DRUG SCREEN Drug Class       Cutoff (ng/mL) Amphetamine      1000 Barbiturate      200 Benzodiazepine   200 Tricyclics       300 Opiates          300 Cocaine          300 THC              50 05/21/2009 0004   Micro Results: Recent  Results (from the past 240 hour(s))  CULTURE, EXPECTORATED SPUTUM-ASSESSMENT     Status: None   Collection Time    10/29/12  1:16 AM      Result Value Range Status   Specimen Description SPUTUM   Final   Special Requests Normal   Final   Sputum evaluation     Final   Value: THIS SPECIMEN IS ACCEPTABLE. RESPIRATORY CULTURE REPORT TO FOLLOW.   Report Status 10/29/2012 FINAL   Final  CULTURE, RESPIRATORY (NON-EXPECTORATED)     Status: None   Collection Time    10/29/12  1:16 AM      Result Value Range Status   Specimen Description SPUTUM   Final   Special Requests ADDED 0154   Final   Gram Stain     Final   Value: MODERATE WBC PRESENT,BOTH PMN AND MONONUCLEAR     FEW SQUAMOUS EPITHELIAL CELLS PRESENT     MODERATE GRAM POSITIVE COCCI     IN PAIRS IN CHAINS FEW GRAM NEGATIVE RODS     RARE GRAM NEGATIVE COCCI   Culture NORMAL OROPHARYNGEAL FLORA   Final   Report Status 10/31/2012 FINAL   Final  MRSA PCR SCREENING     Status: None   Collection Time    10/29/12  1:17 AM      Result Value Range Status   MRSA by PCR NEGATIVE  NEGATIVE Final   Comment:            The GeneXpert MRSA  Assay (FDA     approved for NASAL specimens     only), is one component of a     comprehensive MRSA colonization     surveillance program. It is not     intended to diagnose MRSA     infection nor to guide or     monitor treatment for     MRSA infections.  AFB CULTURE WITH SMEAR     Status: None   Collection Time    10/29/12 10:27 AM      Result Value Range Status   Specimen Description SPUTUM   Final   Special Requests NONE   Final   ACID FAST SMEAR NO ACID FAST BACILLI SEEN   Final   Culture     Final   Value: CULTURE WILL BE EXAMINED FOR 6 WEEKS BEFORE ISSUING A FINAL REPORT   Report Status PENDING   Incomplete  AFB CULTURE WITH SMEAR     Status: None   Collection Time    10/30/12  1:17 PM      Result Value Range Status   Specimen Description SPUTUM   Final   Special Requests NONE   Final   ACID FAST SMEAR NO ACID FAST BACILLI SEEN   Final   Culture     Final   Value: CULTURE WILL BE EXAMINED FOR 6 WEEKS BEFORE ISSUING A FINAL REPORT   Report Status PENDING   Incomplete  CULTURE, EXPECTORATED SPUTUM-ASSESSMENT     Status: None   Collection Time    10/31/12 11:19 PM      Result Value Range Status   Specimen Description SPUTUM   Final   Special Requests NONE   Final   Sputum evaluation     Final   Value: THIS SPECIMEN IS ACCEPTABLE. RESPIRATORY CULTURE REPORT TO FOLLOW.   Report Status 11/01/2012 FINAL   Final  CULTURE, RESPIRATORY (NON-EXPECTORATED)     Status: None   Collection Time    10/31/12 11:19  PM      Result Value Range Status   Specimen Description SPUTUM   Final   Special Requests NONE   Final   Gram Stain     Final   Value: MODERATE WBC PRESENT, PREDOMINANTLY PMN     FEW SQUAMOUS EPITHELIAL CELLS PRESENT     FEW GRAM POSITIVE COCCI     IN PAIRS FEW GRAM POSITIVE RODS     RARE GRAM NEGATIVE RODS   Culture PENDING   Incomplete   Report Status PENDING   Incomplete   Studies/Results: Dg Chest Port 1 View  10/31/2012  *RADIOLOGY REPORT*  Clinical Data:  Respiratory distress, increased shortness of breath  PORTABLE CHEST - 1 VIEW  Comparison: 10/28/2012; 05/20/2009; chest CT - 10/28/2012  Findings: Grossly unchanged cardiac silhouette and mediastinal contours.  The lungs remain hyperexpanded with flattening of bilateral hemidiaphragms and mild diffuse thickening of the interstitium.  There is grossly unchanged asymmetric right apical pleuroparenchymal thickening.  Unchanged blunting of the right costophrenic angle without definite pleural effusion.  No pneumothorax.  Unchanged bones.  IMPRESSION: 1.  Grossly stable findings of advanced emphysematous and bronchitic change without acute cardiopulmonary disease. 2.  Grossly unchanged right apical pleural parenchymal thickening and blunting of the right costophrenic angle.   Original Report Authenticated By: Tacey Ruiz, MD    Medications: I have reviewed the patient's current medications. Scheduled Meds: . albuterol  2.5 mg Nebulization Q6H  . chlorpheniramine-HYDROcodone  5 mL Oral Q12H  . enoxaparin (LOVENOX) injection  40 mg Subcutaneous Daily  . fluconazole  100 mg Oral Daily  . FLUoxetine  40 mg Oral Daily  . gabapentin  600 mg Oral TID  . ipratropium  0.5 mg Nebulization Q6H  . levofloxacin  500 mg Oral Q24H  . methylPREDNISolone (SOLU-MEDROL) injection  40 mg Intravenous Q8H  . nystatin  5 mL Mouth/Throat QID  . sodium chloride  3 mL Intravenous Q12H  . traZODone  200 mg Oral QHS   Continuous Infusions:   PRN Meds:.albuterol, chlorpheniramine-HYDROcodone, guaiFENesin-dextromethorphan, LORazepam, morphine injection, ondansetron (ZOFRAN) IV, ondansetron Assessment/Plan: 1.  Acute respiratory distress 2/2 fluid overload- improving: Hypoxic event on 10/31/12 as low as 70s per nursing. ABG calculated PaO2/FiO2 217 indicating mild ARDS/ALI. Less likely this is 2/2 to COPD exacerbation as pt was improving and wouldn't cause such sudden acute decompensation. Pt other vitals stable. PE worsening  decreased BS throughout with still marked wheezes. CXR 5/7 some increased patchy infiltrates from admission this could be due to large volume of IVF resuscitation on admission. Troponin negative. -transfer to Med-surg -ECHO pending -repeat CXR in AM -PCCM made aware -albuterol nebs q6hr -IV solumedrol 40mg  q8 -cont levofloxacin (discussion with Dr. Ninetta Lights would continue course 10-14 days)  2. COPD exacerbation: Pt appears to have very severe disease and needs optimized outpt management. Pt continues to have sputum production but in decreasing in volume. Called PCP office (is currently closed) but that pt was lost to f/u from Feb 2014 and given Tudorza and Breo inhalers given allergies along with short dose prednisone taper at that time.   -IV levofloxacin  -alpha-1 antitrypsin labs pending  -PCCM consulted and recommend PFTs as outpt, f/u appt made with Dr. Kendrick Fries -pneumococcal vaccine evaluated at outpt f/u  3.Rule out acuteTB/ MAI: Pt with hx of possible risky behaviors and incarceration. CXR concerning mass in apical region with calcifications. Some Concern for TB given CT chest findings.Sputum cultures normal flora. 2 AFBs negative and TB quantiferon is negative. HIV  negative. pancultures NTD. Parvovirus pending. Influenza panel was negative.  -AFB sputum will be followed up outpt ID clinic with Dr. Ninetta Lights  -d/c negative pressure room -ID recs much appreciated  4. Adrenal crisis-resolved less likely: Pt known hx of pituitary adenoma and adrenal insufficiency currently not on steroids for several years. Presented to ED hypotensive, abdominal pain and myalgia/arthralgia. Continues to not have electrolyte abnormalities and BP stablizing. Pt had similar presentation about 4 yrs ago with adrenal insufficiency. TSH normal.  -will probably need endocrinology as outpt   5. Pituitary apoplexy- ruled out: pt has known adenoma lost to follow up for 4-40yrs + usually saw Dr. Sharl Ma. No hx of gamma  knife procedure or surgery. Sudden severe HA with associated vision loss. MRI showed no evidence of apoplexy, intracranial mass, or hemorrhage. Heterogenous appearance consistent with previous known adenoma.   Dispo: Disposition is deferred at this time, awaiting improvement of current medical problems.  Anticipated discharge in approximately 3-4 day(s).   The patient does have a current PCP (EASON,ERNEST, MD), therefore will be requiring OPC follow-up after discharge.   The patient does not have transportation limitations that hinder transportation to clinic appointments.  .Services Needed at time of discharge: Y = Yes, Blank = No PT:   OT:   RN:   Equipment:   Other:     LOS: 4 days   Christen Bame, MD PGY-1 Pgr: [phone] 11/01/2012, 7:44 AM

## 2012-11-02 DIAGNOSIS — I517 Cardiomegaly: Secondary | ICD-10-CM

## 2012-11-02 MED ORDER — FLUTICASONE FUROATE-VILANTEROL 100-25 MCG/INH IN AEPB: 100.0000 ug | INHALATION_SPRAY | Freq: Every day | RESPIRATORY_TRACT | Status: DC

## 2012-11-02 MED ORDER — ALBUTEROL SULFATE (5 MG/ML) 0.5% IN NEBU: 2.5000 mg | INHALATION_SOLUTION | Freq: Four times a day (QID) | RESPIRATORY_TRACT | Status: DC | PRN

## 2012-11-02 MED ORDER — HYDROCOD POLST-CHLORPHEN POLST 10-8 MG/5ML PO LQCR: 5.0000 mL | Freq: Two times a day (BID) | ORAL | Status: DC | PRN

## 2012-11-02 MED ORDER — ACLIDINIUM BROMIDE 400 MCG/ACT IN AEPB: 400.0000 ug | INHALATION_SPRAY | Freq: Two times a day (BID) | RESPIRATORY_TRACT | Status: DC

## 2012-11-02 MED ORDER — BUDESONIDE 0.25 MG/2ML IN SUSP: 0.2500 mg | Freq: Four times a day (QID) | RESPIRATORY_TRACT | Status: DC

## 2012-11-02 MED ORDER — PREDNISONE 20 MG PO TABS: ORAL_TABLET | ORAL | Status: DC

## 2012-11-02 MED ORDER — PNEUMOCOCCAL VAC POLYVALENT 25 MCG/0.5ML IJ INJ
0.5000 mL | INJECTION | Freq: Once | INTRAMUSCULAR | Status: AC
  Administered 2012-11-02: 0.5 mL via INTRAMUSCULAR
  Filled 2012-11-02: qty 0.5

## 2012-11-02 MED ORDER — ACLIDINIUM BROMIDE 400 MCG/ACT IN AEPB: 400.0000 ug | INHALATION_SPRAY | Freq: Every day | RESPIRATORY_TRACT | Status: DC

## 2012-11-02 MED ORDER — GUAIFENESIN-DM 100-10 MG/5ML PO SYRP: 5.0000 mL | ORAL_SOLUTION | Freq: Three times a day (TID) | ORAL | Status: DC | PRN

## 2012-11-02 MED ORDER — OXYCODONE HCL 5 MG PO TABS: 5.0000 mg | ORAL_TABLET | Freq: Three times a day (TID) | ORAL | Status: DC | PRN

## 2012-11-02 MED ORDER — PREDNISONE 10 MG PO TABS: ORAL_TABLET | ORAL | Status: DC

## 2012-11-02 MED ORDER — GUAIFENESIN-CODEINE 100-10 MG/5ML PO SOLN: 5.0000 mL | Freq: Two times a day (BID) | ORAL | Status: DC | PRN

## 2012-11-02 MED ORDER — LEVOFLOXACIN 500 MG PO TABS: 500.0000 mg | ORAL_TABLET | ORAL | Status: DC

## 2012-11-02 NOTE — Progress Notes (Signed)
Patient discharge teaching given, including activity, diet, follow-up appoints, and medications. Patient verbalized understanding of all discharge instructions. IV access was d/c'd. Vitals are stable. Skin is intact except as charted in most recent assessments. Pt escorted out by RN, to be driven home by family.  

## 2012-11-02 NOTE — Progress Notes (Addendum)
Subjective: Pt doing well. No complaints. Decreased productive sputum this AM. Off supplemental O2.   Objective: Vital signs in last 24 hours: Filed Vitals:   11/02/12 0204 11/02/12 0451 11/02/12 0509 11/02/12 0908  BP:  113/67    Pulse: 76 60    Temp:  97.8 F (36.6 C)    TempSrc:  Oral    Resp: 20 20    Height:      Weight:   198 lb 13.7 oz (90.2 kg)   SpO2: 96% 99%  98%   Weight change: -3 lb 15.5 oz (-1.8 kg)  Intake/Output Summary (Last 24 hours) at 11/02/12 1112 Last data filed at 11/02/12 1003  Gross per 24 hour  Intake    203 ml  Output      0 ml  Net    203 ml   General: resting in bed, anxious HEENT: PERRL, EOMI, bitemporal heminopsia, no scleral icterus Cardiac: RRR, no rubs, murmurs or gallops Pulm: some slight inspiratory and expiratory wheezes, no accessory muscle use Abd: soft, nontender, nondistended, BS present Ext: warm and well perfused, no diaphoresis, no pedal edema Neuro: alert and oriented X3  Lab Results: Basic Metabolic Panel:  Recent Labs Lab 10/31/12 0551 11/01/12 0410  NA 138 138  K 3.6 4.8  CL 101 100  CO2 33* 29  GLUCOSE 139* 203*  BUN 18 24*  CREATININE 1.02 1.01  CALCIUM 8.1* 8.9   Liver Function Tests:  Recent Labs Lab 10/28/12 1933  AST 23  ALT 16  ALKPHOS 69  BILITOT 0.2*  PROT 6.0  ALBUMIN 3.0*   CBC:  Recent Labs Lab 10/28/12 1416  10/31/12 0551 11/01/12 0410  WBC 6.6  < > 9.5 9.4  NEUTROABS 4.9  --   --   --   HGB 11.5*  < > 11.6* 12.6*  HCT 34.1*  < > 34.3* 36.6*  MCV 90.9  < > 90.5 89.3  PLT 95*  < > 140* 178  < > = values in this interval not displayed. Thyroid Function Tests:  Recent Labs Lab 10/28/12 1933  TSH 0.502   Coagulation:  Recent Labs Lab 10/29/12 0435  LABPROT 14.8  INR 1.18   Urine Drug Screen: Drugs of Abuse     Component Value Date/Time   LABOPIA NONE DETECTED 05/21/2009 0004   COCAINSCRNUR NONE DETECTED 05/21/2009 0004   LABBENZ POSITIVE* 05/21/2009 0004    AMPHETMU NONE DETECTED 05/21/2009 0004   THCU NONE DETECTED 05/21/2009 0004   LABBARB  Value: NONE DETECTED        DRUG SCREEN FOR MEDICAL PURPOSES ONLY.  IF CONFIRMATION IS NEEDED FOR ANY PURPOSE, NOTIFY LAB WITHIN 5 DAYS.        LOWEST DETECTABLE LIMITS FOR URINE DRUG SCREEN Drug Class       Cutoff (ng/mL) Amphetamine      1000 Barbiturate      200 Benzodiazepine   200 Tricyclics       300 Opiates          300 Cocaine          300 THC              50 05/21/2009 0004   Micro Results: Recent Results (from the past 240 hour(s))  CULTURE, EXPECTORATED SPUTUM-ASSESSMENT     Status: None   Collection Time    10/29/12  1:16 AM      Result Value Range Status   Specimen Description SPUTUM   Final   Special  Requests Normal   Final   Sputum evaluation     Final   Value: THIS SPECIMEN IS ACCEPTABLE. RESPIRATORY CULTURE REPORT TO FOLLOW.   Report Status 10/29/2012 FINAL   Final  CULTURE, RESPIRATORY (NON-EXPECTORATED)     Status: None   Collection Time    10/29/12  1:16 AM      Result Value Range Status   Specimen Description SPUTUM   Final   Special Requests ADDED 0154   Final   Gram Stain     Final   Value: MODERATE WBC PRESENT,BOTH PMN AND MONONUCLEAR     FEW SQUAMOUS EPITHELIAL CELLS PRESENT     MODERATE GRAM POSITIVE COCCI     IN PAIRS IN CHAINS FEW GRAM NEGATIVE RODS     RARE GRAM NEGATIVE COCCI   Culture NORMAL OROPHARYNGEAL FLORA   Final   Report Status 10/31/2012 FINAL   Final  MRSA PCR SCREENING     Status: None   Collection Time    10/29/12  1:17 AM      Result Value Range Status   MRSA by PCR NEGATIVE  NEGATIVE Final   Comment:            The GeneXpert MRSA Assay (FDA     approved for NASAL specimens     only), is one component of a     comprehensive MRSA colonization     surveillance program. It is not     intended to diagnose MRSA     infection nor to guide or     monitor treatment for     MRSA infections.  AFB CULTURE WITH SMEAR     Status: None   Collection Time     10/29/12 10:27 AM      Result Value Range Status   Specimen Description SPUTUM   Final   Special Requests NONE   Final   ACID FAST SMEAR NO ACID FAST BACILLI SEEN   Final   Culture     Final   Value: CULTURE WILL BE EXAMINED FOR 6 WEEKS BEFORE ISSUING A FINAL REPORT   Report Status PENDING   Incomplete  AFB CULTURE WITH SMEAR     Status: None   Collection Time    10/30/12  1:17 PM      Result Value Range Status   Specimen Description SPUTUM   Final   Special Requests NONE   Final   ACID FAST SMEAR NO ACID FAST BACILLI SEEN   Final   Culture     Final   Value: CULTURE WILL BE EXAMINED FOR 6 WEEKS BEFORE ISSUING A FINAL REPORT   Report Status PENDING   Incomplete  CULTURE, EXPECTORATED SPUTUM-ASSESSMENT     Status: None   Collection Time    10/31/12 11:19 PM      Result Value Range Status   Specimen Description SPUTUM   Final   Special Requests NONE   Final   Sputum evaluation     Final   Value: THIS SPECIMEN IS ACCEPTABLE. RESPIRATORY CULTURE REPORT TO FOLLOW.   Report Status 11/01/2012 FINAL   Final  AFB CULTURE WITH SMEAR     Status: None   Collection Time    10/31/12 11:19 PM      Result Value Range Status   Specimen Description SPUTUM   Final   Special Requests NONE   Final   ACID FAST SMEAR NO ACID FAST BACILLI SEEN   Final   Culture  Final   Value: CULTURE WILL BE EXAMINED FOR 6 WEEKS BEFORE ISSUING A FINAL REPORT   Report Status PENDING   Incomplete  CULTURE, RESPIRATORY (NON-EXPECTORATED)     Status: None   Collection Time    10/31/12 11:19 PM      Result Value Range Status   Specimen Description SPUTUM   Final   Special Requests NONE   Final   Gram Stain     Final   Value: MODERATE WBC PRESENT, PREDOMINANTLY PMN     FEW SQUAMOUS EPITHELIAL CELLS PRESENT     FEW GRAM POSITIVE COCCI     IN PAIRS FEW GRAM POSITIVE RODS     RARE GRAM NEGATIVE RODS   Culture Culture reincubated for better growth   Final   Report Status PENDING   Incomplete    Studies/Results: Dg Chest 2 View  11/01/2012  *RADIOLOGY REPORT*  Clinical Data: Shortness of breath, weakness  CHEST - 2 VIEW  Comparison: 10/31/2012, 10/28/2012  Findings: Normal heart size and vascularity.  Chronic background emphysema and parenchymal scarring.  Right apical pleural parenchymal density is unchanged, compatible with chronic scarring. No superimposed new airspace process, collapse, consolidation, edema, or effusion.  Trachea is midline.  No significant interval change.  IMPRESSION: Stable COPD/emphysema with chronic parenchymal scarring, most pronounced in the right apex.  No interval change.   Original Report Authenticated By: Judie Petit. Miles Costain, M.D.    Medications: I have reviewed the patient's current medications. Scheduled Meds: . albuterol  2.5 mg Nebulization Q6H  . budesonide  0.25 mg Nebulization Q6H  . chlorpheniramine-HYDROcodone  5 mL Oral Q12H  . enoxaparin (LOVENOX) injection  40 mg Subcutaneous Daily  . FLUoxetine  40 mg Oral Daily  . gabapentin  600 mg Oral TID  . ipratropium  0.5 mg Nebulization Q6H  . levofloxacin  500 mg Oral Q24H  . methylPREDNISolone (SOLU-MEDROL) injection  40 mg Intravenous Q12H  . nystatin  5 mL Mouth/Throat QID  . sodium chloride  3 mL Intravenous Q12H  . traZODone  200 mg Oral QHS   Continuous Infusions:   PRN Meds:.chlorpheniramine-HYDROcodone, guaiFENesin-dextromethorphan, LORazepam, morphine injection, ondansetron (ZOFRAN) IV, ondansetron Assessment/Plan: 1.  Acute respiratory distress 2/2 fluid overload- resolved: Hypoxic event on 10/31/12 as low as 70s per nursing. ABG calculated PaO2/FiO2 217 indicating mild ARDS/ALI. Less likely this is 2/2 to COPD exacerbation as pt was improving and wouldn't cause such sudden acute decompensation. Pt other vitals stable. PE worsening decreased BS throughout with still marked wheezes. CXR 5/7 some increased patchy infiltrates from admission this could be due to large volume of IVF resuscitation on  admission. Troponin negative. -ECHO pending  2. COPD exacerbation: Pt appears to have very severe disease and needs optimized outpt management. Pt continues to have sputum production but in decreasing in volume. Called PCP office (is currently closed) but that pt was lost to f/u from Feb 2014 and given Tudorza and Breo inhalers given allergies along with short dose prednisone taper at that time.  -IV levofloxacin transition to PO for prolonged course -transition to oral prednisone for prolonged taper  (discussion with Dr. Ninetta Lights would continue course 10-14 days) -will have pt go home with nebulizer treatments of albuterol and combivent q6 will discuss with case management for neb machine -pt allergies were updated in terms of intolerance to spiriva and advair as verified by PCP office -presciption for [employer] and Breo inhalers that pt was on outpt was sent to pharmacy -alpha-1 antitrypsin labs pending  -PCCM consulted  and recommend PFTs as outpt, f/u appt made with Dr. Kendrick Fries -pneumococcal vaccine evaluated at outpt f/u  3.Rule out acuteTB/ MAI: Pt with hx of possible risky behaviors and incarceration. CXR concerning mass in apical region with calcifications. Some Concern for TB given CT chest findings.Sputum cultures normal flora. 2 AFBs negative and TB quantiferon is negative. HIV negative. pancultures NTD. Parvovirus pending. Influenza panel was negative.  -AFB sputum will be followed up outpt ID clinic with Dr. Ninetta Lights  -ID recs much appreciated  4. Adrenal crisis-resolved less likely: Pt known hx of pituitary adenoma and adrenal insufficiency currently not on steroids for several years. Presented to ED hypotensive, abdominal pain and myalgia/arthralgia. Continues to not have electrolyte abnormalities and BP stablizing. Pt had similar presentation about 4 yrs ago with adrenal insufficiency. TSH normal.  -will probably need endocrinology as outpt   5. Pituitary apoplexy- ruled out: pt has  known adenoma lost to follow up for 4-54yrs + usually saw Dr. Sharl Ma. No hx of gamma knife procedure or surgery. Sudden severe HA with associated vision loss. MRI showed no evidence of apoplexy, intracranial mass, or hemorrhage. Heterogenous appearance consistent with previous known adenoma.   Dispo: Disposition is deferred at this time, awaiting improvement of current medical problems.  Anticipated discharge in approximately 3-4 day(s).   The patient does have a current PCP (EASON,ERNEST, MD), therefore will be requiring OPC follow-up after discharge.   The patient does not have transportation limitations that hinder transportation to clinic appointments.  .Services Needed at time of discharge: Y = Yes, Blank = No PT:   OT:   RN:   Equipment:   Other:     LOS: 5 days   Christen Bame, MD PGY-1 Pgr: [phone] 11/02/2012, 11:12 AM

## 2012-11-02 NOTE — Progress Notes (Signed)
Internal Medicine Teaching Service Attending Note Date: 11/02/2012  Patient name: Steven Wiley  Medical record number: [id number]  Date of birth: 16-Apr-1956    This patient has been seen and discussed with the house staff. Please see their note for complete details. I concur with their findings with the following additions/corrections: Patient denied any complaints to me this morning. It seems that patient is ready for discharge at this time with outpatient followup with his primary care physician in [place] and pulmonary clinic. Patient will be sent home on antibiotics and slow steroid taper.   Lars Mage 11/02/2012, 12:13 PM

## 2012-11-02 NOTE — Progress Notes (Signed)
  Echocardiogram 2D Echocardiogram has been performed.  Steven Wiley 11/02/2012, 12:18 PM

## 2012-11-02 NOTE — Progress Notes (Signed)
PULMONARY  / CRITICAL CARE MEDICINE  Name: Steven Wiley MRN: [id number] DOB: 08/18/1955    ADMISSION DATE:  10/28/2012 CONSULTATION DATE:  10/29/2012  REFERRING MD :  Eben Burow PRIMARY SERVICE:  [facility]  CHIEF COMPLAINT:  SOB and cough  BRIEF PATIENT DESCRIPTION: 57 yo M with hx of COPD presents with increasing SOB and productive cough x 5 days  SIGNIFICANT EVENTS / STUDIES:  5/4 chest CT>>>No evidence of thoracic malignancy. Right apical density appearance most consistent w/ scarring. Extensive chronic lung disease w/ emphysema and additional areas of scarring. Peribronchial nodularity has a tree-in-bud distribution and is likely postinflammatory. Nonspecific prominent mediastinal and right hilar lymph nodes, some partially calcified. This is likely related to prior granulomatous infection. Nonspecific small ground-glass opacity in the right middle lobe.   LINES / TUBES: Peripheral IV  CULTURES: 5/5 - 7 AFB smear >>>neg X 3 5/6 AFB culture>>> 5/5 sputum culture>>>nml flora 5/5 Quantiferon gold>>> indeterminant  ANTIBIOTICS: 5/4 Levaquin>>>   SUBJECTIVE:  Less dyspneic. Cough improved. Still very anxious  VITAL SIGNS: Temp:  [97.8 F (36.6 C)-98.6 F (37 C)] 97.8 F (36.6 C) (05/09 0451) Pulse Rate:  [60-76] 60 (05/09 0451) Resp:  [20] 20 (05/09 0451) BP: (113-157)/(67-93) 113/67 mmHg (05/09 0451) SpO2:  [91 %-99 %] 98 % (05/09 0908) Weight:  [90.2 kg (198 lb 13.7 oz)] 90.2 kg (198 lb 13.7 oz) (05/09 0509)   PHYSICAL EXAMINATION: General:  Awake and alert in NAD, anxious Neuro:  No focal deficits HEENT:  PERRL. Oral mucosa moist Neck:  supple Cardiovascular:  RRR. No m/g/r Lungs: Coarse expiratory wheeze improved Abdomen:  Soft and nontender to palpation Musculoskeletal:  No edema Skin:  Warm and dry   Recent Labs Lab 10/30/12 0900 10/31/12 0551 11/01/12 0410  NA 136 138 138  K 4.0 3.6 4.8  CL 100 101 100  CO2 27 33* 29  BUN 16 18 24*  CREATININE 0.83 1.02 1.01   GLUCOSE 167* 139* 203*    Recent Labs Lab 10/30/12 0900 10/31/12 0551 11/01/12 0410  HGB 12.7* 11.6* 12.6*  HCT 36.7* 34.3* 36.6*  WBC 8.2 9.5 9.4  PLT 134* 140* 178   ABG    Component Value Date/Time   PHART 7.412 10/31/2012 0932   PCO2ART 48.5* 10/31/2012 0932   PO2ART 68.8* 10/31/2012 0932   HCO3 30.3* 10/31/2012 0932   TCO2 31.8 10/31/2012 0932   O2SAT 94.8 10/31/2012 0932   ASSESSMENT / PLAN:  AECOPD RUL scarring vs acute infiltrate - doubt TB.  May D/C resp isolation as AFB sma\ear - X 3  Needs CXR q 3 months X one yr to ensure stability   If progressive, further Pulm eval  Oral Thrush - related to oral steroid usage.  Has been on as outpatient.   Complete 3 days fluconazole  Continue nystatin as long as he is on abx and steroids  Cough with pleurodynia  Cont PRN cough suppressants  Anxiety, pain - contributing to overall symptomatology  Mgmt per primary team  He is nearing discharge. PCCM will sign off Would discharge on ... 1) Spiriva daily 2) PRN albuterol 3) Levofloxacin to complete 7-10 days 4) prednisone taper 60 mg to off over 10 days or so  He should call [phone] to arrange Pulmonary follow up in 2 wks or so with CXR If primary team schedules follow up, can call [phone]   PCCM will sign off. Please call if we can be of further assistance   Billy Fischer, MD ; PCCM service  Mobile [phone].  After 5:30 PM or weekends, call [phone]  11/02/2012, 12:20 PM

## 2012-11-02 NOTE — Progress Notes (Signed)
SATURATION QUALIFICATIONS: (This note is used to comply with regulatory documentation for home oxygen)  Patient Saturations on Room Air at Rest = 91%  Patient Saturations on Room Air while Ambulating =87%  Patient Saturations on 2 Liters of oxygen while Ambulating = 92%  Please briefly explain why patient needs home oxygen: 

## 2012-11-02 NOTE — Discharge Summary (Signed)
Internal Medicine Teaching [facility] Discharge Note  Name: Steven Wiley MRN: [id number] DOB: 1955/12/01 57 y.o.  Date of Admission: 10/28/2012  1:56 PM Date of Discharge: 11/02/12 Attending Physician: Dr. Lars Mage  Discharge Diagnosis: Principal Problem:   COPD exacerbation Active Problems:   Adrenal insufficiency   Acute adrenal crisis   Pituitary adenoma   Anxiety disorder   Acute-on-chronic respiratory failure   Odynophagia   Oral thrush  Discharge Medications:   Medication List    TAKE these medications       Aclidinium Bromide 400 MCG/ACT Aepb  Commonly known as:  TUDORZA PRESSAIR  Inhale 400 mcg into the lungs 2 (two) times daily.     albuterol (5 MG/ML) 0.5% nebulizer solution  Commonly known as:  PROVENTIL  Take 0.5 mLs (2.5 mg total) by nebulization every 6 (six) hours as needed for wheezing.     budesonide 0.25 MG/2ML nebulizer solution  Commonly known as:  PULMICORT  Take 2 mLs (0.25 mg total) by nebulization every 6 (six) hours.     FLUoxetine 20 MG capsule  Commonly known as:  PROZAC  Take 40 mg by mouth daily.     Fluticasone Furoate-Vilanterol 100-25 MCG/INH Aepb  Commonly known as:  BREO ELLIPTA  Inhale 100 mcg into the lungs daily.     gabapentin 600 MG tablet  Commonly known as:  NEURONTIN  Take 600 mg by mouth 3 (three) times daily.     guaiFENesin-codeine 100-10 MG/5ML syrup  Take 5 mLs by mouth 2 (two) times daily as needed for cough.     levofloxacin 500 MG tablet  Commonly known as:  LEVAQUIN  Take 1 tablet (500 mg total) by mouth daily.     oxyCODONE 5 MG immediate release tablet  Commonly known as:  Oxy IR/ROXICODONE  Take 1 tablet (5 mg total) by mouth every 8 (eight) hours as needed for pain.     predniSONE 20 MG tablet  Commonly known as:  DELTASONE  3 Tabs PO Days 1-3, then 2 tabs PO Days 4-6, then 1 tab PO Day 7-9, then Half Tab PO Day 10-12     traZODone 100 MG tablet  Commonly known as:  DESYREL  Take 200 mg by  mouth at bedtime.        Disposition and follow-up:   Steven Wiley was discharged from [facility] in Stable condition.  At the hospital follow up visit please address compliance with inhalers for COPD, alpha-1 antitrypsin labs, and AFB cultures.   Follow-up Appointments: Follow-up Information   Follow up with Max Fickle, MD On 11/13/2012. (11am )    Contact information:   [address] [place] [address] [phone]       Follow up with EASON,ERNEST, MD In 1 week.   Contact information:   [address] [place] [address] [phone]       Follow up with Johny Sax, MD In 3 weeks.   Contact information:   [address]  [address] [place] [address] [phone]      Discharge Orders   Future Appointments Provider Department Dept Phone   11/13/2012 11:00 AM Lupita Leash, MD [facility] PULMONARY [place]  [phone]   11/22/2012 2:00 PM Randall Hiss, MD [facility] [phone]   Future Orders Complete By Expires     Increase activity slowly  As directed  Consultations:   Pulmonary, Infectious Disease  Procedures Performed:  Dg Chest 2 View  11/01/2012  *RADIOLOGY REPORT*  Clinical Data: Shortness of breath, weakness  CHEST - 2 VIEW  Comparison: 10/31/2012, 10/28/2012  Findings: Normal heart size and vascularity.  Chronic background emphysema and parenchymal scarring.  Right apical pleural parenchymal density is unchanged, compatible with chronic scarring. No superimposed new airspace process, collapse, consolidation, edema, or effusion.  Trachea is midline.  No significant interval change.  IMPRESSION: Stable COPD/emphysema with chronic parenchymal scarring, most pronounced in the right apex.  No interval change.   Original Report Authenticated By: Judie Petit. Miles Costain, M.D.    Dg Chest 2 View  10/28/2012  **ADDENDUM** CREATED:  10/28/2012 15:26:32  Findings discussed with Dr. Lynelle Doctor on 10/28/2012 at 3:29 p.m.  **END ADDENDUM** SIGNED BY: Richarda Overlie, M.D.   10/28/2012  *RADIOLOGY REPORT*  Clinical Data: Cough and shortness of breath.  CHEST - 2 VIEW  Comparison: 05/20/2009  Findings: Two views of the chest demonstrate mild hyperinflation. There are densities in the right lung apex which could be related to scarring.  However, these densities  have progressed and there is concern for a 1.3 cm nodular structure in this area. Interstitial markings are slightly prominent and may represent chronic changes.  Heart size is stable.  IMPRESSION: Right apical densities have progressed since the prior examination.  There may also be a nodular lesion which measures up to 1.3 cm.  Recommend further characterization with a chest CT.  Mild haziness in the lungs that could represent chronic changes.  Original Report Authenticated By: Richarda Overlie, M.D.    Ct Chest W Contrast  10/28/2012  *RADIOLOGY REPORT*  Clinical Data: Shortness of breath with cough, fever and chills. Possible right apical nodularity on radiographs.  CT CHEST WITH CONTRAST  Technique:  Multidetector CT imaging of the chest was performed following the standard protocol during bolus administration of intravenous contrast.  Contrast: 80mL OMNIPAQUE IOHEXOL 300 MG/ML  SOLN  Comparison: Prior chest radiographs 05/20/2009 and 10/28/2012. Cervical spine CT 02/13/2009.  Findings: There are partially calcified right hilar and subcarinal lymph nodes.  Noncalcified component of a subcarinal node measures 1.3 cm short axis on image 28.  There are mildly prominent non calcified precarinal nodes measuring up to 12 mm short axis.  There is no axillary adenopathy.  Contrast bolus is suboptimal.  There is central enlargement the pulmonary arteries consistent with pulmonary arterial hypertension. There is no evidence for pulmonary embolism on this non dedicated study.  There is no significant  atherosclerosis.  A small amount of pleural thickening or pleural fluid is present on the right.  There is no left pleural effusion or pericardial effusion.  There is extensive chronic lung disease with emphysema and paraseptal bleb formation.  The asymmetric density at the right apex noted on radiographs appears to most likely represent apical scarring, progressed from prior radiographs, but without focal mass or adjacent chest wall abnormality. There are scattered nodular densities with a tree-in-bud distribution, especially in the upper lobes.  There is a linear scarring atelectasis in the right lower lobe.  There is a 7 mm focal ground-glass opacity in the right middle lobe on image 29.  No dominant mass or endobronchial lesion is seen.  The visualized upper abdomen appears unremarkable.  There are no worrisome osseous findings.  IMPRESSION:  1.  No specific evidence of thoracic malignancy.  The asymmetric right apical density has an appearance most consistent with scarring. 2.  Extensive chronic  lung disease with emphysema and additional areas of scarring.  Peribronchial nodularity has a tree-in-bud distribution and it is likely postinflammatory.  This can be seen with typical and atypical mycobacterial infection. 3.  Nonspecific prominent mediastinal and right hilar lymph nodes, some partially calcified.  This is likely related to prior granulomatous infection. 4.  Nonspecific small ground-glass opacity in the right middle lobe. Consider follow-up by chest CT without contrast in 3-6 months to confirm persistence.   This recommendation follows the consensus statement: Recommendations for the Management of Subsolid Pulmonary Nodules Detected at CT:  A Statement from the Fleischner Society as published in Radiology 2013; 266:304-317. 5.  Sternal nonunion.   Original Report Authenticated By: Carey Bullocks, M.D.    Steven Wiley Contrast  10/28/2012  *RADIOLOGY REPORT*  Clinical Data: Pituitary abnormality.   Headache.  Question pituitary apoplexy?  MRI HEAD WITHOUT AND WITH CONTRAST  Technique:  Multiplanar, multiecho pulse sequences of the brain and surrounding structures were obtained according to standard protocol without and with intravenous contrast  Contrast: 20mL MULTIHANCE GADOBENATE DIMEGLUMINE 529 MG/ML IV SOLN  Comparison: 09/24/2010.  Findings: The patient had a cough and images are motion degraded.  No acute infarct.  No evidence of pituitary apoplexy.  Heterogeneous appearance of the pituitary gland.  Difficult to confirm or exclude a tiny microadenoma.  Scattered nonspecific white matter type changes similar to the prior exam.  This may be related to result of small vessel disease.  No Steven evidence of intracranial hemorrhage.  No intracranial mass or abnormal enhancement.  Mild atrophy without hydrocephalus.  Major intracranial vascular structures are patent.  Paranasal sinus mucosal thickening.  Partial opacification ethmoid sinus air cells.  Cervical medullary junction, pineal region and orbital structures unremarkable.  IMPRESSION: The patient had a cough and images are motion degraded.  No acute infarct.  No evidence of pituitary apoplexy.  Heterogeneous appearance of the pituitary gland.  Difficult to confirm or exclude a tiny microadenoma.  Scattered nonspecific white matter type changes similar to the prior exam.  This may be related to result of small vessel disease.  No Steven evidence of intracranial hemorrhage.  No intracranial mass or abnormal enhancement.  Paranasal sinus mucosal thickening.  Partial opacification ethmoid sinus air cells.   Original Report Authenticated By: Lacy Duverney, M.D.    Dg Chest Port 1 View  10/31/2012  *RADIOLOGY REPORT*  Clinical Data: Respiratory distress, increased shortness of breath  PORTABLE CHEST - 1 VIEW  Comparison: 10/28/2012; 05/20/2009; chest CT - 10/28/2012  Findings: Grossly unchanged cardiac silhouette and mediastinal contours.  The lungs remain  hyperexpanded with flattening of bilateral hemidiaphragms and mild diffuse thickening of the interstitium.  There is grossly unchanged asymmetric right apical pleuroparenchymal thickening.  Unchanged blunting of the right costophrenic angle without definite pleural effusion.  No pneumothorax.  Unchanged bones.  IMPRESSION: 1.  Grossly stable findings of advanced emphysematous and bronchitic change without acute cardiopulmonary disease. 2.  Grossly unchanged right apical pleural parenchymal thickening and blunting of the right costophrenic angle.   Original Report Authenticated By: Tacey Ruiz, MD     2D Echo: Left ventricle: The cavity size was mildly dilated. Wall thickness was normal. The estimated ejection fraction was 55%. Images were inadequate for LV wall motion assessment. - Right ventricle: Not able to assess RV function due to poor visualization. Not able to assess RV size due to poor visualization. However, limited data suggests that RV is not significantly dilated and RV function may  be OK.  Admission HPI: Steven Wiley 57 yo man with pmh of adrenal insufficiency not currently on steroids, COPD, pituitary microadenoma, and bipolar disorder. 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 Pt is currently unemployed and was a heavy Etoh user and smoker but has had sobriety for 20+ yrs and 2 month break from smoking. Pt then had some sick contact including a landlord with URI symptoms that visited and coughed on the patient. Pt also doesn't wipe down exercise equipment at public gym. Pt has had severe myalgias and arthralgia, measured temperature 103F at home, sudden severe "worse HA of my life" associated with peripheral vision loss, chronic cough, night sweats, and SOB. Pt lives with girl friend. Pt has had some decreased UO and BM. No skin rashes or hyper/hypopignmented spots on skin. No fainting, LOC, or CP. Pt has been  hospitalized for work related traumatic injury included femur break replaced with rod and back surgery. No significant family hx. Father died of colon cancer, mother of dementia, and brothers and sister healthy. No hx of AI disease, DM, other cancers, or pituitary adenomas. Pt was incarcerated many years ago 10+ for DWI w/o extended stay and has never been homeless, but pt has hx of bipolar disorder with risky behaviors and behavioral health admissions for treatment. Pt only meds at home are prn nebs and oxycodone for back pain   Hospital Course by problem list: 1. Acute respiratory distress 2/2 fluid overload- resolved: During admission bpt had hypoxic event on 10/31/12 as low as 70s per nursing. ABG calculated PaO2/FiO2 217 indicating mild ARDS/ALI. Less likely this is 2/2 to COPD exacerbation as pt was improving and wouldn't have caused such sudden acute decompensation.CXR 5/7 some increased patchy infiltrates from admission this could be due to large volume of IVF resuscitation on admission. Troponins negative and ECHO was unremarkable. This resolved by patient's d/c.   2. COPD exacerbation: Pt appears to have very severe disease and needs optimized outpt management. Pt had significant sputum production throughout admission. Called PCP office (is currently closed) but that pt was lost to f/u from Feb 2014 and given Tudorza and Breo inhalers given allergies to Spiriva and Advair. Pt was treated with IV levofloxacin and transitioned to PO for a prolonged course of 14 days per Dr. Ninetta Lights of infectious disease. A similar 10 day 60mg  prednisone taper was given. Pt appears to have financial difficulties and therefore non-compliance at times with inhalers and has never had neb treatments at home. Pt was given nebulizer treatments of albuterol and combivent q6 along with neb machine Given pt's significant disease Pulmonology was consulted and   recommend PFTs as outpt, f/u appt made with Dr. Kendrick Fries along with  alpha-1 antitrypsin labs that were pending at d/c.   3.Rule out acuteTB/ MAI: Pt with hx of possible risky behaviors and incarceration. CXR concerning mass in apical region with calcifications in ED at pt presentation. Therefore f/u CT was done where nodule most likely scarring, but extensive lung disease and Peribronchial nodularity in a tree-in-bud distribution and it is likely postinflammatory with some concern for TB.This was discussed with infectious disease and pt was put in negative pressure room and 3 sputum cultures and AFB smears were collected. Sputum cultures normal flora, 2 AFBs negative,TB quantiferon negative, HIV negative, pancultures NTD, Parvovirus negative, and Influenza panel was negative upon d/c.AFB sputum will be followed up outpt ID clinic with Dr. Ninetta Lights  4. Adrenal crisis-resolved less likely: Pt known hx of pituitary adenoma and adrenal insufficiency currently not on steroids for several years. Presented to ED hypotensive, abdominal pain and myalgia/arthralgia. Continues to not have electrolyte abnormalities and BP stablizing. Pt had similar presentation about 4 yrs ago with adrenal insufficiency. TSH normal. will probably need endocrinology as outpt follow up.  5. Pituitary apoplexy- ruled out: pt has known adenoma lost to follow up for 4-28yrs + usually saw Dr. Sharl Ma. No hx of gamma knife procedure or surgery. Sudden severe HA with associated vision loss. MRI showed no evidence of apoplexy, intracranial mass, or hemorrhage. Heterogenous appearance consistent with previous known adenoma.    Discharge Vitals:  BP 113/74  Pulse 87  Temp(Src) 98.8 F (37.1 C) (Oral)  Resp 20  Ht 5\' 10"  (1.778 m)  Wt 198 lb 13.7 oz (90.2 kg)  BMI 28.53 kg/m2  SpO2 92% General: resting in bed, anxious  HEENT: PERRL, EOMI, bitemporal heminopsia, no scleral icterus  Cardiac: RRR, no rubs, murmurs or gallops  Pulm: some slight inspiratory and expiratory wheezes, no accessory muscle use  Abd:  soft, nontender, nondistended, BS present  Ext: warm and well perfused, no diaphoresis, no pedal edema  Neuro: alert and oriented X3  Discharge Labs: No results found for this or any previous visit (from the past 24 hour(s)).  Signed: Christen Bame, MD  11/05/2012, 7:41 AM   Time Spent on Discharge: 55 min Services Ordered on Discharge: none Equipment Ordered on Discharge: none

## 2012-11-03 ENCOUNTER — Telehealth: Payer: Self-pay | Admitting: Internal Medicine

## 2012-11-03 LAB — CULTURE, RESPIRATORY W GRAM STAIN: Culture: NORMAL

## 2012-11-03 NOTE — Telephone Encounter (Signed)
INTERNAL MEDICINE RESIDENCY PROGRAM After-Hours Telephone Call    Reason for call:   I received a call from [name] on 11/03/2012 at 11:22 AM. Patient stated that he did not like Robitussin-codeine PRN that was prescribed upon discharge yesterday since it causes nausea. He would like to have Tussionex that was given during this hospitalization.     I explained to patient that his Insurance would not pay for Tussionex which was the reason that he was discharged on Robitussin-codeine PRN. Patient states that he will pay it himself.      Assessment / Plan / Recommendations:   I called in the Rx to his pharmacy. Tussionex 5 ml po Q 12 hours as needed, 4 Oz bottle with no refill.   As always, pt is advised that if symptoms worsen or new symptoms arise, they should go to an urgent care facility or to to ER for further evaluation.    Dede Query, MD   11/03/2012, 11:22 AM

## 2012-11-13 ENCOUNTER — Encounter: Payer: Self-pay | Admitting: Pulmonary Disease

## 2012-11-13 ENCOUNTER — Ambulatory Visit (INDEPENDENT_AMBULATORY_CARE_PROVIDER_SITE_OTHER): Payer: Medicaid Other | Admitting: Pulmonary Disease

## 2012-11-13 ENCOUNTER — Telehealth: Payer: Self-pay | Admitting: Pulmonary Disease

## 2012-11-13 VITALS — BP 110/60 | HR 70 | Temp 98.4°F | Ht 69.0 in | Wt 185.0 lb

## 2012-11-13 DIAGNOSIS — F1910 Other psychoactive substance abuse, uncomplicated: Secondary | ICD-10-CM

## 2012-11-13 DIAGNOSIS — Z765 Malingerer [conscious simulation]: Secondary | ICD-10-CM | POA: Insufficient documentation

## 2012-11-13 DIAGNOSIS — R0989 Other specified symptoms and signs involving the circulatory and respiratory systems: Secondary | ICD-10-CM

## 2012-11-13 DIAGNOSIS — J441 Chronic obstructive pulmonary disease with (acute) exacerbation: Secondary | ICD-10-CM

## 2012-11-13 DIAGNOSIS — J9620 Acute and chronic respiratory failure, unspecified whether with hypoxia or hypercapnia: Secondary | ICD-10-CM

## 2012-11-13 DIAGNOSIS — R9389 Abnormal findings on diagnostic imaging of other specified body structures: Secondary | ICD-10-CM

## 2012-11-13 DIAGNOSIS — R0600 Dyspnea, unspecified: Secondary | ICD-10-CM

## 2012-11-13 DIAGNOSIS — R918 Other nonspecific abnormal finding of lung field: Secondary | ICD-10-CM

## 2012-11-13 DIAGNOSIS — J449 Chronic obstructive pulmonary disease, unspecified: Secondary | ICD-10-CM

## 2012-11-13 DIAGNOSIS — J690 Pneumonitis due to inhalation of food and vomit: Secondary | ICD-10-CM

## 2012-11-13 MED ORDER — DOXYCYCLINE HYCLATE 50 MG PO CAPS: 100.0000 mg | ORAL_CAPSULE | Freq: Two times a day (BID) | ORAL | Status: DC

## 2012-11-13 MED ORDER — DOXYCYCLINE HYCLATE 100 MG PO TABS: 100.0000 mg | ORAL_TABLET | Freq: Two times a day (BID) | ORAL | Status: DC

## 2012-11-13 MED ORDER — PREDNISONE 10 MG PO TABS: ORAL_TABLET | ORAL | Status: DC

## 2012-11-13 NOTE — Telephone Encounter (Signed)
[name] (at pharmacy) called again- pt is at pharmacy now to pick up meds. Please call back now. [phone].  Steven Wiley

## 2012-11-13 NOTE — Assessment & Plan Note (Signed)
He requested narcotic pain meds for pain related to coughing.  I refused citing that he can use over the counter meds for pain relief.

## 2012-11-13 NOTE — Patient Instructions (Signed)
Keep taking the [place] and [employer] as you are doing  Let us know which option is cheaper for you: A > Tudorza and Breo Ellipta B > Spiriva and Pulmicort nebulized and Brovana nebulized  We will refer you to [facility] for: -Full Pulmonary function testing -Pulmonary rehab -a Chest X-ray in 6 weeks  Take the prednisone and doxycycline as written  We will see you back in 4-6 week as needed
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
## 2012-11-13 NOTE — Telephone Encounter (Signed)
I spoke with the pharmacy and they states they wanted to clarify the rx because it was sent for doxycycline 50mg  take 2 tabs twice daily #14. I called and spoke with Verlon Au in [place] and she states it should be take doxycycline 100mg  one tab twice daily #14, pharmacy is aware. Carron Curie, CMA

## 2012-11-13 NOTE — Assessment & Plan Note (Signed)
He has multiple pulmonary nodules, pleural thickening, and ggo opacities in the R lung from a recent CT scan which look like scarring from an old infection vs. Mycobacterial disease.  His recent sputum sample were negative for AFB x3.  Plan: -repeat Chest imaging in 2-3 months -full pfts -depending on his follow up and results of imaging, consider serologic work up for connective tissue diseases (though I think this is much less likely).

## 2012-11-13 NOTE — Assessment & Plan Note (Signed)
Steven Wiley was recently hospitalized for an exacerbation of COPD. He notes ongoing wheezing, mucus production and shortness of breath which is consistent with his COPD. Fortunately, he stop smoking cigarettes and has not started since. At this point he needs more antibiotics and steroids.  I don't know how severe his COPD is because he has not had PFTs.  He is also deconditioned and could benefit from pulmonary rehab.  He is frustrated with the cost of his medications.  Plan: -continue [place], but he will investigate the cost of Spiriva and Brovana/Pulmicort and let us know if those are cheaper. -doxycycline and prednisone for 7 days now -pulm rehab referral -PFT's

## 2012-11-22 ENCOUNTER — Ambulatory Visit (INDEPENDENT_AMBULATORY_CARE_PROVIDER_SITE_OTHER): Payer: Medicaid Other | Admitting: Infectious Disease

## 2012-11-22 ENCOUNTER — Encounter: Payer: Self-pay | Admitting: Infectious Disease

## 2012-11-22 VITALS — BP 119/74 | HR 86 | Temp 98.5°F | Ht 69.0 in | Wt 179.0 lb

## 2012-11-22 DIAGNOSIS — J441 Chronic obstructive pulmonary disease with (acute) exacerbation: Secondary | ICD-10-CM

## 2012-11-22 DIAGNOSIS — J13 Pneumonia due to Streptococcus pneumoniae: Secondary | ICD-10-CM

## 2012-11-22 DIAGNOSIS — J181 Lobar pneumonia, unspecified organism: Secondary | ICD-10-CM

## 2012-11-22 DIAGNOSIS — R599 Enlarged lymph nodes, unspecified: Secondary | ICD-10-CM

## 2012-11-22 DIAGNOSIS — R05 Cough: Secondary | ICD-10-CM

## 2012-11-22 DIAGNOSIS — R590 Localized enlarged lymph nodes: Secondary | ICD-10-CM

## 2012-11-22 DIAGNOSIS — R509 Fever, unspecified: Secondary | ICD-10-CM

## 2012-11-22 MED ORDER — HYDROCOD POLST-CHLORPHEN POLST 10-8 MG/5ML PO LQCR: 5.0000 mL | Freq: Two times a day (BID) | ORAL | Status: DC | PRN

## 2012-11-22 MED ORDER — PREDNISONE 20 MG PO TABS: ORAL_TABLET | ORAL | Status: DC

## 2012-11-22 MED ORDER — OXYCODONE HCL 5 MG PO TABS: 5.0000 mg | ORAL_TABLET | Freq: Three times a day (TID) | ORAL | Status: DC | PRN

## 2012-11-22 NOTE — Progress Notes (Signed)
Subjective:    Patient ID: Steven Wiley, male    DOB: [birth year], 57 y.o.   MRN: [id number]  Cough Associated symptoms include chest pain. Pertinent negatives include no chills, fever, myalgias, rash, rhinorrhea, sore throat, shortness of breath or wheezing.    57 year old with COPD pituitary microadenoma, adrenal insufficiency, bipolar disorder admitted with myalgias, arthralgias, fever to 103 "worst headache in his life" worsening of SOB, sputum produciton, found to be hypotensive in the ED> CXR--> CT showed a  R apical density, tree in bud, R hilar LAN, ground glass opacities. He was started on levaquin 750mg , steroids, placed in negative pressure room for rule out TB. He had risk factors for TB with 10 year piror incarceration.   Sputa x3 negative for AFB smear thought some if not all donw with pt on levaquin. QF was indeterminate, HIV negative. He was ultimately DC to home with doxycycline and steroid taper. He presents for hospital fu in [facility]  Since dc he has run out of anti-tussive cough syrup and pain meds and cough is more difficult to bear with severe muscular pain in chest and abdomen. He denies fevers chills nausea or increased sputum production.     Review of Systems  Constitutional: Positive for diaphoresis, activity change, fatigue and unexpected weight change. Negative for fever, chills and appetite change.  HENT: Negative for congestion, sore throat, rhinorrhea, sneezing, trouble swallowing and sinus pressure.   Eyes: Negative for photophobia and visual disturbance.  Respiratory: Positive for cough and chest tightness. Negative for shortness of breath, wheezing and stridor.   Cardiovascular: Positive for chest pain. Negative for palpitations and leg swelling.  Gastrointestinal: Positive for abdominal pain. Negative for nausea, vomiting, diarrhea, constipation, blood in stool, abdominal distention and anal bleeding.  Genitourinary: Negative for dysuria, hematuria, flank pain  and difficulty urinating.  Musculoskeletal: Negative for myalgias, back pain, joint swelling, arthralgias and gait problem.  Skin: Negative for color change, pallor, rash and wound.  Neurological: Negative for dizziness, tremors, weakness and light-headedness.  Hematological: Negative for adenopathy. Does not bruise/bleed easily.  Psychiatric/Behavioral: Negative for behavioral problems, confusion, sleep disturbance, dysphoric mood, decreased concentration and agitation.       Objective:   Physical Exam  Constitutional: He is oriented to person, place, and time. He appears well-developed and well-nourished. No distress.  HENT:  Head: Normocephalic and atraumatic.  Mouth/Throat: Oropharynx is clear and moist. No oropharyngeal exudate.  Eyes: Conjunctivae and EOM are normal. Pupils are equal, round, and reactive to light. No scleral icterus.  Neck: Normal range of motion. Neck supple. No JVD present.  Cardiovascular: Regular rhythm and normal heart sounds.  Tachycardia present.  Exam reveals no gallop and no friction rub.   No murmur heard. Pulmonary/Chest: Effort normal. No respiratory distress. He has wheezes. He has rales. He exhibits no tenderness.  Abdominal: He exhibits no distension and no mass. There is no tenderness. There is no rebound and no guarding.  Musculoskeletal: He exhibits no edema and no tenderness.  Lymphadenopathy:    He has no cervical adenopathy.  Neurological: He is alert and oriented to person, place, and time. He has normal reflexes. He exhibits normal muscle tone. Coordination normal.  Skin: Skin is warm and dry. He is not diaphoretic. No erythema. No pallor.  Psychiatric: He has a normal mood and affect. His behavior is normal. Judgment and thought content normal.          Assessment & Plan:   RUL apical density with LA, in  pt with 25# weight loss, fevers, headaches, and prior incarceration: --TB certainly something still to keep in differential though  AFB x3 were negative.  -Similarly fungal infection with dimorphic vs reaction to a mold possible --nocardia and actino also in differential from ID standpoint,  --malignancy and sarcoid also to be considered --for now would continue current therapy of doxy , steroids (which I will increase) and have him fu with Dr Henrene Pastor w [facility], I think much of his worsening ssx are due to lack of anti-tussives, and pain meds --if fevers return or symptoms worsen he then needs bronchoscopy with BAL for AFB, fungal cultures, nocardia,  Aerobic/anerobic cultures +/- biopsy of LN  Cough; seems worse in terms of his tolerance but I will re rx his cough syrup and oral narcotic  COPD: his wheezing seems fairly severe,. I am also increasing the steroids back up to 40 x 7 d and then 20 x 7 d  Prior to fu with [facility]

## 2012-12-04 ENCOUNTER — Ambulatory Visit: Payer: Self-pay | Admitting: Pulmonary Disease

## 2012-12-10 ENCOUNTER — Telehealth: Payer: Self-pay | Admitting: Licensed Clinical Social Worker

## 2012-12-10 ENCOUNTER — Encounter: Payer: Self-pay | Admitting: Pulmonary Disease

## 2012-12-10 ENCOUNTER — Telehealth: Payer: Self-pay | Admitting: *Deleted

## 2012-12-10 NOTE — Telephone Encounter (Signed)
He needs to talk to Dr. Kendrick Fries (his pulmonologist)  and his PCP re ALL of this stuff. I did him a favor of refilling these narcotics and his steroids, but I am the CONSULANT FOR ID here

## 2012-12-10 NOTE — Telephone Encounter (Signed)
Message    L,      Mr. Schreiner has been asking for refills on his [place]. According to epic he has two refills remaining (first written in 10/2012). Please let him know this when he calls you back.      I am NOT OK refilling other meds until he sees me again. From what I can tell from the chart he has been on prednisone for nearly a month. If he is still having symptoms then he needs to be seen. We have plenty of open spots this week.      If he asks about narcotics, tell him the same thing I told him in his visit with me>> find a PCP.      Thanks,      Kipp Brood  Will await a call back from pt to advise on PFT results as well as the above recs re meds per Dr. Cindie Crumbly

## 2012-12-10 NOTE — Telephone Encounter (Signed)
Patient wants a refill on his inhaler  Tudorza, oxycodone, and he wants to know if he still needs to be on prednisone. He has an appointment with a PCP on 6/26 Dr. Myrene Galas in [place]. He wants enough until then. Please advise

## 2012-12-10 NOTE — Telephone Encounter (Signed)
Message copied by Christen Butter on Mon Dec 10, 2012  9:24 AM ------      Message from: Max Fickle B      Created: Mon Dec 10, 2012  9:03 AM       L,            Please let him know that his PFTs showed COPD and that there is no reason to change his medications based on the result of the study.              Thanks      B ------

## 2012-12-10 NOTE — Telephone Encounter (Signed)
LMTCB for the pt 

## 2012-12-11 ENCOUNTER — Ambulatory Visit (INDEPENDENT_AMBULATORY_CARE_PROVIDER_SITE_OTHER): Payer: Medicaid Other | Admitting: Pulmonary Disease

## 2012-12-11 ENCOUNTER — Telehealth: Payer: Self-pay | Admitting: Pulmonary Disease

## 2012-12-11 ENCOUNTER — Encounter: Payer: Self-pay | Admitting: Pulmonary Disease

## 2012-12-11 VITALS — BP 110/60 | HR 78 | Temp 97.8°F | Ht 69.0 in | Wt 178.0 lb

## 2012-12-11 DIAGNOSIS — J189 Pneumonia, unspecified organism: Secondary | ICD-10-CM

## 2012-12-11 DIAGNOSIS — F1910 Other psychoactive substance abuse, uncomplicated: Secondary | ICD-10-CM

## 2012-12-11 DIAGNOSIS — R9389 Abnormal findings on diagnostic imaging of other specified body structures: Secondary | ICD-10-CM

## 2012-12-11 DIAGNOSIS — R918 Other nonspecific abnormal finding of lung field: Secondary | ICD-10-CM

## 2012-12-11 DIAGNOSIS — J449 Chronic obstructive pulmonary disease, unspecified: Secondary | ICD-10-CM

## 2012-12-11 DIAGNOSIS — Z765 Malingerer [conscious simulation]: Secondary | ICD-10-CM

## 2012-12-11 LAB — AFB CULTURE WITH SMEAR (NOT AT ARMC): Acid Fast Smear: NONE SEEN

## 2012-12-11 MED ORDER — FORMOTEROL FUMARATE 20 MCG/2ML IN NEBU: 20.0000 ug | INHALATION_SOLUTION | Freq: Two times a day (BID) | RESPIRATORY_TRACT | Status: DC

## 2012-12-11 NOTE — Telephone Encounter (Signed)
I spoke with [name] at pharmacy and she states perforomist is needing a PA # [phone]. I called and initiated PA over the phone nad received an approval # [id number]. Approved x 1 year. Pharmacy is aware. Carron Curie, CMA

## 2012-12-11 NOTE — Assessment & Plan Note (Addendum)
I again reviewed the CT images which were taken when he had a clinical diagnosis of pneumonia. The findings of predominantly tree in bud abnormalities with pleural thickening are suggestive of an atypical mycobacterial disease versus aspiration pneumonia versus less likely sarcoidosis. Considering the fact that this image is taking during the time that he had pneumonia these findings could really just be related to an acute bacterial infection.  Plan: -As noted prior we will repeat a CT scan in August -If he still has the CT abnormalities then we will consider bronchoscopy, however considering the severity of the emphysema seen, doing a transbronchial biopsy would be a very poor decision.
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
## 2012-12-11 NOTE — Assessment & Plan Note (Signed)
He has moderate obstruction on lung function testing but considering the severity of his symptoms I did overall he has severe disease, GOLD grade C.  A significant portion of his wheezing is do to vocal cord dysfunction as noted by the fact that when I asked him to "breathe normally" the wheezing went away.  He is currently not an exacerbation of COPD. I question his compliance of medications. He seems to have a better response to nebulized medicines.  Plan: -Stop BREO -Start to perforomist -Continue Pulmicort -use albuterol neb as needed -use mucinex as needed for mucus production/chest congestion

## 2012-12-11 NOTE — Telephone Encounter (Signed)
The pt was seen by Dr Kendrick Fries today so I will close this encounter

## 2012-12-11 NOTE — Patient Instructions (Signed)
We will order a CT of your chest for August and see you after that  Stop taking the [facility] taking the perforomist twice a day with the pulmicort  Use Tudorza twice a day  Use albuterol as needed  Get an appointment with your PCP to discuss the narcotics  We will see you back after the CT scan of your chest in August

## 2012-12-11 NOTE — Assessment & Plan Note (Signed)
He again asked for narcotics. I again said no. I told him to talk to his primary care physician regarding pain medications. I see no reason why he needs narcotics to treat his COPD.

## 2012-12-13 LAB — AFB CULTURE WITH SMEAR (NOT AT ARMC)

## 2012-12-27 ENCOUNTER — Ambulatory Visit: Payer: Medicaid Other | Admitting: Infectious Disease

## 2013-01-23 ENCOUNTER — Encounter: Payer: Self-pay | Admitting: Infectious Disease

## 2013-01-23 ENCOUNTER — Ambulatory Visit
Admission: RE | Admit: 2013-01-23 | Discharge: 2013-01-23 | Disposition: A | Discharge status: Home / Self Care | Payer: Medicaid Other | Source: Ambulatory Visit | Attending: Infectious Disease | Admitting: Infectious Disease

## 2013-01-23 ENCOUNTER — Ambulatory Visit (INDEPENDENT_AMBULATORY_CARE_PROVIDER_SITE_OTHER): Payer: Medicaid Other | Admitting: Infectious Disease

## 2013-01-23 VITALS — BP 128/82 | HR 67 | Temp 98.1°F | Wt 189.0 lb

## 2013-01-23 DIAGNOSIS — J441 Chronic obstructive pulmonary disease with (acute) exacerbation: Secondary | ICD-10-CM

## 2013-01-23 DIAGNOSIS — K439 Ventral hernia without obstruction or gangrene: Secondary | ICD-10-CM

## 2013-01-23 DIAGNOSIS — R059 Cough, unspecified: Secondary | ICD-10-CM | POA: Insufficient documentation

## 2013-01-23 DIAGNOSIS — R05 Cough: Secondary | ICD-10-CM

## 2013-01-23 DIAGNOSIS — F1910 Other psychoactive substance abuse, uncomplicated: Secondary | ICD-10-CM

## 2013-01-23 DIAGNOSIS — J189 Pneumonia, unspecified organism: Secondary | ICD-10-CM

## 2013-01-23 DIAGNOSIS — R911 Solitary pulmonary nodule: Secondary | ICD-10-CM

## 2013-01-23 DIAGNOSIS — Z765 Malingerer [conscious simulation]: Secondary | ICD-10-CM

## 2013-01-23 DIAGNOSIS — J383 Other diseases of vocal cords: Secondary | ICD-10-CM

## 2013-01-23 MED ORDER — DOXYCYCLINE HYCLATE 100 MG PO TABS: 100.0000 mg | ORAL_TABLET | Freq: Two times a day (BID) | ORAL | Status: DC

## 2013-01-23 MED ORDER — PREDNISONE 20 MG PO TABS: 60.0000 mg | ORAL_TABLET | Freq: Every day | ORAL | Status: DC

## 2013-01-23 NOTE — Progress Notes (Signed)
Subjective:    Patient ID: Steven Wiley, male    DOB: 1956-06-05, 57 y.o.   MRN: [id number]  HPI  57 year old with COPD pituitary microadenoma, adrenal insufficiency, bipolar disorder [facility] months ago with myalgias, arthralgias, fever to 103 "worst headache in his life" worsening of SOB, sputum produciton, found to be hypotensive in the ED> CXR--> CT showed a  R apical density, tree in bud, R hilar LAN, ground glass opacities. He was started on levaquin 750mg , steroids, placed in negative pressure room for rule out TB. He had risk factors for TB with 10 year piror incarceration.   Sputa x3 negative for AFB smear though some if not all donw with pt on levaquin. QF was indeterminate, HIV negative. He was ultimately DC to home with doxycycline and steroid taper.   I saw him in followup and he was again uncomfortable coughing, wheezing and apparently out of anti-tussive cough syrup and pain meds and cough. At that time I wrote script for steroids, and gave him narcotic anti-tussive for short term. He was since seen twice by Dr. Kendrick Fries who felt that much of the pts wheezing was due to vocal cord dysfunction though he felt pt also likely with moderate obstructive COPD. He requeted narcotics from Dr. Kendrick Fries again as well but he refused and referred pt to his PCP. He returns to clinci to see me today c/o gasping for air, cough with productive sputum but no fevers, chest pain with severe coughing and exacerbation of his hernia and again requested pain meds. I told him that I was the wrong subspecialist to see for COPD management but that I could give him another short course of steroids and doxycycline with repeat imaging of his chest. The priniciple area of concern for me would be the fijndings on CT and that I would like to repeat the CT 3 months from his last one if his pulmonary MD's agreed. He voiced displeasure with Dr. Kendrick Fries stating that " he and I are not on the same  page."   Review of Systems  Constitutional: Positive for fatigue. Negative for diaphoresis, activity change, appetite change and unexpected weight change.  HENT: Negative for congestion, sneezing, trouble swallowing and sinus pressure.   Eyes: Negative for photophobia and visual disturbance.  Respiratory: Positive for cough, chest tightness, shortness of breath and wheezing. Negative for stridor.   Cardiovascular: Negative for palpitations.  Gastrointestinal: Positive for abdominal pain. Negative for nausea, diarrhea, constipation, blood in stool, abdominal distention and anal bleeding.  Genitourinary: Negative for dysuria, hematuria, flank pain and difficulty urinating.  Musculoskeletal: Negative for back pain, joint swelling, arthralgias and gait problem.  Skin: Negative for color change, pallor and wound.  Neurological: Negative for dizziness, tremors, weakness and light-headedness.  Hematological: Negative for adenopathy. Does not bruise/bleed easily.  Psychiatric/Behavioral: Negative for behavioral problems, confusion, sleep disturbance, dysphoric mood, decreased concentration and agitation.       Objective:   Physical Exam  Constitutional: He is oriented to person, place, and time. He appears well-developed and well-nourished. No distress.  HENT:  Head: Normocephalic and atraumatic.  Mouth/Throat: Oropharynx is clear and moist. No oropharyngeal exudate.  Eyes: Conjunctivae and EOM are normal. Pupils are equal, round, and reactive to light. No scleral icterus.  Neck: Normal range of motion. Neck supple. No JVD present.  Cardiovascular: Regular rhythm and normal heart sounds.  Tachycardia present.  Exam reveals no gallop and no friction rub.   No murmur heard. Pulmonary/Chest: Effort normal.  No respiratory distress. He has wheezes. He has rales. He exhibits no tenderness.  Abdominal: He exhibits no distension and no mass. There is tenderness. There is no rebound and no guarding.   Musculoskeletal: He exhibits no edema and no tenderness.  Lymphadenopathy:    He has no cervical adenopathy.  Neurological: He is alert and oriented to person, place, and time. He has normal reflexes. He exhibits normal muscle tone. Coordination normal.  Skin: Skin is warm and dry. He is not diaphoretic. No erythema. No pallor.  Psychiatric: His behavior is normal. Judgment and thought content normal. His mood appears anxious.          Assessment & Plan:   RUL apical density with LA, in pt with 25# weight loss, fevers, headaches, and prior incarceration: --TB certainly something still to keep in differential though AFB x3 were negative.  -Similarly fungal infection with dimorphic vs reaction to a mold possible --nocardia and actino also in differential from ID standpoint,  --malignancy and sarcoid also to be considered --I would recommend repeat CT scan of the chest 3 months from the last one (August ) but would want to coordiante with CCM. THey are worried that biopsy pursuit could be quite dangerous in this man   COPD: his wheezing seems again fairly severe with DOE, coughing, though some of this may be his vocal cord dysfunction as well --gave 10 day course of prednisone and doxy --needs to be plugged in with pulmonary and also with his PCP  Narcotic seeking behavior: he again requested narcotics from me. Will need to take this up with PCP  Hernia: I doubt this will be trivial to try to repair surgically given his lung disease. Again NOT my province

## 2013-03-06 ENCOUNTER — Ambulatory Visit: Payer: Medicaid Other | Admitting: Infectious Disease

## 2013-12-06 ENCOUNTER — Inpatient Hospital Stay: Payer: Self-pay | Admitting: Internal Medicine

## 2013-12-06 LAB — BASIC METABOLIC PANEL
ANION GAP: 4 — AB (ref 7–16)
BUN: 19 mg/dL — ABNORMAL HIGH (ref 7–18)
CALCIUM: 8.5 mg/dL (ref 8.5–10.1)
CHLORIDE: 105 mmol/L (ref 98–107)
Co2: 32 mmol/L (ref 21–32)
Creatinine: 0.99 mg/dL (ref 0.60–1.30)
EGFR (African American): >60
Glucose: 89 mg/dL (ref 65–99)
OSMOLALITY: 283 (ref 275–301)
POTASSIUM: 4 mmol/L (ref 3.5–5.1)
Sodium: 141 mmol/L (ref 136–145)

## 2013-12-06 LAB — CBC
HCT: 41.8 % (ref 40.0–52.0)
HGB: 13.6 g/dL (ref 13.0–18.0)
MCH: 32.6 pg (ref 26.0–34.0)
MCHC: 32.6 g/dL (ref 32.0–36.0)
MCV: 100 fL (ref 80–100)
Platelet: 187 10*3/uL (ref 150–440)
RBC: 4.17 10*6/uL — AB (ref 4.40–5.90)
RDW: 13.6 % (ref 11.5–14.5)
WBC: 6.2 10*3/uL (ref 3.8–10.6)

## 2013-12-06 LAB — URINALYSIS, COMPLETE
BILIRUBIN, UR: NEGATIVE
Bacteria: NONE SEEN
Blood: NEGATIVE
GLUCOSE, UR: NEGATIVE mg/dL (ref 0–75)
Ketone: NEGATIVE
Leukocyte Esterase: NEGATIVE
Nitrite: NEGATIVE
PH: 6 (ref 4.5–8.0)
Protein: NEGATIVE
RBC,UR: <1 /HPF (ref 0–5)
SPECIFIC GRAVITY: 1.01 (ref 1.003–1.030)
Squamous Epithelial: NONE SEEN
WBC UR: NONE SEEN /HPF (ref 0–5)

## 2013-12-06 LAB — HEPATIC FUNCTION PANEL A (ARMC)
ALK PHOS: 108 U/L
Albumin: 3.6 g/dL (ref 3.4–5.0)
Bilirubin,Total: 0.4 mg/dL (ref 0.2–1.0)
SGOT(AST): 67 U/L — ABNORMAL HIGH (ref 15–37)
SGPT (ALT): 57 U/L (ref 12–78)
TOTAL PROTEIN: 7.3 g/dL (ref 6.4–8.2)

## 2013-12-06 LAB — LIPASE, BLOOD: Lipase: 158 U/L (ref 73–393)

## 2013-12-06 LAB — PRO B NATRIURETIC PEPTIDE: B-Type Natriuretic Peptide: 102 pg/mL (ref 0–125)

## 2013-12-06 LAB — TROPONIN I: Troponin-I: <0.02 ng/mL

## 2013-12-06 LAB — ETHANOL
Ethanol %: 0.008 % (ref 0.000–0.080)
Ethanol: 8 mg/dL

## 2013-12-06 LAB — CK TOTAL AND CKMB (NOT AT ARMC)
CK, TOTAL: 893 U/L — AB
CK-MB: 1.5 ng/mL (ref 0.5–3.6)

## 2013-12-06 LAB — ACETAMINOPHEN LEVEL: Acetaminophen: <2 ug/mL

## 2013-12-06 LAB — SALICYLATE LEVEL: SALICYLATES, SERUM: 1.9 mg/dL

## 2013-12-06 LAB — AMMONIA: AMMONIA, PLASMA: 46 umol/L — AB (ref 11–32)

## 2013-12-07 DIAGNOSIS — I517 Cardiomegaly: Secondary | ICD-10-CM

## 2013-12-07 LAB — CBC WITH DIFFERENTIAL/PLATELET
BASOS ABS: 0 10*3/uL (ref 0.0–0.1)
Basophil %: 0.2 %
Eosinophil #: 0 10*3/uL (ref 0.0–0.7)
Eosinophil %: 0 %
HCT: 43.7 % (ref 40.0–52.0)
HGB: 14.4 g/dL (ref 13.0–18.0)
Lymphocyte #: 0.4 10*3/uL — ABNORMAL LOW (ref 1.0–3.6)
Lymphocyte %: 7.4 %
MCH: 33.3 pg (ref 26.0–34.0)
MCHC: 33.1 g/dL (ref 32.0–36.0)
MCV: 101 fL — AB (ref 80–100)
Monocyte #: 0 x10 3/mm — ABNORMAL LOW (ref 0.2–1.0)
Monocyte %: 0.6 %
Neutrophil #: 5 10*3/uL (ref 1.4–6.5)
Neutrophil %: 91.8 %
Platelet: 181 10*3/uL (ref 150–440)
RBC: 4.34 10*6/uL — ABNORMAL LOW (ref 4.40–5.90)
RDW: 14.1 % (ref 11.5–14.5)
WBC: 5.4 10*3/uL (ref 3.8–10.6)

## 2013-12-07 LAB — BASIC METABOLIC PANEL
Anion Gap: 7 (ref 7–16)
BUN: 20 mg/dL — AB (ref 7–18)
CHLORIDE: 100 mmol/L (ref 98–107)
CO2: 31 mmol/L (ref 21–32)
CREATININE: 1.22 mg/dL (ref 0.60–1.30)
Calcium, Total: 9 mg/dL (ref 8.5–10.1)
EGFR (Non-African Amer.): >60
Glucose: 179 mg/dL — ABNORMAL HIGH (ref 65–99)
OSMOLALITY: 283 (ref 275–301)
Potassium: 4.3 mmol/L (ref 3.5–5.1)
Sodium: 138 mmol/L (ref 136–145)

## 2013-12-07 LAB — CK TOTAL AND CKMB (NOT AT ARMC)
CK, Total: 697 U/L — ABNORMAL HIGH
CK-MB: 1.3 ng/mL (ref 0.5–3.6)

## 2013-12-07 LAB — TROPONIN I
Troponin-I: <0.02 ng/mL
Troponin-I: <0.02 ng/mL

## 2013-12-07 LAB — AMMONIA: AMMONIA, PLASMA: 15 umol/L (ref 11–32)

## 2013-12-08 LAB — BASIC METABOLIC PANEL
Anion Gap: 4 — ABNORMAL LOW (ref 7–16)
BUN: 22 mg/dL — AB (ref 7–18)
CALCIUM: 9 mg/dL (ref 8.5–10.1)
CO2: 31 mmol/L (ref 21–32)
CREATININE: 1.12 mg/dL (ref 0.60–1.30)
Chloride: 100 mmol/L (ref 98–107)
EGFR (Non-African Amer.): >60
Glucose: 221 mg/dL — ABNORMAL HIGH (ref 65–99)
OSMOLALITY: 280 (ref 275–301)
Potassium: 4.6 mmol/L (ref 3.5–5.1)
Sodium: 135 mmol/L — ABNORMAL LOW (ref 136–145)

## 2013-12-09 LAB — BASIC METABOLIC PANEL
Anion Gap: 1 — ABNORMAL LOW (ref 7–16)
BUN: 22 mg/dL — ABNORMAL HIGH (ref 7–18)
CALCIUM: 9.1 mg/dL (ref 8.5–10.1)
CHLORIDE: 102 mmol/L (ref 98–107)
Co2: 33 mmol/L — ABNORMAL HIGH (ref 21–32)
Creatinine: 1.05 mg/dL (ref 0.60–1.30)
EGFR (African American): >60
EGFR (Non-African Amer.): >60
Glucose: 201 mg/dL — ABNORMAL HIGH (ref 65–99)
Osmolality: 281 (ref 275–301)
POTASSIUM: 4.8 mmol/L (ref 3.5–5.1)
Sodium: 136 mmol/L (ref 136–145)

## 2013-12-09 LAB — HEMOGLOBIN A1C: Hemoglobin A1C: 5.9 % (ref 4.2–6.3)

## 2013-12-11 LAB — HEMOGLOBIN: HGB: 15.1 g/dL (ref 13.0–18.0)

## 2013-12-13 LAB — CREATININE, SERUM
Creatinine: 0.99 mg/dL (ref 0.60–1.30)
EGFR (African American): >60

## 2014-01-25 ENCOUNTER — Ambulatory Visit: Payer: Self-pay | Admitting: Internal Medicine

## 2014-01-27 ENCOUNTER — Inpatient Hospital Stay: Payer: Self-pay | Admitting: Internal Medicine

## 2014-01-27 LAB — URINALYSIS, COMPLETE
BILIRUBIN, UR: NEGATIVE
Bacteria: NONE SEEN
Blood: NEGATIVE
GLUCOSE, UR: NEGATIVE mg/dL (ref 0–75)
Ketone: NEGATIVE
Leukocyte Esterase: NEGATIVE
NITRITE: NEGATIVE
PROTEIN: NEGATIVE
Ph: 6 (ref 4.5–8.0)
SPECIFIC GRAVITY: 1.023 (ref 1.003–1.030)
SQUAMOUS EPITHELIAL: NONE SEEN

## 2014-01-27 LAB — BASIC METABOLIC PANEL
Anion Gap: 7 (ref 7–16)
BUN: 14 mg/dL (ref 7–18)
CO2: 31 mmol/L (ref 21–32)
Calcium, Total: 7.5 mg/dL — ABNORMAL LOW (ref 8.5–10.1)
Chloride: 99 mmol/L (ref 98–107)
Creatinine: 1.2 mg/dL (ref 0.60–1.30)
EGFR (African American): >60
Glucose: 117 mg/dL — ABNORMAL HIGH (ref 65–99)
Osmolality: 275 (ref 275–301)
Potassium: 4.1 mmol/L (ref 3.5–5.1)
Sodium: 137 mmol/L (ref 136–145)

## 2014-01-27 LAB — CK TOTAL AND CKMB (NOT AT ARMC)
CK, TOTAL: 247 U/L
CK-MB: 1.2 ng/mL (ref 0.5–3.6)

## 2014-01-27 LAB — CBC
HCT: 37.5 % — AB (ref 40.0–52.0)
HGB: 12.6 g/dL — ABNORMAL LOW (ref 13.0–18.0)
MCH: 32.7 pg (ref 26.0–34.0)
MCHC: 33.5 g/dL (ref 32.0–36.0)
MCV: 98 fL (ref 80–100)
Platelet: 146 10*3/uL — ABNORMAL LOW (ref 150–440)
RBC: 3.85 10*6/uL — ABNORMAL LOW (ref 4.40–5.90)
RDW: 13.5 % (ref 11.5–14.5)
WBC: 11.6 10*3/uL — ABNORMAL HIGH (ref 3.8–10.6)

## 2014-01-27 LAB — PRO B NATRIURETIC PEPTIDE: B-Type Natriuretic Peptide: 708 pg/mL — ABNORMAL HIGH (ref 0–125)

## 2014-01-27 LAB — TROPONIN I

## 2014-01-28 LAB — CBC WITH DIFFERENTIAL/PLATELET
BASOS ABS: 0 10*3/uL (ref 0.0–0.1)
BASOS PCT: 0.2 %
EOS ABS: 0 10*3/uL (ref 0.0–0.7)
Eosinophil %: 0 %
HCT: 39.1 % — AB (ref 40.0–52.0)
HGB: 13 g/dL (ref 13.0–18.0)
LYMPHS PCT: 4.8 %
Lymphocyte #: 0.4 10*3/uL — ABNORMAL LOW (ref 1.0–3.6)
MCH: 32.7 pg (ref 26.0–34.0)
MCHC: 33.2 g/dL (ref 32.0–36.0)
MCV: 99 fL (ref 80–100)
MONOS PCT: 1.2 %
Monocyte #: 0.1 x10 3/mm — ABNORMAL LOW (ref 0.2–1.0)
NEUTROS ABS: 8.5 10*3/uL — AB (ref 1.4–6.5)
NEUTROS PCT: 93.8 %
PLATELETS: 121 10*3/uL — AB (ref 150–440)
RBC: 3.96 10*6/uL — AB (ref 4.40–5.90)
RDW: 13.8 % (ref 11.5–14.5)
WBC: 9 10*3/uL (ref 3.8–10.6)

## 2014-01-28 LAB — BASIC METABOLIC PANEL
Anion Gap: 11 (ref 7–16)
BUN: 17 mg/dL (ref 7–18)
CHLORIDE: 96 mmol/L — AB (ref 98–107)
CO2: 30 mmol/L (ref 21–32)
CREATININE: 1.45 mg/dL — AB (ref 0.60–1.30)
Calcium, Total: 8.2 mg/dL — ABNORMAL LOW (ref 8.5–10.1)
EGFR (African American): >60
GFR CALC NON AF AMER: 52 — AB
GLUCOSE: 255 mg/dL — AB (ref 65–99)
OSMOLALITY: 284 (ref 275–301)
POTASSIUM: 3.8 mmol/L (ref 3.5–5.1)
Sodium: 137 mmol/L (ref 136–145)

## 2014-01-29 LAB — BASIC METABOLIC PANEL
ANION GAP: 5 — AB (ref 7–16)
BUN: 15 mg/dL (ref 7–18)
CREATININE: 1.14 mg/dL (ref 0.60–1.30)
Calcium, Total: 8.3 mg/dL — ABNORMAL LOW (ref 8.5–10.1)
Chloride: 101 mmol/L (ref 98–107)
Co2: 31 mmol/L (ref 21–32)
EGFR (African American): >60
Glucose: 186 mg/dL — ABNORMAL HIGH (ref 65–99)
Osmolality: 280 (ref 275–301)
POTASSIUM: 4.7 mmol/L (ref 3.5–5.1)
SODIUM: 137 mmol/L (ref 136–145)

## 2014-01-29 LAB — CBC WITH DIFFERENTIAL/PLATELET
Basophil #: 0 10*3/uL (ref 0.0–0.1)
Basophil %: 0.1 %
Eosinophil #: 0 10*3/uL (ref 0.0–0.7)
Eosinophil %: 0 %
HCT: 37.1 % — ABNORMAL LOW (ref 40.0–52.0)
HGB: 12.4 g/dL — ABNORMAL LOW (ref 13.0–18.0)
Lymphocyte #: 0.4 10*3/uL — ABNORMAL LOW (ref 1.0–3.6)
Lymphocyte %: 3.4 %
MCH: 32.5 pg (ref 26.0–34.0)
MCHC: 33.3 g/dL (ref 32.0–36.0)
MCV: 98 fL (ref 80–100)
MONO ABS: 0.7 x10 3/mm (ref 0.2–1.0)
Monocyte %: 5.4 %
Neutrophil #: 11.9 10*3/uL — ABNORMAL HIGH (ref 1.4–6.5)
Neutrophil %: 91.1 %
PLATELETS: 136 10*3/uL — AB (ref 150–440)
RBC: 3.8 10*6/uL — ABNORMAL LOW (ref 4.40–5.90)
RDW: 13.4 % (ref 11.5–14.5)
WBC: 13 10*3/uL — ABNORMAL HIGH (ref 3.8–10.6)

## 2014-02-01 LAB — CULTURE, BLOOD (SINGLE)

## 2014-02-01 LAB — EXPECTORATED SPUTUM ASSESSMENT W GRAM STAIN, RFLX TO RESP C

## 2014-02-02 LAB — CREATININE, SERUM
Creatinine: 1.01 mg/dL (ref 0.60–1.30)
EGFR (African American): >60
EGFR (Non-African Amer.): >60

## 2014-02-02 LAB — HEMOGLOBIN: HGB: 14.1 g/dL (ref 13.0–18.0)

## 2014-02-22 DIAGNOSIS — F329 Major depressive disorder, single episode, unspecified: Secondary | ICD-10-CM | POA: Insufficient documentation

## 2014-02-22 DIAGNOSIS — Z8781 Personal history of (healed) traumatic fracture: Secondary | ICD-10-CM | POA: Insufficient documentation

## 2014-02-22 DIAGNOSIS — M549 Dorsalgia, unspecified: Secondary | ICD-10-CM | POA: Insufficient documentation

## 2014-02-22 DIAGNOSIS — F32A Depression, unspecified: Secondary | ICD-10-CM | POA: Insufficient documentation

## 2014-02-24 DIAGNOSIS — F4310 Post-traumatic stress disorder, unspecified: Secondary | ICD-10-CM | POA: Insufficient documentation

## 2014-02-25 ENCOUNTER — Ambulatory Visit: Payer: Self-pay | Admitting: Internal Medicine

## 2014-04-15 ENCOUNTER — Telehealth: Payer: Self-pay | Admitting: Pulmonary Disease

## 2014-04-15 NOTE — Telephone Encounter (Signed)
LMTCB x 1 for [place]

## 2014-04-16 NOTE — Telephone Encounter (Signed)
lmtcb for [employer]

## 2014-04-16 NOTE — Telephone Encounter (Signed)
[name] returned call. Please call back [phone] or [phone]

## 2014-04-16 NOTE — Telephone Encounter (Signed)
Spoke with [name] [facility]m Re[facility] Pt has not seen anyone in our office in the last year.  Pt last sen 12/11/12 [name] would like to know if pt needs to be seen by Dr Lake Bells before starting [name] states that he "thinks" that the patient has been seen in the hospital recently.   Please advise Dr Lake Bells. Thanks.

## 2014-04-18 NOTE — Telephone Encounter (Signed)
I'm happy for him to start pulmonary rehab but he needs to see me soon

## 2014-04-21 NOTE — Telephone Encounter (Signed)
[name] returning call.Hillery Hunter

## 2014-04-21 NOTE — Telephone Encounter (Signed)
Spoke with Steven Wiley) - aware that the patient can start Pulm Rehab, will need to have an OV with BQ soon Called and LM with pt to return call to schedule OV with BQ

## 2014-04-22 NOTE — Telephone Encounter (Signed)
Spoke with pt-aware of appt needed and scheduled for 05-13-14 with BQ in [place] office. Reminder card sent via mail at patients request.

## 2014-04-30 DIAGNOSIS — R070 Pain in throat: Secondary | ICD-10-CM | POA: Insufficient documentation

## 2014-05-08 IMAGING — CR DG CHEST 2V
2 series · 2 of 2 positions shown · non-contrast
Comparison: 11/01/2012 and multiple prior chest radiographs dating
back to 08/04/2007

CLINICAL DATA: 57-year-old male with chest pain and shortness of
breath.

CHEST - 2 VIEW

[w chest pa]
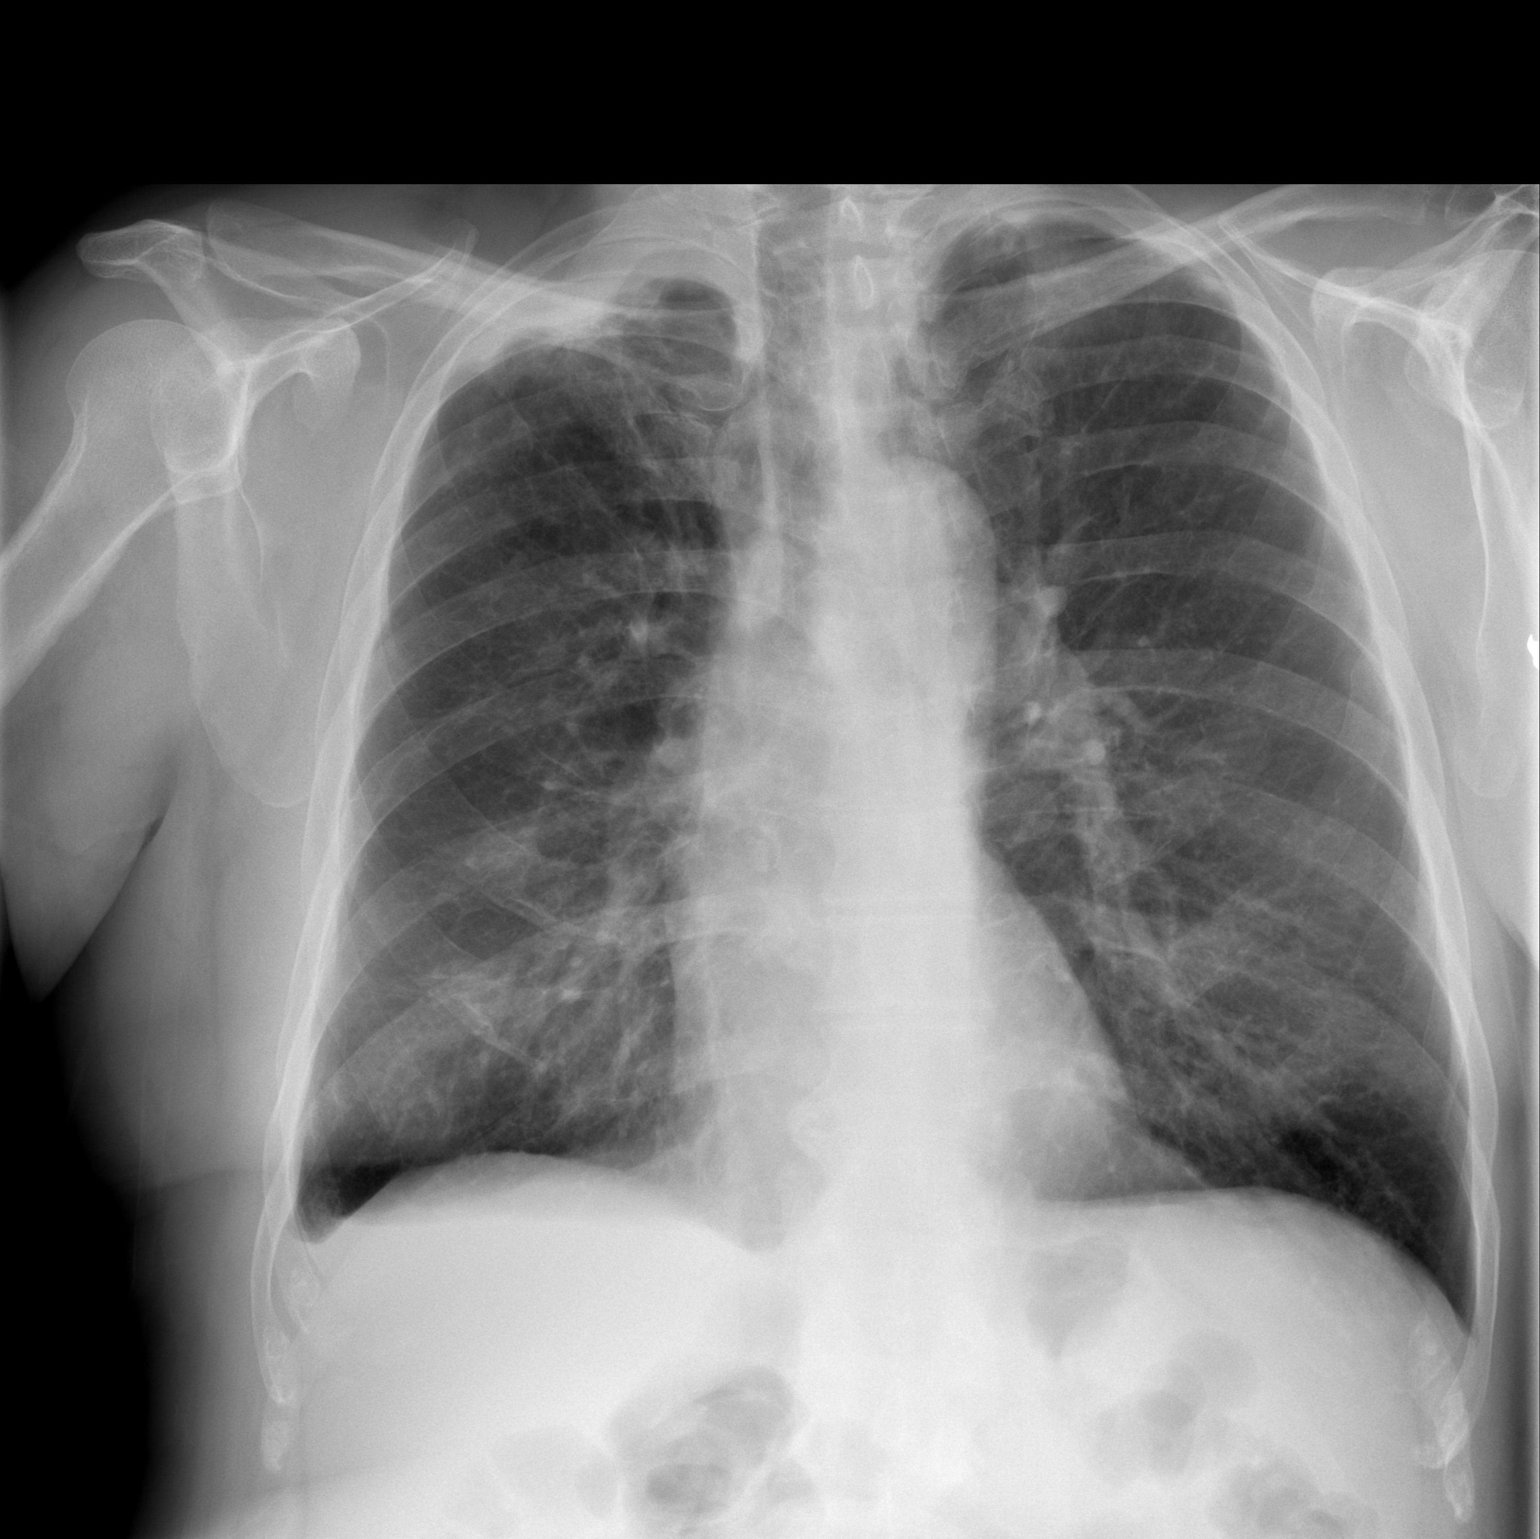

[w chest lat]
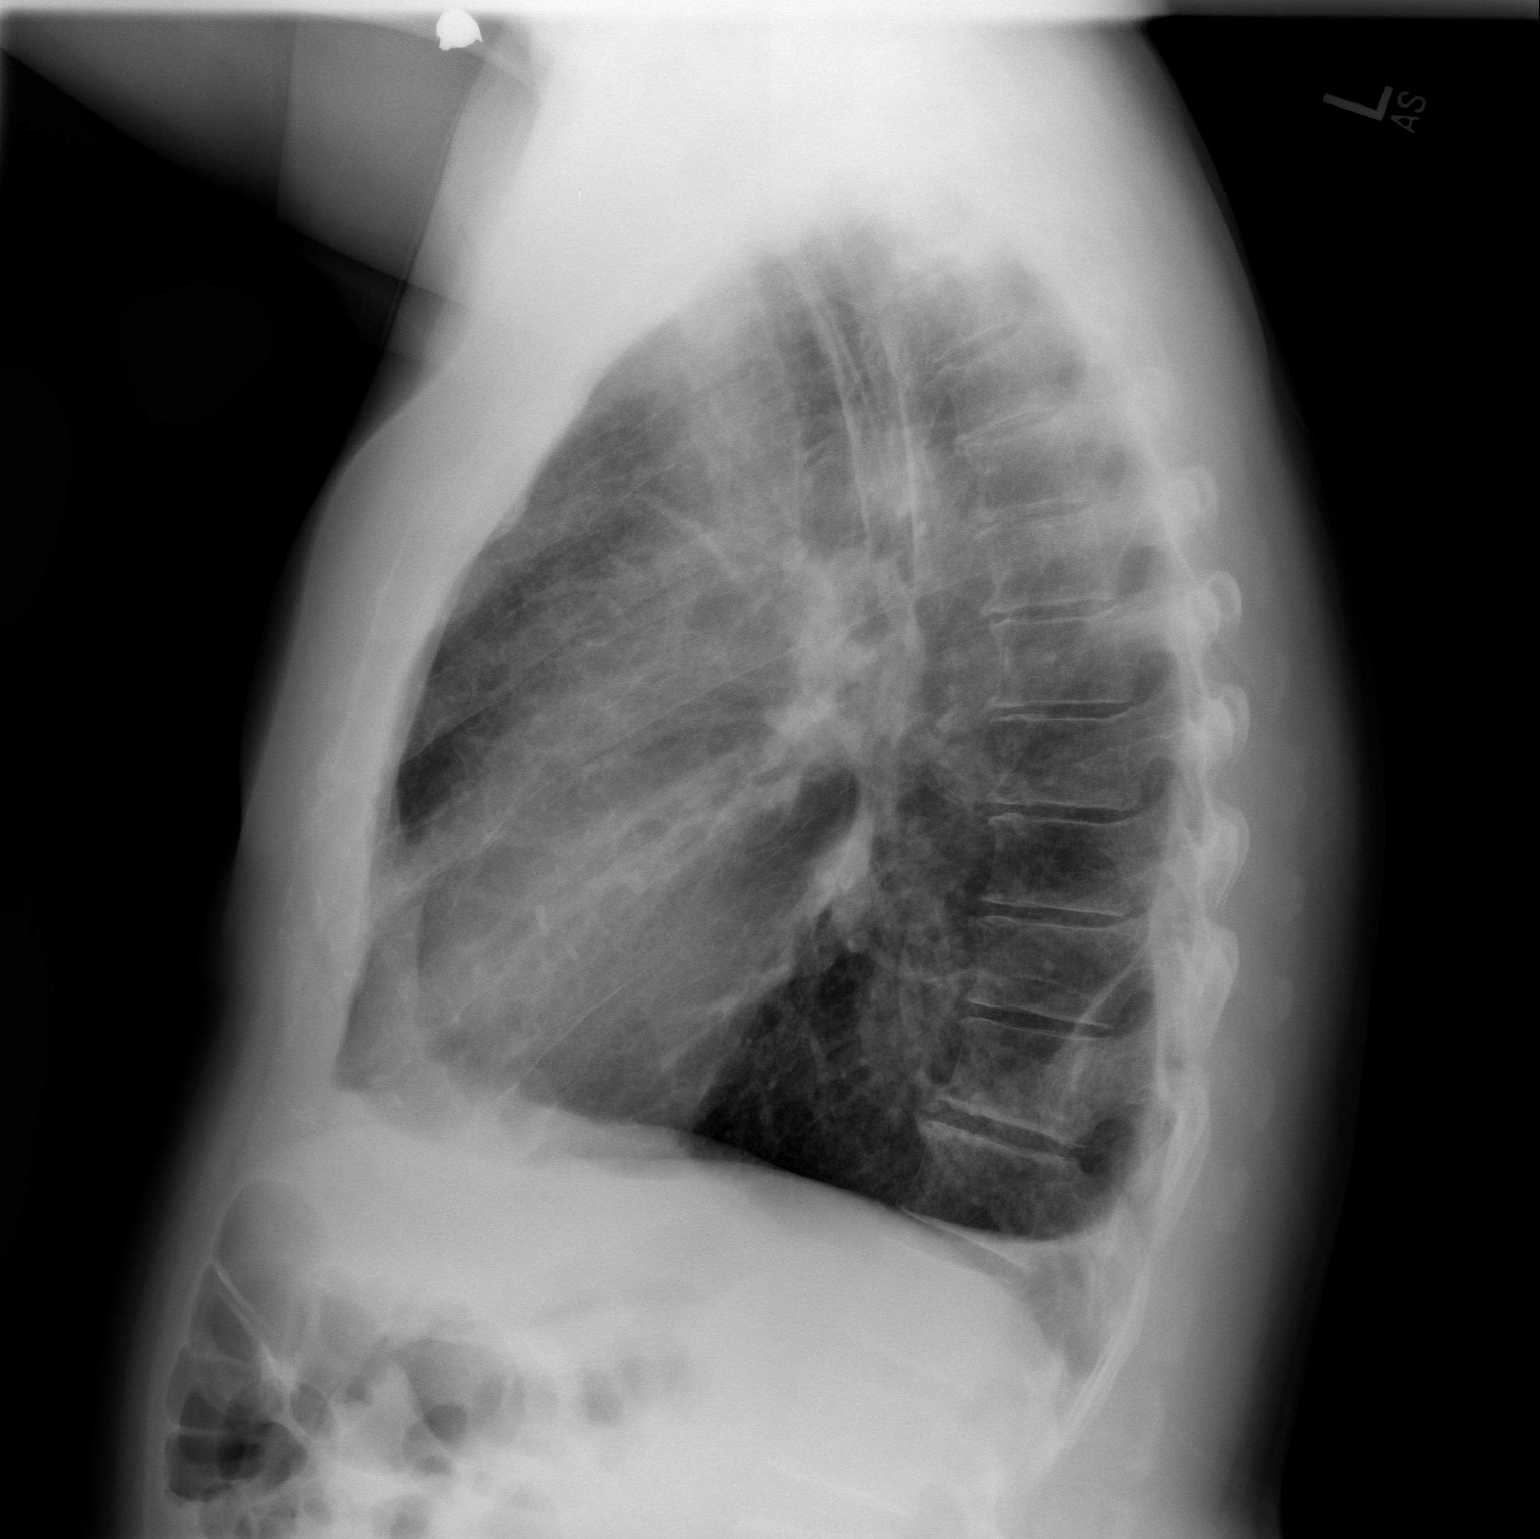

[2 of 2 positions shown; findings below may reference images not displayed]

FINDINGS: The cardiomediastinal silhouette is unremarkable.
COPD/emphysema identified.
Chronic pulmonary opacities are again identified with biapical
pleuroparenchymal scarring/right greater than left.
A small right pleural effusion is noted.
There is no evidence of airspace disease, pneumothorax, pulmonary
edema or pulmonary nodules/mass.
No acute bony abnormalities are noted.
IMPRESSION: Small right pleural effusion without other acute abnormality.

COPD/emphysema.

## 2014-05-13 ENCOUNTER — Ambulatory Visit: Payer: Medicaid Other | Admitting: Pulmonary Disease

## 2014-05-20 ENCOUNTER — Inpatient Hospital Stay: Payer: Self-pay | Admitting: Internal Medicine

## 2014-05-20 LAB — BASIC METABOLIC PANEL
Anion Gap: 8 (ref 7–16)
BUN: 11 mg/dL (ref 7–18)
CREATININE: 0.92 mg/dL (ref 0.60–1.30)
Calcium, Total: 8.4 mg/dL — ABNORMAL LOW (ref 8.5–10.1)
Chloride: 105 mmol/L (ref 98–107)
Co2: 29 mmol/L (ref 21–32)
GLUCOSE: 123 mg/dL — AB (ref 65–99)
OSMOLALITY: 284 (ref 275–301)
POTASSIUM: 3.6 mmol/L (ref 3.5–5.1)
SODIUM: 142 mmol/L (ref 136–145)

## 2014-05-20 LAB — CBC
HCT: 47.7 % (ref 40.0–52.0)
HGB: 15.3 g/dL (ref 13.0–18.0)
MCH: 30.1 pg (ref 26.0–34.0)
MCHC: 32.2 g/dL (ref 32.0–36.0)
MCV: 94 fL (ref 80–100)
PLATELETS: 187 10*3/uL (ref 150–440)
RBC: 5.09 10*6/uL (ref 4.40–5.90)
RDW: 14.5 % (ref 11.5–14.5)
WBC: 6.6 10*3/uL (ref 3.8–10.6)

## 2014-05-20 LAB — PRO B NATRIURETIC PEPTIDE: B-Type Natriuretic Peptide: 45 pg/mL (ref 0–125)

## 2014-05-20 LAB — PROTIME-INR
INR: 1
Prothrombin Time: 13.1 secs (ref 11.5–14.7)

## 2014-05-20 LAB — TROPONIN I
Troponin-I: <0.02 ng/mL
Troponin-I: <0.02 ng/mL

## 2014-05-20 LAB — D-DIMER(ARMC): D-Dimer: 1640 ng/ml

## 2014-05-21 LAB — CBC WITH DIFFERENTIAL/PLATELET
Basophil #: 0 10*3/uL (ref 0.0–0.1)
Basophil %: 0.6 %
Eosinophil #: 0.1 10*3/uL (ref 0.0–0.7)
Eosinophil %: 2.2 %
HCT: 39.7 % — AB (ref 40.0–52.0)
HGB: 13.2 g/dL (ref 13.0–18.0)
LYMPHS ABS: 2 10*3/uL (ref 1.0–3.6)
Lymphocyte %: 34.3 %
MCH: 31.2 pg (ref 26.0–34.0)
MCHC: 33.3 g/dL (ref 32.0–36.0)
MCV: 94 fL (ref 80–100)
MONOS PCT: 8 %
Monocyte #: 0.5 x10 3/mm (ref 0.2–1.0)
Neutrophil #: 3.1 10*3/uL (ref 1.4–6.5)
Neutrophil %: 54.9 %
Platelet: 143 10*3/uL — ABNORMAL LOW (ref 150–440)
RBC: 4.24 10*6/uL — ABNORMAL LOW (ref 4.40–5.90)
RDW: 14.4 % (ref 11.5–14.5)
WBC: 5.7 10*3/uL (ref 3.8–10.6)

## 2014-05-21 LAB — LIPID PANEL
CHOLESTEROL: 125 mg/dL (ref 0–200)
HDL Cholesterol: 31 mg/dL — ABNORMAL LOW (ref 40–60)
LDL CHOLESTEROL, CALC: 61 mg/dL (ref 0–100)
TRIGLYCERIDES: 163 mg/dL (ref 0–200)
VLDL CHOLESTEROL, CALC: 33 mg/dL (ref 5–40)

## 2014-05-21 LAB — BASIC METABOLIC PANEL
Anion Gap: 6 — ABNORMAL LOW (ref 7–16)
BUN: 11 mg/dL (ref 7–18)
CHLORIDE: 107 mmol/L (ref 98–107)
Calcium, Total: 7.6 mg/dL — ABNORMAL LOW (ref 8.5–10.1)
Co2: 29 mmol/L (ref 21–32)
Creatinine: 1.13 mg/dL (ref 0.60–1.30)
EGFR (African American): >60
EGFR (Non-African Amer.): >60
GLUCOSE: 117 mg/dL — AB (ref 65–99)
OSMOLALITY: 284 (ref 275–301)
Potassium: 3.7 mmol/L (ref 3.5–5.1)
Sodium: 142 mmol/L (ref 136–145)

## 2014-05-21 LAB — TSH: THYROID STIMULATING HORM: 1.81 u[IU]/mL

## 2014-05-25 LAB — PLATELET COUNT: Platelet: 155 10*3/uL (ref 150–440)

## 2014-05-29 ENCOUNTER — Emergency Department: Payer: Self-pay | Admitting: Emergency Medicine

## 2014-05-29 LAB — COMPREHENSIVE METABOLIC PANEL
ALT: 31 U/L
AST: 24 U/L (ref 15–37)
Albumin: 3.2 g/dL — ABNORMAL LOW (ref 3.4–5.0)
Alkaline Phosphatase: 102 U/L
Anion Gap: 12 (ref 7–16)
BILIRUBIN TOTAL: 0.4 mg/dL (ref 0.2–1.0)
BUN: 30 mg/dL — AB (ref 7–18)
CALCIUM: 8.2 mg/dL — AB (ref 8.5–10.1)
CHLORIDE: 97 mmol/L — AB (ref 98–107)
CO2: 27 mmol/L (ref 21–32)
Creatinine: 1.11 mg/dL (ref 0.60–1.30)
EGFR (African American): >60
EGFR (Non-African Amer.): >60
Glucose: 134 mg/dL — ABNORMAL HIGH (ref 65–99)
Osmolality: 280 (ref 275–301)
Potassium: 3.8 mmol/L (ref 3.5–5.1)
Sodium: 136 mmol/L (ref 136–145)
TOTAL PROTEIN: 6.9 g/dL (ref 6.4–8.2)

## 2014-05-29 LAB — CBC WITH DIFFERENTIAL/PLATELET
BASOS ABS: 0.5 10*3/uL — AB (ref 0.0–0.1)
BASOS PCT: 4.7 %
EOS ABS: 0.1 10*3/uL (ref 0.0–0.7)
EOS PCT: 0.5 %
HCT: 50.1 % (ref 40.0–52.0)
HGB: 16.6 g/dL (ref 13.0–18.0)
Lymphocyte #: 1 10*3/uL (ref 1.0–3.6)
Lymphocyte %: 9.2 %
MCH: 30.6 pg (ref 26.0–34.0)
MCHC: 33 g/dL (ref 32.0–36.0)
MCV: 93 fL (ref 80–100)
MONO ABS: 0.9 x10 3/mm (ref 0.2–1.0)
MONOS PCT: 8.1 %
NEUTROS ABS: 8.5 10*3/uL — AB (ref 1.4–6.5)
NEUTROS PCT: 77.5 %
Platelet: 192 10*3/uL (ref 150–440)
RBC: 5.4 10*6/uL (ref 4.40–5.90)
RDW: 14.4 % (ref 11.5–14.5)
WBC: 10.9 10*3/uL — ABNORMAL HIGH (ref 3.8–10.6)

## 2014-05-29 LAB — DRUG SCREEN, URINE

## 2014-05-29 LAB — SALICYLATE LEVEL: SALICYLATES, SERUM: 2.2 mg/dL

## 2014-05-29 LAB — ACETAMINOPHEN LEVEL

## 2014-05-29 LAB — ETHANOL

## 2014-05-29 LAB — LIPASE, BLOOD: Lipase: 113 U/L (ref 73–393)

## 2014-05-30 LAB — TROPONIN I: Troponin-I: <0.02 ng/mL

## 2014-06-17 ENCOUNTER — Emergency Department: Payer: Self-pay | Admitting: Emergency Medicine

## 2014-06-17 LAB — CBC
HCT: 49.9 % (ref 40.0–52.0)
HGB: 16.1 g/dL (ref 13.0–18.0)
MCH: 29.9 pg (ref 26.0–34.0)
MCHC: 32.4 g/dL (ref 32.0–36.0)
MCV: 93 fL (ref 80–100)
Platelet: 322 10*3/uL (ref 150–440)
RBC: 5.4 10*6/uL (ref 4.40–5.90)
RDW: 14.6 % — ABNORMAL HIGH (ref 11.5–14.5)
WBC: 8.6 10*3/uL (ref 3.8–10.6)

## 2014-06-17 LAB — COMPREHENSIVE METABOLIC PANEL
ALT: 41 U/L
Albumin: 4.2 g/dL (ref 3.4–5.0)
Alkaline Phosphatase: 118 U/L — ABNORMAL HIGH
Anion Gap: 10 (ref 7–16)
BUN: 18 mg/dL (ref 7–18)
Bilirubin,Total: 0.3 mg/dL (ref 0.2–1.0)
CO2: 25 mmol/L (ref 21–32)
Calcium, Total: 8.7 mg/dL (ref 8.5–10.1)
Chloride: 104 mmol/L (ref 98–107)
Creatinine: 1.22 mg/dL (ref 0.60–1.30)
EGFR (African American): >60
EGFR (Non-African Amer.): >60
Glucose: 105 mg/dL — ABNORMAL HIGH (ref 65–99)
Osmolality: 280 (ref 275–301)
Potassium: 3.9 mmol/L (ref 3.5–5.1)
SGOT(AST): 39 U/L — ABNORMAL HIGH (ref 15–37)
Sodium: 139 mmol/L (ref 136–145)
Total Protein: 8.3 g/dL — ABNORMAL HIGH (ref 6.4–8.2)

## 2014-06-17 LAB — URINALYSIS, COMPLETE
BACTERIA: NONE SEEN
Bilirubin,UR: NEGATIVE
Blood: NEGATIVE
GLUCOSE, UR: NEGATIVE mg/dL (ref 0–75)
Hyaline Cast: 2
Ketone: NEGATIVE
Leukocyte Esterase: NEGATIVE
Nitrite: NEGATIVE
PH: 5 (ref 4.5–8.0)
Protein: NEGATIVE
RBC,UR: 1 /HPF (ref 0–5)
Specific Gravity: 1.011 (ref 1.003–1.030)
Squamous Epithelial: NONE SEEN
WBC UR: 1 /HPF (ref 0–5)

## 2014-06-17 LAB — DRUG SCREEN, URINE
Amphetamines, Ur Screen: NEGATIVE (ref ?–1000)
Barbiturates, Ur Screen: NEGATIVE (ref ?–200)
Benzodiazepine, Ur Scrn: NEGATIVE (ref ?–200)
CANNABINOID 50 NG, UR ~~LOC~~: NEGATIVE (ref ?–50)
COCAINE METABOLITE, UR ~~LOC~~: NEGATIVE (ref ?–300)
MDMA (ECSTASY) UR SCREEN: NEGATIVE (ref ?–500)
Methadone, Ur Screen: NEGATIVE (ref ?–300)
Opiate, Ur Screen: POSITIVE (ref ?–300)
Phencyclidine (PCP) Ur S: NEGATIVE (ref ?–25)
Tricyclic, Ur Screen: NEGATIVE (ref ?–1000)

## 2014-06-17 LAB — SALICYLATE LEVEL: Salicylates, Serum: <1.7 mg/dL

## 2014-06-17 LAB — ETHANOL: ETHANOL LVL: 68 mg/dL

## 2014-06-17 LAB — ACETAMINOPHEN LEVEL: Acetaminophen: 15 ug/mL

## 2014-06-22 ENCOUNTER — Emergency Department (HOSPITAL_COMMUNITY)
Admission: EM | Admit: 2014-06-22 | Discharge: 2014-06-23 | Disposition: A | Discharge status: Other Acute Inpatient Hospital | Payer: Medicaid Other | Attending: Emergency Medicine | Admitting: Emergency Medicine

## 2014-06-22 ENCOUNTER — Encounter (HOSPITAL_COMMUNITY): Payer: Self-pay | Admitting: *Deleted

## 2014-06-22 DIAGNOSIS — Z880 Allergy status to penicillin: Secondary | ICD-10-CM | POA: Insufficient documentation

## 2014-06-22 DIAGNOSIS — G8929 Other chronic pain: Secondary | ICD-10-CM | POA: Diagnosis not present

## 2014-06-22 DIAGNOSIS — Z87828 Personal history of other (healed) physical injury and trauma: Secondary | ICD-10-CM | POA: Insufficient documentation

## 2014-06-22 DIAGNOSIS — R45851 Suicidal ideations: Secondary | ICD-10-CM | POA: Diagnosis not present

## 2014-06-22 DIAGNOSIS — Z79899 Other long term (current) drug therapy: Secondary | ICD-10-CM | POA: Insufficient documentation

## 2014-06-22 DIAGNOSIS — M545 Low back pain: Secondary | ICD-10-CM | POA: Insufficient documentation

## 2014-06-22 DIAGNOSIS — Z7951 Long term (current) use of inhaled steroids: Secondary | ICD-10-CM | POA: Diagnosis not present

## 2014-06-22 DIAGNOSIS — R000 Tachycardia, unspecified: Secondary | ICD-10-CM | POA: Insufficient documentation

## 2014-06-22 DIAGNOSIS — J449 Chronic obstructive pulmonary disease, unspecified: Secondary | ICD-10-CM | POA: Diagnosis not present

## 2014-06-22 DIAGNOSIS — F1010 Alcohol abuse, uncomplicated: Secondary | ICD-10-CM

## 2014-06-22 DIAGNOSIS — Z765 Malingerer [conscious simulation]: Secondary | ICD-10-CM | POA: Diagnosis not present

## 2014-06-22 DIAGNOSIS — F322 Major depressive disorder, single episode, severe without psychotic features: Secondary | ICD-10-CM

## 2014-06-22 DIAGNOSIS — F329 Major depressive disorder, single episode, unspecified: Secondary | ICD-10-CM | POA: Diagnosis present

## 2014-06-22 DIAGNOSIS — F10230 Alcohol dependence with withdrawal, uncomplicated: Secondary | ICD-10-CM | POA: Diagnosis present

## 2014-06-22 LAB — ETHANOL: Alcohol, Ethyl (B): 73 mg/dL — ABNORMAL HIGH (ref 0–9)

## 2014-06-22 LAB — COMPREHENSIVE METABOLIC PANEL
ALT: 26 U/L (ref 0–53)
AST: 28 U/L (ref 0–37)
Albumin: 4.5 g/dL (ref 3.5–5.2)
Alkaline Phosphatase: 106 U/L (ref 39–117)
Anion gap: 9 (ref 5–15)
BUN: 14 mg/dL (ref 6–23)
CO2: 27 mmol/L (ref 19–32)
Calcium: 9.1 mg/dL (ref 8.4–10.5)
Chloride: 101 mEq/L (ref 96–112)
Creatinine, Ser: 1.27 mg/dL (ref 0.50–1.35)
GFR calc Af Amer: 70 mL/min — ABNORMAL LOW (ref >90)
GFR calc non Af Amer: 60 mL/min — ABNORMAL LOW (ref >90)
Glucose, Bld: 149 mg/dL — ABNORMAL HIGH (ref 70–99)
Potassium: 4 mmol/L (ref 3.5–5.1)
Sodium: 137 mmol/L (ref 135–145)
Total Bilirubin: 0.4 mg/dL (ref 0.3–1.2)
Total Protein: 7.6 g/dL (ref 6.0–8.3)

## 2014-06-22 LAB — CBC
HCT: 46.8 % (ref 39.0–52.0)
Hemoglobin: 15.6 g/dL (ref 13.0–17.0)
MCH: 30.5 pg (ref 26.0–34.0)
MCHC: 33.3 g/dL (ref 30.0–36.0)
MCV: 91.6 fL (ref 78.0–100.0)
Platelets: 221 10*3/uL (ref 150–400)
RBC: 5.11 MIL/uL (ref 4.22–5.81)
RDW: 13.6 % (ref 11.5–15.5)
WBC: 7 10*3/uL (ref 4.0–10.5)

## 2014-06-22 LAB — SALICYLATE LEVEL: Salicylate Lvl: <4 mg/dL (ref 2.8–20.0)

## 2014-06-22 LAB — ACETAMINOPHEN LEVEL: Acetaminophen (Tylenol), Serum: <10 ug/mL — ABNORMAL LOW (ref 10–30)

## 2014-06-22 MED ORDER — ONDANSETRON HCL 4 MG PO TABS: 4.0000 mg | ORAL_TABLET | Freq: Three times a day (TID) | ORAL | Status: DC | PRN

## 2014-06-22 MED ORDER — ALBUTEROL SULFATE (2.5 MG/3ML) 0.083% IN NEBU
2.5000 mg | INHALATION_SOLUTION | Freq: Four times a day (QID) | RESPIRATORY_TRACT | Status: DC | PRN
  Administered 2014-06-23 (×2): 2.5 mg via RESPIRATORY_TRACT
  Filled 2014-06-22 (×2): qty 3

## 2014-06-22 MED ORDER — GABAPENTIN 600 MG PO TABS: 600.0000 mg | ORAL_TABLET | Freq: Three times a day (TID) | ORAL | Status: DC

## 2014-06-22 MED ORDER — MULTIVITAMINS PO CAPS: 1.0000 | ORAL_CAPSULE | Freq: Every day | ORAL | Status: DC

## 2014-06-22 MED ORDER — THIAMINE HCL 100 MG/ML IJ SOLN: 100.0000 mg | Freq: Every day | INTRAMUSCULAR | Status: DC

## 2014-06-22 MED ORDER — IBUPROFEN 200 MG PO TABS
400.0000 mg | ORAL_TABLET | Freq: Three times a day (TID) | ORAL | Status: DC | PRN
  Administered 2014-06-23: 400 mg via ORAL
  Filled 2014-06-22: qty 2

## 2014-06-22 MED ORDER — LORAZEPAM 1 MG PO TABS: 0.0000 mg | ORAL_TABLET | Freq: Two times a day (BID) | ORAL | Status: DC

## 2014-06-22 MED ORDER — ALBUTEROL SULFATE (5 MG/ML) 0.5% IN NEBU: 2.5000 mg | INHALATION_SOLUTION | Freq: Four times a day (QID) | RESPIRATORY_TRACT | Status: DC | PRN

## 2014-06-22 MED ORDER — ACLIDINIUM BROMIDE 400 MCG/ACT IN AEPB: 1.0000 | INHALATION_SPRAY | Freq: Two times a day (BID) | RESPIRATORY_TRACT | Status: DC

## 2014-06-22 MED ORDER — FLUOXETINE HCL 20 MG PO CAPS
40.0000 mg | ORAL_CAPSULE | Freq: Every day | ORAL | Status: DC
  Administered 2014-06-23: 40 mg via ORAL
  Filled 2014-06-22: qty 2

## 2014-06-22 MED ORDER — PREGABALIN 25 MG PO CAPS
25.0000 mg | ORAL_CAPSULE | Freq: Four times a day (QID) | ORAL | Status: DC
  Administered 2014-06-22 – 2014-06-23 (×3): 25 mg via ORAL
  Filled 2014-06-22 (×3): qty 1

## 2014-06-22 MED ORDER — BUDESONIDE 0.25 MG/2ML IN SUSP: 0.2500 mg | Freq: Four times a day (QID) | RESPIRATORY_TRACT | Status: DC

## 2014-06-22 MED ORDER — OXYCODONE HCL ER 10 MG PO T12A
20.0000 mg | EXTENDED_RELEASE_TABLET | Freq: Two times a day (BID) | ORAL | Status: DC
  Administered 2014-06-22 – 2014-06-23 (×2): 20 mg via ORAL
  Filled 2014-06-22 (×2): qty 2

## 2014-06-22 MED ORDER — TIOTROPIUM BROMIDE MONOHYDRATE 18 MCG IN CAPS
18.0000 ug | ORAL_CAPSULE | Freq: Every day | RESPIRATORY_TRACT | Status: DC
  Filled 2014-06-22: qty 5

## 2014-06-22 MED ORDER — OXYCODONE HCL 5 MG PO TABS
15.0000 mg | ORAL_TABLET | Freq: Four times a day (QID) | ORAL | Status: DC | PRN
Start: 2014-06-22 — End: 2014-06-23
  Administered 2014-06-22 – 2014-06-23 (×3): 15 mg via ORAL
  Filled 2014-06-22 (×3): qty 3

## 2014-06-22 MED ORDER — ARFORMOTEROL TARTRATE 15 MCG/2ML IN NEBU
15.0000 ug | INHALATION_SOLUTION | Freq: Two times a day (BID) | RESPIRATORY_TRACT | Status: DC
  Administered 2014-06-22 – 2014-06-23 (×2): 15 ug via RESPIRATORY_TRACT
  Filled 2014-06-22 (×5): qty 2

## 2014-06-22 MED ORDER — IPRATROPIUM-ALBUTEROL 18-103 MCG/ACT IN AERO
1.0000 | INHALATION_SPRAY | Freq: Two times a day (BID) | RESPIRATORY_TRACT | Status: DC
  Filled 2014-06-22: qty 14.7

## 2014-06-22 MED ORDER — ADULT MULTIVITAMIN W/MINERALS CH
1.0000 | ORAL_TABLET | Freq: Every day | ORAL | Status: DC
  Administered 2014-06-23: 1 via ORAL
  Filled 2014-06-22: qty 1

## 2014-06-22 MED ORDER — BUDESONIDE 0.25 MG/2ML IN SUSP
0.2500 mg | Freq: Four times a day (QID) | RESPIRATORY_TRACT | Status: DC
  Administered 2014-06-22 – 2014-06-23 (×4): 0.25 mg via RESPIRATORY_TRACT
  Filled 2014-06-22 (×4): qty 2

## 2014-06-22 MED ORDER — OXYCODONE HCL 5 MG PO TABS
5.0000 mg | ORAL_TABLET | Freq: Three times a day (TID) | ORAL | Status: DC | PRN
  Filled 2014-06-22: qty 1

## 2014-06-22 MED ORDER — TRAZODONE HCL 100 MG PO TABS
200.0000 mg | ORAL_TABLET | Freq: Every day | ORAL | Status: DC
  Administered 2014-06-22: 200 mg via ORAL
  Filled 2014-06-22: qty 2

## 2014-06-22 MED ORDER — VITAMIN B-1 100 MG PO TABS
100.0000 mg | ORAL_TABLET | Freq: Every day | ORAL | Status: DC
  Administered 2014-06-23: 100 mg via ORAL
  Filled 2014-06-22: qty 1

## 2014-06-22 MED ORDER — IPRATROPIUM-ALBUTEROL 20-100 MCG/ACT IN AERS
1.0000 | INHALATION_SPRAY | Freq: Two times a day (BID) | RESPIRATORY_TRACT | Status: DC
  Administered 2014-06-22 – 2014-06-23 (×2): 1 via RESPIRATORY_TRACT
  Filled 2014-06-22: qty 4

## 2014-06-22 MED ORDER — LORAZEPAM 1 MG PO TABS
0.0000 mg | ORAL_TABLET | Freq: Four times a day (QID) | ORAL | Status: DC
  Administered 2014-06-22: 2 mg via ORAL
  Administered 2014-06-22: 1 mg via ORAL
  Administered 2014-06-23 (×2): 2 mg via ORAL
  Filled 2014-06-22: qty 1
  Filled 2014-06-22 (×3): qty 2

## 2014-06-22 NOTE — ED Notes (Signed)
Pt reports passive SI, does not want to kill himself or harm himself, but wants to die, prays God will take him. Denies HI, AH/VH. Reports ETOH abuse x5 years, 2 quarts per day. Last ETOH at 1600.  Reports chronic pain issue, pain meds have not been helping so pt takes over prescribed amount at times. Reports Dr Earline Mayotte said pt had 2 tumors on pituitary glands, was suppose to get scans every 6 months, has not had scan in 2 years. Reports headache and back pain 8/10.
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
## 2014-06-22 NOTE — BH Assessment (Signed)
Assessment complete. Lavell Luster, [facility], confirmed adult unit is at capacity. Gave clinical report to Dr. Gabriel Earing who said Pt meets criteria for inpatient dual-diagnosis treatment. TTS will contact other facilities for placement. Notified Dr. Eugenio Hoes and TCU staff of recommendation.  Orpah Greek Rosana Hoes, [facility] Triage Specialist [phone]

## 2014-06-22 NOTE — BH Assessment (Signed)
Contacted technical assist at [phone] and the agent said the ticket for service needs to be initiated by staff at [facility]. Contacted Jillene Bucks, TTS at [facility], and she said she would try to fix the tele-cart and if it does not work submit the ticket.  Orpah Greek Rosana Hoes, [facility] Triage Specialist [phone]

## 2014-06-22 NOTE — ED Notes (Signed)
Bed: WA28 Expected date:  Expected time:  Means of arrival:  Comments: 

## 2014-06-22 NOTE — BH Assessment (Signed)
Received notification of assessment in shift report. Spoke to Virgel Manifold, MD who said Pt is abusing alcohol and reports suicidal ideation with no plan. Tele-assessment will be initiated.  Orpah Greek Rosana Hoes, [facility] Triage Specialist [phone]
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
## 2014-06-22 NOTE — ED Notes (Signed)
Bed: WA30 Expected date:  Expected time:  Means of arrival:  Comments: 

## 2014-06-22 NOTE — BH Assessment (Signed)
Multiple attempts to connect to [place] without success. TTS will contact technical support for assistance and notify counselor stationed at [facility] of situation.  Orpah Greek Rosana Hoes, [facility] Triage Specialist [phone]

## 2014-06-22 NOTE — ED Notes (Signed)
Dr Wilson Singer in to assess pt, Pt resting at present with family member present.

## 2014-06-23 ENCOUNTER — Inpatient Hospital Stay (HOSPITAL_COMMUNITY)
Admission: AD | Admit: 2014-06-23 | Discharge: 2014-07-02 | DRG: 885 | Disposition: A | Discharge status: Home / Self Care | Payer: Medicaid Other | Source: Intra-hospital | Attending: Psychiatry | Admitting: Psychiatry

## 2014-06-23 ENCOUNTER — Encounter (HOSPITAL_COMMUNITY): Payer: Self-pay | Admitting: General Practice

## 2014-06-23 DIAGNOSIS — G8929 Other chronic pain: Secondary | ICD-10-CM | POA: Diagnosis present

## 2014-06-23 DIAGNOSIS — F411 Generalized anxiety disorder: Secondary | ICD-10-CM | POA: Diagnosis present

## 2014-06-23 DIAGNOSIS — J45909 Unspecified asthma, uncomplicated: Secondary | ICD-10-CM | POA: Diagnosis present

## 2014-06-23 DIAGNOSIS — Z79891 Long term (current) use of opiate analgesic: Secondary | ICD-10-CM | POA: Diagnosis not present

## 2014-06-23 DIAGNOSIS — R45851 Suicidal ideations: Secondary | ICD-10-CM

## 2014-06-23 DIAGNOSIS — F4310 Post-traumatic stress disorder, unspecified: Secondary | ICD-10-CM | POA: Diagnosis present

## 2014-06-23 DIAGNOSIS — Z87891 Personal history of nicotine dependence: Secondary | ICD-10-CM | POA: Diagnosis not present

## 2014-06-23 DIAGNOSIS — F10239 Alcohol dependence with withdrawal, unspecified: Secondary | ICD-10-CM | POA: Diagnosis present

## 2014-06-23 DIAGNOSIS — Z599 Problem related to housing and economic circumstances, unspecified: Secondary | ICD-10-CM

## 2014-06-23 DIAGNOSIS — G4700 Insomnia, unspecified: Secondary | ICD-10-CM | POA: Diagnosis present

## 2014-06-23 DIAGNOSIS — F332 Major depressive disorder, recurrent severe without psychotic features: Secondary | ICD-10-CM | POA: Diagnosis present

## 2014-06-23 DIAGNOSIS — Z791 Long term (current) use of non-steroidal anti-inflammatories (NSAID): Secondary | ICD-10-CM | POA: Diagnosis not present

## 2014-06-23 DIAGNOSIS — Z609 Problem related to social environment, unspecified: Secondary | ICD-10-CM | POA: Diagnosis present

## 2014-06-23 DIAGNOSIS — J449 Chronic obstructive pulmonary disease, unspecified: Secondary | ICD-10-CM | POA: Diagnosis present

## 2014-06-23 DIAGNOSIS — Z825 Family history of asthma and other chronic lower respiratory diseases: Secondary | ICD-10-CM

## 2014-06-23 DIAGNOSIS — Z9119 Patient's noncompliance with other medical treatment and regimen: Secondary | ICD-10-CM | POA: Diagnosis present

## 2014-06-23 DIAGNOSIS — Z79899 Other long term (current) drug therapy: Secondary | ICD-10-CM

## 2014-06-23 DIAGNOSIS — F329 Major depressive disorder, single episode, unspecified: Secondary | ICD-10-CM | POA: Diagnosis present

## 2014-06-23 DIAGNOSIS — F10230 Alcohol dependence with withdrawal, uncomplicated: Secondary | ICD-10-CM | POA: Insufficient documentation

## 2014-06-23 DIAGNOSIS — F1020 Alcohol dependence, uncomplicated: Secondary | ICD-10-CM | POA: Diagnosis present

## 2014-06-23 DIAGNOSIS — Z800 Family history of malignant neoplasm of digestive organs: Secondary | ICD-10-CM | POA: Diagnosis not present

## 2014-06-23 DIAGNOSIS — F322 Major depressive disorder, single episode, severe without psychotic features: Secondary | ICD-10-CM | POA: Diagnosis present

## 2014-06-23 LAB — RAPID URINE DRUG SCREEN, HOSP PERFORMED
Amphetamines: NOT DETECTED
Barbiturates: NOT DETECTED
Benzodiazepines: POSITIVE — AB
Cocaine: NOT DETECTED
Opiates: NOT DETECTED
Tetrahydrocannabinol: NOT DETECTED

## 2014-06-23 MED ORDER — ONDANSETRON HCL 4 MG PO TABS: 4.0000 mg | ORAL_TABLET | Freq: Three times a day (TID) | ORAL | Status: DC | PRN

## 2014-06-23 MED ORDER — ARFORMOTEROL TARTRATE 15 MCG/2ML IN NEBU
15.0000 ug | INHALATION_SOLUTION | Freq: Two times a day (BID) | RESPIRATORY_TRACT | Status: DC
  Administered 2014-06-23 – 2014-06-24 (×2): 15 ug via RESPIRATORY_TRACT
  Filled 2014-06-23 (×10): qty 2

## 2014-06-23 MED ORDER — IPRATROPIUM-ALBUTEROL 20-100 MCG/ACT IN AERS
1.0000 | INHALATION_SPRAY | Freq: Two times a day (BID) | RESPIRATORY_TRACT | Status: DC
  Administered 2014-06-23 – 2014-07-02 (×18): 1 via RESPIRATORY_TRACT
  Filled 2014-06-23 (×2): qty 4

## 2014-06-23 MED ORDER — ACLIDINIUM BROMIDE 400 MCG/ACT IN AEPB
1.0000 | INHALATION_SPRAY | Freq: Two times a day (BID) | RESPIRATORY_TRACT | Status: DC
  Administered 2014-06-23 – 2014-07-02 (×14): 1 via RESPIRATORY_TRACT

## 2014-06-23 MED ORDER — NICOTINE 14 MG/24HR TD PT24
14.0000 mg | MEDICATED_PATCH | Freq: Every day | TRANSDERMAL | Status: DC
  Administered 2014-06-24 – 2014-07-01 (×8): 14 mg via TRANSDERMAL
  Filled 2014-06-23 (×11): qty 1

## 2014-06-23 MED ORDER — VITAMIN B-1 100 MG PO TABS
100.0000 mg | ORAL_TABLET | Freq: Every day | ORAL | Status: DC
  Filled 2014-06-23 (×2): qty 1

## 2014-06-23 MED ORDER — ADULT MULTIVITAMIN W/MINERALS CH
1.0000 | ORAL_TABLET | Freq: Every day | ORAL | Status: DC
  Filled 2014-06-23 (×2): qty 1

## 2014-06-23 MED ORDER — PREGABALIN 25 MG PO CAPS
25.0000 mg | ORAL_CAPSULE | Freq: Four times a day (QID) | ORAL | Status: DC
  Administered 2014-06-23 – 2014-06-26 (×11): 25 mg via ORAL
  Filled 2014-06-23 (×11): qty 1

## 2014-06-23 MED ORDER — ALUM & MAG HYDROXIDE-SIMETH 200-200-20 MG/5ML PO SUSP: 30.0000 mL | ORAL | Status: DC | PRN

## 2014-06-23 MED ORDER — LORAZEPAM 1 MG PO TABS: 0.0000 mg | ORAL_TABLET | Freq: Two times a day (BID) | ORAL | Status: DC

## 2014-06-23 MED ORDER — THIAMINE HCL 100 MG/ML IJ SOLN: 100.0000 mg | Freq: Every day | INTRAMUSCULAR | Status: DC

## 2014-06-23 MED ORDER — FLUOXETINE HCL 20 MG PO CAPS
40.0000 mg | ORAL_CAPSULE | Freq: Every day | ORAL | Status: DC
  Administered 2014-06-24 – 2014-07-02 (×9): 40 mg via ORAL
  Filled 2014-06-23: qty 8
  Filled 2014-06-23 (×10): qty 2

## 2014-06-23 MED ORDER — LORAZEPAM 1 MG PO TABS
0.0000 mg | ORAL_TABLET | Freq: Four times a day (QID) | ORAL | Status: DC
  Administered 2014-06-23: 2 mg via ORAL
  Administered 2014-06-24: 1 mg via ORAL
  Filled 2014-06-23: qty 2
  Filled 2014-06-23: qty 1

## 2014-06-23 MED ORDER — ALBUTEROL SULFATE (2.5 MG/3ML) 0.083% IN NEBU
2.5000 mg | INHALATION_SOLUTION | Freq: Four times a day (QID) | RESPIRATORY_TRACT | Status: DC | PRN
  Administered 2014-06-30: 2.5 mg via RESPIRATORY_TRACT
  Filled 2014-06-23: qty 3

## 2014-06-23 MED ORDER — OXYCODONE HCL ER 20 MG PO T12A
20.0000 mg | EXTENDED_RELEASE_TABLET | Freq: Two times a day (BID) | ORAL | Status: DC
  Administered 2014-06-23 – 2014-06-24 (×2): 20 mg via ORAL
  Filled 2014-06-23 (×2): qty 1

## 2014-06-23 MED ORDER — TRAZODONE HCL 100 MG PO TABS
200.0000 mg | ORAL_TABLET | Freq: Every day | ORAL | Status: DC
  Administered 2014-06-23 – 2014-07-01 (×9): 200 mg via ORAL
  Filled 2014-06-23 (×2): qty 2
  Filled 2014-06-23: qty 8
  Filled 2014-06-23 (×9): qty 2

## 2014-06-23 MED ORDER — NICOTINE 14 MG/24HR TD PT24
14.0000 mg | MEDICATED_PATCH | Freq: Every day | TRANSDERMAL | Status: DC
  Administered 2014-06-23: 14 mg via TRANSDERMAL
  Filled 2014-06-23: qty 1

## 2014-06-23 MED ORDER — BUDESONIDE 0.25 MG/2ML IN SUSP
0.2500 mg | Freq: Four times a day (QID) | RESPIRATORY_TRACT | Status: DC
  Administered 2014-06-23 – 2014-06-24 (×2): 0.25 mg via RESPIRATORY_TRACT
  Filled 2014-06-23 (×17): qty 2

## 2014-06-23 MED ORDER — MAGNESIUM HYDROXIDE 400 MG/5ML PO SUSP: 30.0000 mL | Freq: Every day | ORAL | Status: DC | PRN

## 2014-06-23 MED ORDER — IBUPROFEN 200 MG PO TABS
400.0000 mg | ORAL_TABLET | Freq: Three times a day (TID) | ORAL | Status: DC | PRN
  Administered 2014-06-23: 400 mg via ORAL
  Filled 2014-06-23: qty 2

## 2014-06-23 NOTE — Progress Notes (Signed)
Report given to nurse at [facility] Patient is stable at transfer. Patient was transported by [employer] transport.

## 2014-06-23 NOTE — ED Notes (Signed)
Patient is resting comfortably. 

## 2014-06-23 NOTE — Consult Note (Signed)
  Review of Systems  Constitutional: Positive for malaise/fatigue.  Eyes: Negative.   Respiratory: Negative.   Cardiovascular: Negative.   Gastrointestinal: Negative.   Genitourinary: Negative.   Musculoskeletal: Positive for myalgias and back pain.  Skin: Negative.   Neurological: Positive for sensory change, weakness and headaches.  Endo/Heme/Allergies: Negative.   Psychiatric/Behavioral: Positive for depression and suicidal ideas.

## 2014-06-23 NOTE — BH Assessment (Signed)
Lavell Luster, [facility] at [facility], confirms adult unit is currently at capacity. Contacted the following facilities for placement:  BED AVAILABLE, FAXED CLINICAL INFORMATION: [facility], per [facility], per [facility], per [facility], per [facility]  AT CAPACITY: [facility], per [facility], per [facility], per [facility], per [facility], per [facility], per [facility], per [facility], per [facility], per [facility], per [facility], per [facility], per [facility], per [facility], per [facility] RESPONSE: [facility]   [place], [place], [facility] Triage Specialist [phone]
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
## 2014-06-23 NOTE — Progress Notes (Signed)
Patient ID: Steven NICASIO Sr., male   DOB: 02/08/1956, 58 y.o.   MRN: [id number]  Steven Wiley is a 58 year old male admitted voluntarily to [facility] for MDD and alcohol use. 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 Patient has SI with a plan to shoot self. Patient reports he drinks in order to cope with his pain. Patient is preoccupied with medications during admission. Patient has a past medical history of COPD, neuropathy, chronic back pain, and asthma. Patient denies SI/HI and A/V hallucinations at this time. Patient verbalized understanding of admission process. Patient was oriented to the unit and given hygiene products. Patient also received a booklet on advance directive information. Q15 minute safety checks initiated and are maintained.

## 2014-06-23 NOTE — ED Notes (Signed)
Pt states he would like very much to have inpatient treatment at [facility].

## 2014-06-23 NOTE — Tx Team (Addendum)
Initial Interdisciplinary Treatment Plan   PATIENT STRESSORS: Health problems Substance abuse   PATIENT STRENGTHS: Average or above average intelligence Capable of independent living General fund of knowledge Supportive family/friends   PROBLEM LIST: Problem List/Patient Goals Date to be addressed Date deferred Reason deferred Estimated date of resolution  Substance Abuse 06/23/2014           Chronic Pain 06/23/2014           Depression 06/23/2014           "help with chronic pain"                   DISCHARGE CRITERIA:  Verbal commitment to aftercare and medication compliance Withdrawal symptoms are absent or subacute and managed without 24-hour nursing intervention  PRELIMINARY DISCHARGE PLAN: Attend 12-step recovery group Outpatient therapy  PATIENT/FAMIILY INVOLVEMENT: This treatment plan has been presented to and reviewed with the patient, Steven FASSNACHT Sr..  The patient and family have been given the opportunity to ask questions and make suggestions.  Gaylan Gerold E 06/23/2014, 5:49 PM

## 2014-06-24 ENCOUNTER — Encounter (HOSPITAL_COMMUNITY): Payer: Self-pay | Admitting: Registered Nurse

## 2014-06-24 DIAGNOSIS — F1010 Alcohol abuse, uncomplicated: Secondary | ICD-10-CM | POA: Insufficient documentation

## 2014-06-24 DIAGNOSIS — F4310 Post-traumatic stress disorder, unspecified: Secondary | ICD-10-CM

## 2014-06-24 DIAGNOSIS — F1020 Alcohol dependence, uncomplicated: Secondary | ICD-10-CM | POA: Diagnosis present

## 2014-06-24 DIAGNOSIS — R45851 Suicidal ideations: Secondary | ICD-10-CM

## 2014-06-24 DIAGNOSIS — F332 Major depressive disorder, recurrent severe without psychotic features: Principal | ICD-10-CM

## 2014-06-24 MED ORDER — OXYCODONE HCL 5 MG PO TABS
15.0000 mg | ORAL_TABLET | Freq: Four times a day (QID) | ORAL | Status: DC | PRN
  Administered 2014-06-24 – 2014-07-02 (×16): 15 mg via ORAL
  Filled 2014-06-24 (×16): qty 3

## 2014-06-24 MED ORDER — HYDROXYZINE HCL 25 MG PO TABS
25.0000 mg | ORAL_TABLET | Freq: Four times a day (QID) | ORAL | Status: AC | PRN
Start: 2014-06-24 — End: 2014-06-27
  Administered 2014-06-26: 25 mg via ORAL
  Filled 2014-06-24: qty 1

## 2014-06-24 MED ORDER — LOPERAMIDE HCL 2 MG PO CAPS: 2.0000 mg | ORAL_CAPSULE | ORAL | Status: AC | PRN

## 2014-06-24 MED ORDER — LORAZEPAM 1 MG PO TABS
1.0000 mg | ORAL_TABLET | Freq: Four times a day (QID) | ORAL | Status: DC
  Administered 2014-06-24 (×2): 1 mg via ORAL
  Filled 2014-06-24 (×2): qty 1

## 2014-06-24 MED ORDER — OXYCODONE HCL ER 20 MG PO T12A
20.0000 mg | EXTENDED_RELEASE_TABLET | Freq: Two times a day (BID) | ORAL | Status: DC
  Administered 2014-06-24 – 2014-07-02 (×16): 20 mg via ORAL
  Filled 2014-06-24: qty 2
  Filled 2014-06-24 (×15): qty 1

## 2014-06-24 MED ORDER — CLONIDINE HCL 0.1 MG PO TABS: 0.1000 mg | ORAL_TABLET | ORAL | Status: DC

## 2014-06-24 MED ORDER — LORAZEPAM 1 MG PO TABS: 1.0000 mg | ORAL_TABLET | Freq: Two times a day (BID) | ORAL | Status: DC

## 2014-06-24 MED ORDER — ONDANSETRON 4 MG PO TBDP: 4.0000 mg | ORAL_TABLET | Freq: Four times a day (QID) | ORAL | Status: AC | PRN

## 2014-06-24 MED ORDER — CHLORDIAZEPOXIDE HCL 25 MG PO CAPS: 25.0000 mg | ORAL_CAPSULE | Freq: Four times a day (QID) | ORAL | Status: AC | PRN

## 2014-06-24 MED ORDER — LORAZEPAM 1 MG PO TABS: 1.0000 mg | ORAL_TABLET | Freq: Three times a day (TID) | ORAL | Status: DC

## 2014-06-24 MED ORDER — CLONIDINE HCL 0.1 MG PO TABS: 0.1000 mg | ORAL_TABLET | Freq: Every day | ORAL | Status: DC

## 2014-06-24 MED ORDER — CHLORDIAZEPOXIDE HCL 25 MG PO CAPS
25.0000 mg | ORAL_CAPSULE | Freq: Three times a day (TID) | ORAL | Status: AC
  Administered 2014-06-25 – 2014-06-26 (×3): 25 mg via ORAL
  Filled 2014-06-24 (×3): qty 1

## 2014-06-24 MED ORDER — CHLORDIAZEPOXIDE HCL 25 MG PO CAPS
25.0000 mg | ORAL_CAPSULE | Freq: Four times a day (QID) | ORAL | Status: AC
  Administered 2014-06-24 – 2014-06-25 (×3): 25 mg via ORAL
  Filled 2014-06-24 (×3): qty 1

## 2014-06-24 MED ORDER — DICYCLOMINE HCL 20 MG PO TABS: 20.0000 mg | ORAL_TABLET | Freq: Four times a day (QID) | ORAL | Status: AC | PRN

## 2014-06-24 MED ORDER — CLONIDINE HCL 0.1 MG PO TABS
0.1000 mg | ORAL_TABLET | Freq: Four times a day (QID) | ORAL | Status: DC
  Filled 2014-06-24 (×3): qty 1

## 2014-06-24 MED ORDER — CHLORDIAZEPOXIDE HCL 25 MG PO CAPS
25.0000 mg | ORAL_CAPSULE | ORAL | Status: AC
  Administered 2014-06-26 – 2014-06-27 (×2): 25 mg via ORAL
  Filled 2014-06-24 (×2): qty 1

## 2014-06-24 MED ORDER — VITAMIN B-1 100 MG PO TABS
100.0000 mg | ORAL_TABLET | Freq: Every day | ORAL | Status: DC
  Administered 2014-06-24 – 2014-07-02 (×9): 100 mg via ORAL
  Filled 2014-06-24 (×11): qty 1

## 2014-06-24 MED ORDER — ADULT MULTIVITAMIN W/MINERALS CH
1.0000 | ORAL_TABLET | Freq: Every day | ORAL | Status: DC
  Administered 2014-06-24 – 2014-07-02 (×9): 1 via ORAL
  Filled 2014-06-24 (×11): qty 1

## 2014-06-24 MED ORDER — LORAZEPAM 1 MG PO TABS: 1.0000 mg | ORAL_TABLET | Freq: Every day | ORAL | Status: DC

## 2014-06-24 MED ORDER — METHOCARBAMOL 500 MG PO TABS
500.0000 mg | ORAL_TABLET | Freq: Three times a day (TID) | ORAL | Status: AC | PRN
  Administered 2014-06-25: 500 mg via ORAL
  Filled 2014-06-24 (×2): qty 1

## 2014-06-24 MED ORDER — NAPROXEN 500 MG PO TABS: 500.0000 mg | ORAL_TABLET | Freq: Two times a day (BID) | ORAL | Status: AC | PRN

## 2014-06-24 MED ORDER — CHLORDIAZEPOXIDE HCL 25 MG PO CAPS
25.0000 mg | ORAL_CAPSULE | Freq: Every day | ORAL | Status: AC
  Administered 2014-06-28: 25 mg via ORAL
  Filled 2014-06-24: qty 1

## 2014-06-24 NOTE — Progress Notes (Signed)
D: Pt denies SI/HI/AVH. Pt is pleasant and cooperative. Pt only obsessed with pain medication and COPD medication. Pt forwards little unless it is about his medication.   A: Pt was offered support and encouragement. Pt was given scheduled medications. Pt was encourage to attend groups. Q 15 minute checks were done for safety.   R:Pt is taking medication. Pt receptive to treatment and safety maintained on unit.
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
## 2014-06-24 NOTE — Progress Notes (Signed)
Adult Psychoeducational Group Note  Date:  06/24/2014 Time:  1000  Group Topic/Focus:  Recovery Goals:   The focus of this group is to identify appropriate goals for recovery and establish a plan to achieve them.  Participation Level:  Active  Participation Quality:  Appropriate  Affect:  Appropriate  Cognitive:  Appropriate  Insight: Appropriate  Engagement in Group:  Engaged  Modes of Intervention:  Education  Additional Comments:    Manford Sprong L 06/24/2014, 12:39 PM
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
## 2014-06-24 NOTE — BHH Group Notes (Signed)
[place] LCSW Group Therapy  06/24/2014 2:37 PM  Type of Therapy:  Group Therapy  Participation Level:  Did Not Attend - patient invited to attend group but did not show up.  Steven Wiley 06/24/2014, 2:37 PM

## 2014-06-24 NOTE — Progress Notes (Signed)
D: Patient has anxious affect and mood. Declined completing the self inventory sheet. He voices concerns about his medications, especially those for pain. Patient has been participating in the group sessions today, visible in the milieu and interactive with peers in the dayroom. He adheres to the current medication regimen.  A: Support and encouragement provided to patient. Administered medications to pt. per ordering MD. Monitor Q15 minute checks for safety.  R: Patient receptive. Endorses passive SI, but contracts for safety. Denies HI and AVH. Patient remains safe on the unit.

## 2014-06-24 NOTE — Plan of Care (Signed)
Problem: Alteration in mood & ability to function due to Goal: LTG-Pt reports reduction in suicidal thoughts (Patient reports reduction in suicidal thoughts and is able to verbalize a safety plan for whenever patient is feeling suicidal)  Outcome: Progressing Pt denies SI  Problem: Ineffective individual coping Goal: STG:Pt. will utilize relaxation techniques to reduce stress STG: Patient will utilize relaxation techniques to reduce stress levels  Outcome: Not Progressing Pt not using breathing techniques explained to help reduce anxiety  Problem: Alteration in mood Goal: LTG-Patient reports reduction in suicidal thoughts (Patient reports reduction in suicidal thoughts and is able to verbalize a safety plan for whenever patient is feeling suicidal)  Outcome: Progressing Pt denies SI at this time

## 2014-06-24 NOTE — Progress Notes (Signed)
Recreation Therapy Notes  Animal-Assisted Activity/Therapy (AAA/T) Program Checklist/Progress Notes Patient Eligibility Criteria Checklist & Daily Group note for Rec Tx Intervention  Date: 12.29.2015 Time: 2:45pm Location: [address]    AAA/T Program Assumption of Risk Form signed by Patient/ or Parent Legal Guardian yes  Patient is free of allergies or sever asthma yes  Patient reports no fear of animals yes  Patient reports no history of cruelty to animals yes  Patient understands his/her participation is voluntary yes  Patient washes hands before animal contact yes  Patient washes hands after animal contact yes  Behavioral Response: Appropriate   Education: Hand Washing, Appropriate Animal Interaction   Education Outcome: Acknowledges education.   Clinical Observations/Feedback: Patient interacted appropriately with therapy dog and handler, asking appropriate questions about therapy dog and his training and sharing stories about his pet from many years ago. Patient shared he is thinking about getting a pet in the near future, but still feels a sense of lose for the dog he lost approximately 30 years ago.   Laureen Ochs Kimarion Chery, LRT/CTRS  Lane Hacker 06/24/2014 4:24 PM

## 2014-06-24 NOTE — Clinical Social Work Note (Signed)
Per [employer], records show that patient is considered eligible for disability and Medicaid, but last received 05/26/14.  May not be currently receiving either SSI or Medicaid.  Unclear from [facility] records why patient is not eligible for benefits at present.  Edwyna Shell, LCSW Clinical Social Worker

## 2014-06-24 NOTE — BHH Suicide Risk Assessment (Signed)
[place] INPATIENT:  Family/Significant Other Suicide Prevention Education  Suicide Prevention Education:  Education Completed; Erline Hau, Friend, [phone]; has been identified by the patient as the family member/significant other with whom the patient will be residing, and identified as the person(s) who will aid the patient in the event of a mental health crisis (suicidal ideations/suicide attempt).  With written consent from the patient, the family member/significant other has been provided the following suicide prevention education, prior to the and/or following the discharge of the patient.  The suicide prevention education provided includes the following:  Suicide risk factors  Suicide prevention and interventions  National Suicide Hotline telephone number  [facility] assessment telephone number  [facility] Emergency Assistance [place] and/or Residential Mobile Crisis Unit telephone number  Request made of family/significant other to:  Remove weapons (e.g., guns, rifles, knives), all items previously/currently identified as safety concern.   Friend advised patient does not have access to weapons.     Remove drugs/medications (over-the-counter, prescriptions, illicit drugs), all items previously/currently identified as a safety concern.  The family member/significant other verbalizes understanding of the suicide prevention education information provided.  The family member/significant other agrees to remove the items of safety concern listed above.  Jennae Hakeem Hairston 06/24/2014, 1:15 PM

## 2014-06-24 NOTE — Progress Notes (Signed)
Pt was invited to the [place] speaker meeting, however he remained in his room.
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
## 2014-06-24 NOTE — Progress Notes (Signed)
D: Pt denies SI/HI/AVH. Pt is pleasant and cooperative. Pt forwards little information unless it involves his medication. That's all pt is focused on.   A: Pt was offered support and encouragement. Pt was given scheduled medications. Pt was encourage to attend groups. Q 15 minute checks were done for safety.   R:Pt attends groups and interacts well with peers and staff. Pt is taking medication.Pt receptive to treatment and safety maintained on unit.

## 2014-06-25 DIAGNOSIS — F411 Generalized anxiety disorder: Secondary | ICD-10-CM

## 2014-06-25 MED ORDER — ARFORMOTEROL TARTRATE 15 MCG/2ML IN NEBU
15.0000 ug | INHALATION_SOLUTION | Freq: Every day | RESPIRATORY_TRACT | Status: DC
  Administered 2014-06-25 – 2014-06-29 (×2): 15 ug via RESPIRATORY_TRACT
  Filled 2014-06-25 (×9): qty 2

## 2014-06-25 MED ORDER — BUDESONIDE 0.25 MG/2ML IN SUSP
0.2500 mg | Freq: Every day | RESPIRATORY_TRACT | Status: DC
  Administered 2014-06-25 – 2014-06-29 (×2): 0.25 mg via RESPIRATORY_TRACT
  Filled 2014-06-25 (×9): qty 2

## 2014-06-25 NOTE — Progress Notes (Signed)
D: Pt denies SI/HI/AVH. Pt is pleasant and cooperative. Pt   A: Pt was offered support and encouragement. Pt was given scheduled medications. Pt was encourage to attend groups. Q 15 minute checks were done for safety. Explained various different techniques and educated on non-pharmacological pain relief.  R:Pt attends groups and interacts well with peers and staff. Pt is taking medication. Pt has no complaints.Pt receptive to treatment and safety maintained on unit. Pt receptive and wants to try different things and is edgar to talk to his Dr.

## 2014-06-25 NOTE — Progress Notes (Signed)
[facility] MD Progress Note  06/25/2014 4:34 PM Steven Wiley Sr.  MRN:  [id number] Subjective:  Steven Wiley is having a hard time. He is still complaining of pain. Still feeling down depressed. Admits he is grieving all his losses: his health, his job, relationships. States he wants to feel better and have hope but at times is very hard to do it. He states he went to alcohol to deal with the depression and to manage the pain when he did not have the pain pills Diagnosis:   DSM5: Substance/Addictive Disorders:  Alcohol Related Disorder - Severe (303.90) Depressive Disorders:  Major Depressive Disorder - Severe (296.23) Total Time spent with patient: 30 minutes  Axis I: Generalized Anxiety Disorder and Substance Induced Mood Disorder  ADL's:  Intact  Sleep: Poor  Appetite:  Fair Psychiatric Specialty Exam: Physical Exam  Review of Systems  Constitutional: Positive for malaise/fatigue.  HENT: Negative.   Eyes: Negative.   Respiratory: Negative.   Cardiovascular: Negative.   Gastrointestinal: Negative.   Genitourinary: Negative.   Musculoskeletal: Positive for back pain and joint pain.  Skin: Negative.   Neurological: Negative.   Endo/Heme/Allergies: Negative.   Psychiatric/Behavioral: Positive for depression and substance abuse. The patient is nervous/anxious and has insomnia.     Blood pressure 106/74, pulse 67, temperature 97.3 F (36.3 C), temperature source Oral, resp. rate 18, height 5\' 8"  (1.727 m), weight 97.297 kg (214 lb 8 oz).Body mass index is 32.62 kg/(m^2).  General Appearance: Fairly Groomed  Engineer, water::  Fair  Speech:  Clear and Coherent, Slow and not spontaneous  Volume:  Decreased  Mood:  Anxious, Depressed and worried  Affect:  Depressed and teary eyed when talking about his losses  Thought Process:  Coherent and Goal Directed  Orientation:  Full (Time, Place, and Person)  Thought Content:  events symptoms worries concerns  Suicidal Thoughts:  Yes.  without  intent/plan  Homicidal Thoughts:  No  Memory:  Immediate;   Fair Recent;   Fair Remote;   Fair  Judgement:  Fair  Insight:  Present and Shallow  Psychomotor Activity:  Restlessness  Concentration:  Fair  Recall:  [employer] of Knowledge:Fair  Language: Fair  Akathisia:  No  Handed:    AIMS (if indicated):     Assets:  Desire for Improvement  Sleep:  Number of Hours: 6.5   Musculoskeletal: Strength & Muscle Tone: within normal limits Gait & Station: normal Patient leans: N/A  Current Medications: Current Facility-Administered Medications  Medication Dose Route Frequency Provider Last Rate Last Dose  . Aclidinium Bromide AEPB 1 puff  1 puff Inhalation BID Waylan Boga, NP   1 puff at 06/25/14 0806  . albuterol (PROVENTIL) (2.5 MG/3ML) 0.083% nebulizer solution 2.5 mg  2.5 mg Nebulization Q6H PRN Waylan Boga, NP      . alum & mag hydroxide-simeth (MAALOX/MYLANTA) 200-200-20 MG/5ML suspension 30 mL  30 mL Oral Q4H PRN Waylan Boga, NP      . arformoterol [facility]) nebulizer solution 15 mcg  15 mcg Nebulization QHS Nicholaus Bloom, MD      . budesonide (PULMICORT) nebulizer solution 0.25 mg  0.25 mg Nebulization QHS Nicholaus Bloom, MD      . chlordiazePOXIDE (LIBRIUM) capsule 25 mg  25 mg Oral Q6H PRN Shuvon Rankin, NP      . chlordiazePOXIDE (LIBRIUM) capsule 25 mg  25 mg Oral TID Shuvon Rankin, NP   25 mg at 06/25/14 1207   Followed by  . [START ON  06/26/2014] chlordiazePOXIDE (LIBRIUM) capsule 25 mg  25 mg Oral BH-qamhs Shuvon Rankin, NP       Followed by  . [START ON 06/28/2014] chlordiazePOXIDE (LIBRIUM) capsule 25 mg  25 mg Oral Daily Shuvon Rankin, NP      . dicyclomine (BENTYL) tablet 20 mg  20 mg Oral Q6H PRN Shuvon Rankin, NP      . FLUoxetine (PROZAC) capsule 40 mg  40 mg Oral Daily Waylan Boga, NP   40 mg at 06/25/14 0804  . hydrOXYzine (ATARAX/VISTARIL) tablet 25 mg  25 mg Oral Q6H PRN Nicholaus Bloom, MD      . Ipratropium-Albuterol (COMBIVENT) respimat 1 puff  1 puff  Inhalation BID Waylan Boga, NP   1 puff at 06/25/14 [phone]  . loperamide (IMODIUM) capsule 2-4 mg  2-4 mg Oral PRN Nicholaus Bloom, MD      . magnesium hydroxide (MILK OF MAGNESIA) suspension 30 mL  30 mL Oral Daily PRN Waylan Boga, NP      . methocarbamol (ROBAXIN) tablet 500 mg  500 mg Oral Q8H PRN Shuvon Rankin, NP   500 mg at 06/25/14 0640  . multivitamin with minerals tablet 1 tablet  1 tablet Oral Daily Nicholaus Bloom, MD   1 tablet at 06/25/14 [phone]  . naproxen (NAPROSYN) tablet 500 mg  500 mg Oral BID PRN Shuvon Rankin, NP      . nicotine (NICODERM CQ - dosed in mg/24 hours) patch 14 mg  14 mg Transdermal Daily Waylan Boga, NP   14 mg at 06/25/14 0803  . ondansetron (ZOFRAN-ODT) disintegrating tablet 4 mg  4 mg Oral Q6H PRN Nicholaus Bloom, MD      . oxyCODONE (Oxy IR/ROXICODONE) immediate release tablet 15 mg  15 mg Oral Q6H PRN Nicholaus Bloom, MD   15 mg at 06/25/14 1207  . OxyCODONE (OXYCONTIN) 12 hr tablet 20 mg  20 mg Oral Q12H Nicholaus Bloom, MD   20 mg at 06/25/14 0804  . pregabalin (LYRICA) capsule 25 mg  25 mg Oral QID Waylan Boga, NP   25 mg at 06/25/14 1207  . thiamine (VITAMIN B-1) tablet 100 mg  100 mg Oral Daily Nicholaus Bloom, MD   100 mg at 06/25/14 0804  . traZODone (DESYREL) tablet 200 mg  200 mg Oral QHS Waylan Boga, NP   200 mg at 06/24/14 2126    Lab Results: No results found for this or any previous visit (from the past 48 hour(s)).  Physical Findings: AIMS: Facial and Oral Movements Muscles of Facial Expression: None, normal Lips and Perioral Area: None, normal Jaw: None, normal Tongue: None, normal,Extremity Movements Upper (arms, wrists, hands, fingers): None, normal Lower (legs, knees, ankles, toes): None, normal, Trunk Movements Neck, shoulders, hips: None, normal, Overall Severity Severity of abnormal movements (highest score from questions above): None, normal Incapacitation due to abnormal movements: None, normal Patient's awareness of abnormal movements  (rate only patient's report): No Awareness, Dental Status Current problems with teeth and/or dentures?: No Does patient usually wear dentures?: No  CIWA:  CIWA-Ar Total: 5 COWS:  COWS Total Score: 1  Treatment Plan Summary: Daily contact with patient to assess and evaluate symptoms and progress in treatment Medication management  Plan: Supportive approach/coping skills/relapse prevention           Alcohol dependence: continue to detox/relapse prevention plan           Depression: optimize response to Prozac/CBT:mindfulness  Pain: optimize pain management regime  Medical Decision Making Problem Points:  Established problem, worsening (2) and Review of psycho-social stressors (1) Data Points:  Review of medication regiment & side effects (2) Review of new medications or change in dosage (2)  I certify that inpatient services furnished can reasonably be expected to improve the patient's condition.   Tomeko Scoville A 06/25/2014, 4:34 PM

## 2014-06-25 NOTE — BHH Group Notes (Signed)
[place] LCSW Group Therapy  Emotional Regulation 1:15 - 2: 30 PM        06/25/2014  4:29 PM    Type of Therapy:  Group Therapy  Participation Level:  Appropriate  Participation Quality:  Appropriate  Affect:  Appropriate  Cognitive:  Attentive Appropriate  Insight:  Developing/Improving Engaged  Engagement in Therapy:  Developing/Improving Engaged  Modes of Intervention:  Discussion Exploration Problem-Solving Supportive  Summary of Progress/Problems:  Group topic was emotional regulations.  Patient participated in the discussion and was able to identify an emotion that needed to regulated.  Patient was able to identify approprite coping skills.   Patient advised he has to learn to control his anger.  He shared his anger stems from a lot a bad decisions/choices he has made in life.  Concha Pyo 06/25/2014 4:29 PM

## 2014-06-25 NOTE — BHH Group Notes (Signed)
Adult Psychoeducational Group Note  Date:  06/25/2014 Time:  10:07 PM  Group Topic/Focus:  Wrap-Up Group:   The focus of this group is to help patients review their daily goal of treatment and discuss progress on daily workbooks.  Participation Level:  Active   Additional Comments:  Tonight's group was NA group and this pt did attend group.   Lavinia Sharps P 06/25/2014, 10:07 PM

## 2014-06-25 NOTE — BHH Group Notes (Signed)
[facility] LCSW Aftercare Discharge Planning Group Note   06/25/2014 12:52 PM    Participation Quality:  Appropraite  Mood/Affect:  Appropriate  Depression Rating:  8  Anxiety Rating:  8  Thoughts of Suicide:  No  Will you contract for safety?   NA  Current AVH:  No  Plan for Discharge/Comments:  Patient attended discharge planning group and actively participated in group.  He advised of being homeless and interested in an assisted living.  Suicide prevention education reviewed and SPE document provided.   Transportation Means: Patient uses public transportation.   Supports:  Patient has a limited support system.   Steven Wiley, [place]

## 2014-06-25 NOTE — Progress Notes (Signed)
D: Patient presents with anxious affect and mood. He reported on the self inventory sheet that he's sleeping fair at night, good appetite, low energy level and ability to concentrate is better today. Patient rates depression/feelings of hopelessness "7" and anxiety "8". Continues to attend groups; conversing with peers in the dayroom. He complained of back pain earlier 9/10. In compliance with medications and tolerating them well.  A: Support and encouragement provided to patient. Scheduled medications given per MD orders. PRN Oxycodone administered for pain. Maintain Q15 minute checks for safety.  R: Patient receptive. Denies SI/HI and AVH. Patient remains safe on the hall.

## 2014-06-26 MED ORDER — PREGABALIN 50 MG PO CAPS
50.0000 mg | ORAL_CAPSULE | Freq: Four times a day (QID) | ORAL | Status: DC
  Administered 2014-06-26 – 2014-07-02 (×25): 50 mg via ORAL
  Filled 2014-06-26 (×25): qty 1

## 2014-06-26 NOTE — BHH Group Notes (Signed)
[place] Group Notes:  (Nursing/MHT/Case Management/Adjunct)  Date:  06/26/2014  Time:  9:00am  Type of Therapy:  Nurse Education  Participation Level:  Active  Participation Quality:  Appropriate  Affect:  Appropriate  Cognitive:  Appropriate  Insight:  Improving  Engagement in Group:  Engaged  Modes of Intervention:  Discussion, Education and Support    Summary of Progress/Problems: Patient came in late to group however was engaged in discussion. Was supportive of peers. States short term goal is to get control of his pain and his long term goal is to stay focused on recover and increasing the positives in his life.  Jamie Kato 06/26/2014, 10:01 AM

## 2014-06-26 NOTE — Progress Notes (Signed)
[facility] MD Progress Note  06/26/2014 2:07 PM Steven Schroeder Sr.  MRN:  [id number] Subjective:  Steven Wiley states he gets very upset very depressed, down on himself, angry with the circumstances. He states he deals with the pain ( states worst due to the rain) everyday. States this sets the stage for how his mood is going to be. Right now dealing with the uncertainty of where to go from here. Upset as he states he is not going to be able to make without a stable place to live.  Diagnosis:   DSM5: Substance/Addictive Disorders:  Alcohol Related Disorder - Moderate (303.90) Depressive Disorders:  Major Depressive Disorder - Severe (296.23) Total Time spent with patient: 30 minutes  Axis I: Substance Induced Mood Disorder  ADL's:  Intact  Sleep: Fair  Appetite:  Fair  Psychiatric Specialty Exam: Physical Exam  Review of Systems  Constitutional: Positive for malaise/fatigue.  HENT: Negative.   Eyes: Negative.   Respiratory: Negative.   Cardiovascular: Negative.   Gastrointestinal: Negative.   Genitourinary: Negative.   Musculoskeletal: Positive for back pain and joint pain.  Skin: Negative.   Neurological: Positive for weakness.  Endo/Heme/Allergies: Negative.   Psychiatric/Behavioral: Positive for depression and substance abuse. The patient is nervous/anxious.     Blood pressure 107/64, pulse 90, temperature 98.2 F (36.8 C), temperature source Oral, resp. rate 18, height 5\' 8"  (1.727 m), weight 97.297 kg (214 lb 8 oz).Body mass index is 32.62 kg/(m^2).  General Appearance: Fairly Groomed  Engineer, water::  Fair  Speech:  Clear and Coherent  Volume:  Decreased  Mood:  Anxious, Depressed and Dysphoric  Affect:  sad, worried  Thought Process:  Coherent and Goal Directed  Orientation:  Full (Time, Place, and Person)  Thought Content:  symptoms events worries concerns  Suicidal Thoughts:  No  Homicidal Thoughts:  No  Memory:  Immediate;   Fair Recent;   Fair Remote;   Fair  Judgement:   Fair  Insight:  Present  Psychomotor Activity:  Restlessness  Concentration:  Fair  Recall:  [employer] of Knowledge:Fair  Language: Fair  Akathisia:  No  Handed:    AIMS (if indicated):     Assets:  Desire for Improvement  Sleep:  Number of Hours: 6.75   Musculoskeletal: Strength & Muscle Tone: within normal limits Gait & Station: normal Patient leans: N/A  Current Medications: Current Facility-Administered Medications  Medication Dose Route Frequency Provider Last Rate Last Dose  . Aclidinium Bromide AEPB 1 puff  1 puff Inhalation BID Waylan Boga, NP   1 puff at 06/26/14 [phone]  . albuterol (PROVENTIL) (2.5 MG/3ML) 0.083% nebulizer solution 2.5 mg  2.5 mg Nebulization Q6H PRN Waylan Boga, NP      . alum & mag hydroxide-simeth (MAALOX/MYLANTA) 200-200-20 MG/5ML suspension 30 mL  30 mL Oral Q4H PRN Waylan Boga, NP      . arformoterol [facility]) nebulizer solution 15 mcg  15 mcg Nebulization QHS Nicholaus Bloom, MD   15 mcg at 06/25/14 2200  . budesonide (PULMICORT) nebulizer solution 0.25 mg  0.25 mg Nebulization QHS Nicholaus Bloom, MD   0.25 mg at 06/25/14 2200  . chlordiazePOXIDE (LIBRIUM) capsule 25 mg  25 mg Oral Q6H PRN Shuvon Rankin, NP      . chlordiazePOXIDE (LIBRIUM) capsule 25 mg  25 mg Oral BH-qamhs Shuvon Rankin, NP       Followed by  . [START ON 06/28/2014] chlordiazePOXIDE (LIBRIUM) capsule 25 mg  25 mg Oral Daily Shuvon Rankin,  NP      . dicyclomine (BENTYL) tablet 20 mg  20 mg Oral Q6H PRN Shuvon Rankin, NP      . FLUoxetine (PROZAC) capsule 40 mg  40 mg Oral Daily Waylan Boga, NP   40 mg at 06/26/14 0851  . hydrOXYzine (ATARAX/VISTARIL) tablet 25 mg  25 mg Oral Q6H PRN Nicholaus Bloom, MD   25 mg at 06/26/14 1322  . Ipratropium-Albuterol (COMBIVENT) respimat 1 puff  1 puff Inhalation BID Waylan Boga, NP   1 puff at 06/26/14 [phone]  . loperamide (IMODIUM) capsule 2-4 mg  2-4 mg Oral PRN Nicholaus Bloom, MD      . magnesium hydroxide (MILK OF MAGNESIA) suspension 30 mL  30 mL  Oral Daily PRN Waylan Boga, NP      . methocarbamol (ROBAXIN) tablet 500 mg  500 mg Oral Q8H PRN Shuvon Rankin, NP   500 mg at 06/25/14 0640  . multivitamin with minerals tablet 1 tablet  1 tablet Oral Daily Nicholaus Bloom, MD   1 tablet at 06/26/14 [phone]  . naproxen (NAPROSYN) tablet 500 mg  500 mg Oral BID PRN Shuvon Rankin, NP      . nicotine (NICODERM CQ - dosed in mg/24 hours) patch 14 mg  14 mg Transdermal Daily Waylan Boga, NP   14 mg at 06/26/14 0850  . ondansetron (ZOFRAN-ODT) disintegrating tablet 4 mg  4 mg Oral Q6H PRN Nicholaus Bloom, MD      . oxyCODONE (Oxy IR/ROXICODONE) immediate release tablet 15 mg  15 mg Oral Q6H PRN Nicholaus Bloom, MD   15 mg at 06/26/14 1321  . OxyCODONE (OXYCONTIN) 12 hr tablet 20 mg  20 mg Oral Q12H Nicholaus Bloom, MD   20 mg at 06/26/14 0850  . pregabalin (LYRICA) capsule 50 mg  50 mg Oral QID Nicholaus Bloom, MD   50 mg at 06/26/14 1218  . thiamine (VITAMIN B-1) tablet 100 mg  100 mg Oral Daily Nicholaus Bloom, MD   100 mg at 06/26/14 0851  . traZODone (DESYREL) tablet 200 mg  200 mg Oral QHS Waylan Boga, NP   200 mg at 06/25/14 2140    Lab Results: No results found for this or any previous visit (from the past 48 hour(s)).  Physical Findings: AIMS: Facial and Oral Movements Muscles of Facial Expression: None, normal Lips and Perioral Area: None, normal Jaw: None, normal Tongue: None, normal,Extremity Movements Upper (arms, wrists, hands, fingers): None, normal Lower (legs, knees, ankles, toes): None, normal, Trunk Movements Neck, shoulders, hips: None, normal, Overall Severity Severity of abnormal movements (highest score from questions above): None, normal Incapacitation due to abnormal movements: None, normal Patient's awareness of abnormal movements (rate only patient's report): No Awareness, Dental Status Current problems with teeth and/or dentures?: No Does patient usually wear dentures?: No  CIWA:  CIWA-Ar Total: 3 COWS:  COWS Total Score:  8  Treatment Plan Summary: Daily contact with patient to assess and evaluate symptoms and progress in treatment Medication management  Plan: Supportive approach/coping skills/relapse prevention           Alcohol Dependence; continue the detox protocol           Depression: states the Prozac has helped with the depression, feels that the depression right now is more circumstantial.             Pain: will increase the Lyrica to 50 mg QID  Will continue to explore placement options Medical Decision Making Problem Points:  Established problem, worsening (2) and Review of psycho-social stressors (1) Data Points:  Review of medication regiment & side effects (2) Review of new medications or change in dosage (2)  I certify that inpatient services furnished can reasonably be expected to improve the patient's condition.   [place] A 06/26/2014, 2:07 PM

## 2014-06-26 NOTE — Clinical Social Work Note (Signed)
CSW attempted to contact [facility] [phone] regarding referral made for placement yesterday 12/30. No answer and unable to leave voicemail, CSW will continue to follow for support and discharge planning.  Tilden Fossa, MSW, [place] Worker [facility] [phone]

## 2014-06-26 NOTE — Plan of Care (Signed)
Problem: Alteration in mood & ability to function due to Goal: LTG-Pt verbalizes understanding of importance of med regimen (Patient verbalizes understanding of importance of medication regimen and need to continue outpatient care and support groups)  Outcome: Progressing Pt expresses the importance of taking medication as directed Goal: STG-Patient will attend groups Outcome: Progressing Pt attending groups on unit

## 2014-06-26 NOTE — Clinical Social Work Note (Signed)
CSW attempted to contact Ms. Ardelle Lesches with [facility] at 10:30 am and again at 1pm, no answer and unable to leave message at this time.   Colletta Maryland from [facility] states that she will be at hospital to complete assessment with patient between 1:30-2pm.  Tilden Fossa, MSW, [place] Worker [facility] [phone]

## 2014-06-26 NOTE — BHH Group Notes (Signed)
[place] LCSW Group Therapy 06/26/2014  1:15 PM   Type of Therapy: Group Therapy  Participation Level: Patient attended group shortly before being called out by RN.   Tilden Fossa, MSW, [place] Worker [facility] [phone]

## 2014-06-27 DIAGNOSIS — F1994 Other psychoactive substance use, unspecified with psychoactive substance-induced mood disorder: Secondary | ICD-10-CM

## 2014-06-27 NOTE — BHH Group Notes (Signed)
Adult Psychoeducational Group Note  Date:  06/27/2014 Time:  10:07 PM  Group Topic/Focus:  AA Meeting  Participation Level:  Minimal  Participation Quality:  Attentive  Affect:  Flat  Cognitive:  Alert  Insight: Limited  Engagement in Group:  Limited  Modes of Intervention:  Discussion and Education  Additional Comments:  Steven Wiley attended group.  Victorino Sparrow A 06/27/2014, 10:07 PM

## 2014-06-27 NOTE — Progress Notes (Signed)
Patient ID: Steven CRATTY Sr., male   DOB: December 07, 1955, 59 y.o.   MRN: [id number]   D: When asked about his day, pt stated, "It was ok." Stated, "I didn't feel all that good yesterday. But today It caught up with me and I had to lay down after chow". Pt complained of back pain, stated "It's been hurting pretty good for the last couple of days".   A:  Support and encouragement was offered. 15 min checks continued for safety.  R: Pt remains safe.

## 2014-06-27 NOTE — Progress Notes (Signed)
Patient ID: Steven ISIP Sr., male   DOB: Aug 18, 1955, 59 y.o.   MRN: [id number] [facility] MD Progress Note  06/27/2014 1:23 PM Steven Schroeder Sr.  MRN:  [id number] Subjective:  Patient seen and chart reviewed. He reports ongoing anxiety, worries and depressive symptoms. Patient rates his anxiety as 7/10 and depression 8/10. Patient currently denies suicidal or homicidal ideation, intent or plan. However, he continues to report dealing with chronic pain and other social issues. States this sets the stage for how his mood is going to be. Right now dealing with the uncertainty of where to go from here. Upset as he states he is not going to be able to make without a stable place to live.  Diagnosis:   DSM5: Substance/Addictive Disorders:  Alcohol Related Disorder - Moderate (303.90) Depressive Disorders:  Major Depressive Disorder - Severe (296.23) Total Time spent with patient: 30 minutes  Axis I: Substance Induced Mood Disorder  ADL's:  Intact  Sleep: Fair  Appetite:  Fair  Psychiatric Specialty Exam: Physical Exam  Review of Systems  Constitutional: Positive for malaise/fatigue.  HENT: Negative.   Eyes: Negative.   Respiratory: Negative.   Cardiovascular: Negative.   Gastrointestinal: Negative.   Genitourinary: Negative.   Musculoskeletal: Positive for back pain and joint pain.  Skin: Negative.   Neurological: Positive for weakness.  Endo/Heme/Allergies: Negative.   Psychiatric/Behavioral: Positive for depression and substance abuse. The patient is nervous/anxious.     Blood pressure 117/67, pulse 75, temperature 98 F (36.7 C), temperature source Oral, resp. rate 16, height 5\' 8"  (1.727 m), weight 97.297 kg (214 lb 8 oz).Body mass index is 32.62 kg/(m^2).  General Appearance: Fairly Groomed  Engineer, water::  Fair  Speech:  Clear and Coherent  Volume:  Decreased  Mood:  Anxious, Depressed and Dysphoric  Affect:  sad, worried  Thought Process:  Coherent and Goal Directed  Orientation:   Full (Time, Place, and Person)  Thought Content:  symptoms events worries concerns  Suicidal Thoughts:  No  Homicidal Thoughts:  No  Memory:  Immediate;   Fair Recent;   Fair Remote;   Fair  Judgement:  Fair  Insight:  Present  Psychomotor Activity:  Restlessness  Concentration:  Fair  Recall:  [employer] of [place]: Fair  Akathisia:  No  Handed:    AIMS (if indicated):     Assets:  Desire for Improvement  Sleep:  Number of Hours: 6.5   Musculoskeletal: Strength & Muscle Tone: within normal limits Gait & Station: normal Patient leans: N/A  Current Medications: Current Facility-Administered Medications  Medication Dose Route Frequency Provider Last Rate Last Dose  . Aclidinium Bromide AEPB 1 puff  1 puff Inhalation BID Waylan Boga, NP   1 puff at 06/26/14 1717  . albuterol (PROVENTIL) (2.5 MG/3ML) 0.083% nebulizer solution 2.5 mg  2.5 mg Nebulization Q6H PRN Waylan Boga, NP      . alum & mag hydroxide-simeth (MAALOX/MYLANTA) 200-200-20 MG/5ML suspension 30 mL  30 mL Oral Q4H PRN Waylan Boga, NP      . arformoterol (BROVANA) nebulizer solution 15 mcg  15 mcg Nebulization QHS Nicholaus Bloom, MD   15 mcg at 06/25/14 2200  . budesonide (PULMICORT) nebulizer solution 0.25 mg  0.25 mg Nebulization QHS Nicholaus Bloom, MD   0.25 mg at 06/25/14 2200  . chlordiazePOXIDE (LIBRIUM) capsule 25 mg  25 mg Oral Q6H PRN Shuvon Rankin, NP      . [START ON 06/28/2014] chlordiazePOXIDE (LIBRIUM) capsule  25 mg  25 mg Oral Daily Shuvon Rankin, NP      . dicyclomine (BENTYL) tablet 20 mg  20 mg Oral Q6H PRN Shuvon Rankin, NP      . FLUoxetine (PROZAC) capsule 40 mg  40 mg Oral Daily Waylan Boga, NP   40 mg at 06/27/14 0817  . Ipratropium-Albuterol (COMBIVENT) respimat 1 puff  1 puff Inhalation BID Waylan Boga, NP   1 puff at 06/27/14 0816  . magnesium hydroxide (MILK OF MAGNESIA) suspension 30 mL  30 mL Oral Daily PRN Waylan Boga, NP      . methocarbamol (ROBAXIN) tablet 500 mg  500  mg Oral Q8H PRN Shuvon Rankin, NP   500 mg at 06/25/14 0640  . multivitamin with minerals tablet 1 tablet  1 tablet Oral Daily Nicholaus Bloom, MD   1 tablet at 06/27/14 0818  . naproxen (NAPROSYN) tablet 500 mg  500 mg Oral BID PRN Shuvon Rankin, NP      . nicotine (NICODERM CQ - dosed in mg/24 hours) patch 14 mg  14 mg Transdermal Daily Waylan Boga, NP   14 mg at 06/27/14 0819  . oxyCODONE (Oxy IR/ROXICODONE) immediate release tablet 15 mg  15 mg Oral Q6H PRN Nicholaus Bloom, MD   15 mg at 06/27/14 1211  . OxyCODONE (OXYCONTIN) 12 hr tablet 20 mg  20 mg Oral Q12H Nicholaus Bloom, MD   20 mg at 06/27/14 4332  . pregabalin (LYRICA) capsule 50 mg  50 mg Oral QID Nicholaus Bloom, MD   50 mg at 06/27/14 1209  . thiamine (VITAMIN B-1) tablet 100 mg  100 mg Oral Daily Nicholaus Bloom, MD   100 mg at 06/27/14 0820  . traZODone (DESYREL) tablet 200 mg  200 mg Oral QHS Waylan Boga, NP   200 mg at 06/26/14 2259    Lab Results: No results found for this or any previous visit (from the past 48 hour(s)).  Physical Findings: AIMS: Facial and Oral Movements Muscles of Facial Expression: None, normal Lips and Perioral Area: None, normal Jaw: None, normal Tongue: None, normal,Extremity Movements Upper (arms, wrists, hands, fingers): None, normal Lower (legs, knees, ankles, toes): None, normal, Trunk Movements Neck, shoulders, hips: None, normal, Overall Severity Severity of abnormal movements (highest score from questions above): None, normal Incapacitation due to abnormal movements: None, normal Patient's awareness of abnormal movements (rate only patient's report): No Awareness, Dental Status Current problems with teeth and/or dentures?: No Does patient usually wear dentures?: No  CIWA:  CIWA-Ar Total: 4 COWS:  COWS Total Score: 6  Treatment Plan Summary: Daily contact with patient to assess and evaluate symptoms and progress in treatment Medication management  Plan: Supportive approach/coping  skills/relapse prevention           Alcohol Dependence; continue the detox protocol           Depression: states the Prozac has helped with the depression, feels that the depression right now is more circumstantial.             Pain: will increase the Lyrica to 50 mg QID            Will continue to explore placement options Medical Decision Making Problem Points:  Established problem, worsening (2) and Review of psycho-social stressors (1) Data Points:  Review of medication regiment & side effects (2) Review of new medications or change in dosage (2)  I certify that inpatient services furnished can reasonably be expected to improve  the patient's condition.   Steven Cappuccio,MD 06/27/2014, 1:23 PM

## 2014-06-27 NOTE — Progress Notes (Addendum)
D:  Patient's self inventory sheet, patient sleeps good, sleep medication is helpful.  Fair appetite, low energy level, concentration improved.  Withdrawals of tremors, cramping.  Denied SI.  Physical problems, pain, headaches, leg and back chronic pain.  Worst pain #6, head, legs, back.   Pain medication is helpful.  Goal is to clear mind of fog, working on getting his life back.   A:  Medications administered per MD orders.  Emotional support and encouragement given patient. R:  Denied SI and HI, contracts for safety.  Denied A/V hallucinations.  Safety maintained with 15 minute checks.  Patient has been playing cards in dayroom with peers, laughing, talking.  No signs/symptoms of pain/distress noted on patient's face/body movements.  Patient stated his back pain continues.  Safety maintained with 15 minute checks.

## 2014-06-27 NOTE — Clinical Social Work Note (Signed)
CSW attempted to contact [facility] [phone]) but no answer and no voicemail in place.  CSW Erasmo Downer to continue seeking placement.    Regan Lemming, LCSW 06/27/2014  11:36 AM

## 2014-06-27 NOTE — Plan of Care (Signed)
Problem: Consults Goal: Depression Patient Education See Patient Education Module for education specifics.  Outcome: Completed/Met Date Met:  06/27/14 Nurse discussed depression with patient.

## 2014-06-27 NOTE — Tx Team (Signed)
  Interdisciplinary Treatment Plan Update   Date Reviewed:  06/27/2014  Time Reviewed:  10:36 AM  Progress in Treatment:   Attending groups: Yes Participating in groups: Yes Taking medication as prescribed: Yes  Tolerating medication: Yes Family/Significant other contact made: Yes  Patient understands diagnosis: Yes  Discussing patient identified problems/goals with staff: Yes  See initial care plan Medical problems stabilized or resolved: Yes Denies suicidal/homicidal ideation: Yes  In tx team Patient has not harmed self or others: Yes  For review of initial/current patient goals, please see plan of care.  Estimated Length of Stay:  4-5 days  Reason for Continuation of Hospitalization: Anxiety Depression Medical Issues Medication stabilization  New Problems/Goals identified:  N/A  Discharge Plan or Barriers:   Plans to go to a [facility]  Additional Comments:  Steven Wiley states he gets very upset very depressed, down on himself, angry with the circumstances. He states he deals with the pain ( states worst due to the rain) everyday. States this sets the stage for how his mood is going to be. Right now dealing with the uncertainty of where to go from here. Upset as he states he is not going to be able to make without a stable place to live. Is feeling more hopeful since meeting [facility] person yesterday who evaluated him for [facility].  Feels good about this, and is aware that he will be forfeiting his check.  Attendees:  Signature: Corena Pilgrim, MD 06/27/2014 10:36 AM   Signature: Ripley Fraise, LCSW 06/27/2014 10:36 AM  Signature:  06/27/2014 10:36 AM  Signature: Faylene Kurtz, RN 06/27/2014 10:36 AM  Signature: Grayland Ormond, RN 06/27/2014 10:36 AM  Signature:  06/27/2014 10:36 AM  Signature:   06/27/2014 10:36 AM  Signature:    Signature:    Signature:    Signature:    Signature:    Signature:      Scribe for Treatment Team:   Ripley Fraise, LCSW  06/27/2014 10:36 AM

## 2014-06-27 NOTE — BHH Group Notes (Signed)
[facility] LCSW Aftercare Discharge Planning Group Note   06/27/2014 10:43 AM  Participation Quality:  Engaged  Mood/Affect:  Flat  Depression Rating:  7  Anxiety Rating:  7  Thoughts of Suicide:  No Will you contract for safety?   NA  Current AVH:  No  Plan for Discharge/Comments:  Spent time c/o neuropathy and pain.  States both depression and anxiety are coming down, and became more animated when talking about meeting with [employer] assessor yesterday.  Looking forward to getting into [facility].  Understands that he will have to forfeit check.  Transportation Means:   Supports:  Roque Lias B

## 2014-06-28 DIAGNOSIS — F1020 Alcohol dependence, uncomplicated: Secondary | ICD-10-CM | POA: Insufficient documentation

## 2014-06-28 DIAGNOSIS — F332 Major depressive disorder, recurrent severe without psychotic features: Secondary | ICD-10-CM | POA: Insufficient documentation

## 2014-06-28 NOTE — Progress Notes (Signed)
Patient ID: Steven MCPARLAND Sr., male   DOB: 1955/10/29, 59 y.o.   MRN: [id number] [facility] MD Progress Note  06/28/2014 1:32 PM Steven Schroeder Sr.  MRN:  [id number] Subjective:  Patient seen and chart reviewed. He is endorsing decreased  anxiety, worries, low energy level and depressive symptoms. Patient rates his anxiety as 6/10 and depression 7/10 today. Patient continues to deny suicidal or homicidal ideation, intent or plan. He continues to report dealing with chronic pain which prevents him from sleeping, says he wakes up at least 3x every night but was able to go back to sleep. Right now dealing with the uncertainty of where to go after he is discharged  from the hospital. Patient denies delusional thinking or psychosis. Diagnosis:   DSM5: Substance/Addictive Disorders:  Alcohol Related Disorder - Moderate (303.90) Depressive Disorders:  Major Depressive Disorder - Severe (296.23) Total Time spent with patient: 25 minutes  Axis I: Substance Induced Mood Disorder  ADL's:  Intact  Sleep: Fair  Appetite:  Fair  Psychiatric Specialty Exam: Physical Exam  Review of Systems  Constitutional: Positive for malaise/fatigue.  HENT: Negative.   Eyes: Negative.   Respiratory: Negative.   Cardiovascular: Negative.   Gastrointestinal: Negative.   Genitourinary: Negative.   Musculoskeletal: Positive for back pain and joint pain.  Skin: Negative.   Neurological: Positive for weakness.  Endo/Heme/Allergies: Negative.   Psychiatric/Behavioral: Positive for depression and substance abuse. The patient is nervous/anxious.     Blood pressure 120/81, pulse 75, temperature 97.8 F (36.6 C), temperature source Oral, resp. rate 16, height 5\' 8"  (1.727 m), weight 97.297 kg (214 lb 8 oz).Body mass index is 32.62 kg/(m^2).  General Appearance: Fairly Groomed  Engineer, water::  Fair  Speech:  Clear and Coherent  Volume:  Decreased  Mood:  Anxious, Depressed and Dysphoric  Affect:  sad, worried  Thought Process:   Coherent and Goal Directed  Orientation:  Full (Time, Place, and Person)  Thought Content:  symptoms events worries concerns  Suicidal Thoughts:  No  Homicidal Thoughts:  No  Memory:  Immediate;   Fair Recent;   Fair Remote;   Fair  Judgement:  Fair  Insight:  Present  Psychomotor Activity:  Restlessness  Concentration:  Fair  Recall:  [employer] [place]: Fair  Akathisia:  No  Handed:    AIMS (if indicated):     Assets:  Desire for Improvement  Sleep:  Number of Hours: 5.5   Musculoskeletal: Strength & Muscle Tone: within normal limits Gait & Station: normal Patient leans: N/A  Current Medications: Current Facility-Administered Medications  Medication Dose Route Frequency Provider Last Rate Last Dose  . Aclidinium Bromide AEPB 1 puff  1 puff Inhalation BID Waylan Boga, NP   1 puff at 06/28/14 0831  . albuterol (PROVENTIL) (2.5 MG/3ML) 0.083% nebulizer solution 2.5 mg  2.5 mg Nebulization Q6H PRN Waylan Boga, NP      . alum & mag hydroxide-simeth (MAALOX/MYLANTA) 200-200-20 MG/5ML suspension 30 mL  30 mL Oral Q4H PRN Waylan Boga, NP      . arformoterol [facility]) nebulizer solution 15 mcg  15 mcg Nebulization QHS Nicholaus Bloom, MD   15 mcg at 06/25/14 2200  . budesonide (PULMICORT) nebulizer solution 0.25 mg  0.25 mg Nebulization QHS Nicholaus Bloom, MD   0.25 mg at 06/25/14 2200  . dicyclomine (BENTYL) tablet 20 mg  20 mg Oral Q6H PRN Shuvon Rankin, NP      . FLUoxetine (PROZAC) capsule  40 mg  40 mg Oral Daily Waylan Boga, NP   40 mg at 06/28/14 0830  . Ipratropium-Albuterol (COMBIVENT) respimat 1 puff  1 puff Inhalation BID Waylan Boga, NP   1 puff at 06/28/14 0831  . magnesium hydroxide (MILK OF MAGNESIA) suspension 30 mL  30 mL Oral Daily PRN Waylan Boga, NP      . methocarbamol (ROBAXIN) tablet 500 mg  500 mg Oral Q8H PRN Shuvon Rankin, NP   500 mg at 06/25/14 0640  . multivitamin with minerals tablet 1 tablet  1 tablet Oral Daily Nicholaus Bloom, MD    1 tablet at 06/28/14 0830  . naproxen (NAPROSYN) tablet 500 mg  500 mg Oral BID PRN Shuvon Rankin, NP      . nicotine (NICODERM CQ - dosed in mg/24 hours) patch 14 mg  14 mg Transdermal Daily Waylan Boga, NP   14 mg at 06/28/14 0831  . oxyCODONE (Oxy IR/ROXICODONE) immediate release tablet 15 mg  15 mg Oral Q6H PRN Nicholaus Bloom, MD   15 mg at 06/28/14 1305  . OxyCODONE (OXYCONTIN) 12 hr tablet 20 mg  20 mg Oral Q12H Nicholaus Bloom, MD   20 mg at 06/28/14 0830  . pregabalin (LYRICA) capsule 50 mg  50 mg Oral QID Nicholaus Bloom, MD   50 mg at 06/28/14 1304  . thiamine (VITAMIN B-1) tablet 100 mg  100 mg Oral Daily Nicholaus Bloom, MD   100 mg at 06/28/14 0830  . traZODone (DESYREL) tablet 200 mg  200 mg Oral QHS Waylan Boga, NP   200 mg at 06/27/14 2137    Lab Results: No results found for this or any previous visit (from the past 48 hour(s)).  Physical Findings: AIMS: Facial and Oral Movements Muscles of Facial Expression: None, normal Lips and Perioral Area: None, normal Jaw: None, normal Tongue: None, normal,Extremity Movements Upper (arms, wrists, hands, fingers): None, normal Lower (legs, knees, ankles, toes): None, normal, Trunk Movements Neck, shoulders, hips: None, normal, Overall Severity Severity of abnormal movements (highest score from questions above): None, normal Incapacitation due to abnormal movements: None, normal Patient's awareness of abnormal movements (rate only patient's report): No Awareness, Dental Status Current problems with teeth and/or dentures?: No Does patient usually wear dentures?: No  CIWA:  CIWA-Ar Total: 1 COWS:  COWS Total Score: 1  Treatment Plan Summary: Daily contact with patient to assess and evaluate symptoms and progress in treatment Medication management  Plan: Supportive approach/coping skills/relapse prevention           Alcohol Dependence; continue the detox protocol           Depression: states the Prozac has helped with the depression,  feels that the depression right now is more circumstantial.             Pain: will increase the Lyrica to 50 mg QID            Will continue to explore placement options Medical Decision Making Problem Points:  Established problem, improving (1) and Review of psycho-social stressors (1) Data Points:  Review of medication regiment & side effects (2) Review of new medications or change in dosage (2)  I certify that inpatient services furnished can reasonably be expected to improve the patient's condition.   Timofey Carandang,MD 06/28/2014, 1:32 PM

## 2014-06-28 NOTE — B[facility] Group Notes (Signed)
[place] LCSW Group Therapy  06/28/2014 1:15 PM  Type of Therapy:  Group Therapy  Participation Level:  Active  Participation Quality:  Appropriate  Affect:  Appropriate  Cognitive:  Appropriate and Oriented  Insight:  Developing/Improving  Engagement in Therapy:  Appropriate  Modes of Intervention:  Exploration, Rapport Building, Socialization and Support  Summary of Progress/Problems: Summary of Progress/Problems: The main focus of today's process group was for the patient to identify ways in which they have in the past sabotaged their own recovery. Motivational Interviewing was utilized to ask the group members what they get out of their self sabotage, and what reasons they may have for wanting to change. The patient expressed that he has self sabotaged for rationalizing his substance use with legitimate chronic pain issues. He also cites isolation and not asking for help as self sabotagers. He was in agreement with others that symptoms seem to worsen with isolation. Edin spoke highly of a previous sponsor's devotion to recovery and shared his ideal would be to do the same.   Lyla Glassing 06/28/2014

## 2014-06-28 NOTE — Progress Notes (Signed)
Psychoeducational Group Note  Date:  02/04/2012 Time: 1015  Group Topic/Focus:  Identifying Needs:   The focus of this group is to help patients identify their personal needs that have been historically problematic and identify healthy behaviors to address their needs.  Participation Level:  Passive  Participation Quality: Minimal  Affect: flat  Cognitive:  Intact  Insight:  Some  Engagement in Group:   Additional Comments:   PD RN [facility]

## 2014-06-28 NOTE — Progress Notes (Signed)
Adult Psychoeducational Group Note  Date:  06/28/2014 Time:  3:30PM  Group Topic/Focus:  Making Healthy Choices:   The focus of this group is to help patients identify negative/unhealthy choices they were using prior to admission and identify positive/healthier coping strategies to replace them upon discharge.  Participation Level:  Active  Participation Quality:  Appropriate  Affect:  Appropriate  Cognitive:  Appropriate  Insight: Appropriate  Engagement in Group:  Engaged  Modes of Intervention:  Discussion  Additional Comments:  Pt was active throughout group. Pt identified the "ABC's of Leisure" with peers and staff, naming a positive leisure activity for every letter of the alphabet   Lindon Kiel, Robley Fries 06/28/2014, 4:03 PM

## 2014-06-28 NOTE — Plan of Care (Signed)
Problem: Alteration in mood Goal: STG-Patient is able to discuss feelings and issues (Patient is able to discuss feelings and issues leading to depression)  Outcome: Progressing Pt alerts staff when increase anxiety and discusses issues that leads to depression.

## 2014-06-28 NOTE — Progress Notes (Signed)
Pt alert and cooperative. Affect/mood anxious and depressed. -SI/HI, verbally contracts for safety. -A/[employer] with peers and attended group. C/o back pain, see MAR. Worried about where he will go after discharge and getting into a assistant living facility. Refused nebulizer treatment "it keeps me up". Emotional support and encouragement given. Will continue to monitor closely and evaluate for stabilization.

## 2014-06-28 NOTE — Progress Notes (Signed)
D: Pt denies SI/HI/AVH. Pt is pleasant and cooperative. Pt wanted to take his nebulizer Tx in the morning because he said it keeps him up at night. Pt stated he had a good day, with good groups. Pt continues to be focused on his medication, but  pt appears to be interested in his plan of care and hid  A: Pt was offered support and encouragement. Pt was given scheduled medications. Pt was encourage to attend groups. Q 15 minute checks were done for safety.   R:Pt attends groups and interacts well with peers and staff. Pt is taking medication. Pt has no complaints at this time .Pt receptive to treatment and safety maintained on unit.

## 2014-06-29 MED ORDER — TUBERCULIN PPD 5 UNIT/0.1ML ID SOLN
5.0000 [IU] | Freq: Once | INTRADERMAL | Status: AC
  Administered 2014-06-29: 5 [IU] via INTRADERMAL

## 2014-06-29 NOTE — Progress Notes (Signed)
D: Pt denies SI/HI/AVH. Pt is pleasant and cooperative.  Pt stated he had a good day, with good groups, and talking to other patients on the unit. Pt continues to be focused on his medication.   A: Pt was offered support and encouragement. Pt was given scheduled medications. Pt was encourage to attend groups. Q 15 minute checks were done for safety. Pt given PPD shot on L-forearm needs to be read between 1/5-1/6  R:Pt attends groups and interacts well with peers and staff. Pt is taking medication. Pt has no complaints at this time .Pt receptive to treatment and safety maintained on unit.

## 2014-06-29 NOTE — Progress Notes (Signed)
The patient attended the A. A. Meeting last evening and was appropriate.

## 2014-06-29 NOTE — Progress Notes (Signed)
Psychoeducational Group Note  Date:  06/29/2014 Time:  1015  Group Topic/Focus:  Making Healthy Choices:   The focus of this group is to help patients identify negative/unhealthy choices they were using prior to admission and identify positive/healthier coping strategies to replace them upon discharge.  Participation Level:  Active  Participation Quality:  Appropriate  Affect:  Appropriate  Cognitive:  Oriented  Insight:  Improving  Engagement in Group:  Engaged  Additional Comments:  Pt participated and added much to the group.  Paulino Rily 06/29/2014

## 2014-06-29 NOTE — Progress Notes (Signed)
Steven Wiley remains quiet, depressed and focused on his pain meds. HE gets easily sob...when he gets OOB and  is walking around  .    A He requests and is given oxycodone IR, for c/o back  Pain  at 1156 and says to this nurse as he receives these medications..." I hope 'm not going backwards ". He completes his morning assessment  And on it he writes he denies SI within the past 24 hrs and he rates his depression and anxiety "8/1".  ````````````````````R safety in place and poc mobi

## 2014-06-29 NOTE — B[facility] Group Notes (Signed)
[place] LCSW Group Therapy  06/29/2014 1:15 PM  Type of Therapy:  Group Therapy  Participation Level:  Active for a few minutes before falling asleep  Participation Quality:  Appropriate and Drowsy  Affect:  Depressed  Cognitive:  Oriented  Insight:  Limited  Engagement in Therapy:  Limited  Modes of Intervention:  Discussion, Exploration, Orientation, Socialization and Support  Summary of Progress/Problems: The main focus of today's process group was to identify the patient's current support system and decide on other supports that can be put in place. An emphasis was placed on using counselor, doctor, therapy groups, 12-step groups, and problem-specific support groups to expand supports. There was also an extensive discussion about what constitutes a healthy support versus an unhealthy support. The patient shared feelings of abandonment and rejection in response judgements from family members. Patient feel asleep and dosed for remainder of group after sharing early in session.   Sheilah Pigeon, LCSW

## 2014-06-29 NOTE — Progress Notes (Signed)
Patient ID: Steven LLERA Sr., male   DOB: [birth year], 59 y.o.   MRN: [id number] Patient ID: Steven Ramakrishnan., male   DOB: [birth year], 59 y.o.   MRN: [id number] [facility] MD Progress Note  06/29/2014 1:19 PM Steven Schroeder Sr.  MRN:  [id number] Subjective:  Patient seen and chart reviewed. He is up and active on the unit and in the milieu.  He reports that he is attending groups and is benefiting from them. He denies SI/HI or AVH. He rates his depression as a 5-7/10 and his anxiety is also 7/10.  He states that he continues to have tremors from alcohol withdrawal, but his CIWA is 0.  He also complains of chronic pain in his back and joints, but does not ask for pain meds.      He continues to worry about where he will go and what he will do upon discharge and notes that he does not have a place to live, but that the "SW" are working on it.    Diagnosis:   DSM5: Substance/Addictive Disorders:  Alcohol Related Disorder - Moderate (303.90) Depressive Disorders:  Major Depressive Disorder - Severe (296.23) Total Time spent with patient: 25 minutes  Axis I: Substance Induced Mood Disorder  ADL's:  Intact  Sleep: Fair  Appetite:  Fair  Psychiatric Specialty Exam: Physical Exam  Review of Systems  Constitutional: Positive for malaise/fatigue.  HENT: Negative.   Eyes: Negative.   Respiratory: Negative.   Cardiovascular: Negative.   Gastrointestinal: Negative.   Genitourinary: Negative.   Musculoskeletal: Positive for back pain and joint pain.  Skin: Negative.   Neurological: Positive for weakness.  Endo/Heme/Allergies: Negative.   Psychiatric/Behavioral: Positive for depression and substance abuse. The patient is nervous/anxious.     Blood pressure 120/59, pulse 85, temperature 98.2 F (36.8 C), temperature source Oral, resp. rate 16, height 5\' 8"  (1.727 m), weight 97.297 kg (214 lb 8 oz).Body mass index is 32.62 kg/(m^2).  General Appearance: Fairly Groomed  Engineer, water::  Fair  Speech:   Clear and Coherent  Volume:  Decreased  Mood:  Anxious, Depressed and Dysphoric  Affect:  sad, worried  Thought Process:  Coherent and Goal Directed  Orientation:  Full (Time, Place, and Person)  Thought Content:  symptoms events worries concerns  Suicidal Thoughts:  No  Homicidal Thoughts:  No  Memory:  Immediate;   Fair Recent;   Fair Remote;   Fair  Judgement:  Fair  Insight:  Present  Psychomotor Activity:  Restlessness  Concentration:  Fair  Recall:  [employer] [place]: Fair  Akathisia:  No  Handed:    AIMS (if indicated):     Assets:  Desire for Improvement  Sleep:  Number of Hours: 6.5   Musculoskeletal: Strength & Muscle Tone: within normal limits Gait & Station: normal Patient leans: N/A  Current Medications: Current Facility-Administered Medications  Medication Dose Route Frequency Provider Last Rate Last Dose  . Aclidinium Bromide AEPB 1 puff  1 puff Inhalation BID Waylan Boga, NP   1 puff at 06/29/14 1002  . albuterol (PROVENTIL) (2.5 MG/3ML) 0.083% nebulizer solution 2.5 mg  2.5 mg Nebulization Q6H PRN Waylan Boga, NP      . alum & mag hydroxide-simeth (MAALOX/MYLANTA) 200-200-20 MG/5ML suspension 30 mL  30 mL Oral Q4H PRN Waylan Boga, NP      . arformoterol (BROVANA) nebulizer solution 15 mcg  15 mcg Nebulization QHS Nicholaus Bloom, MD   15 mcg at  06/29/14 1004  . budesonide (PULMICORT) nebulizer solution 0.25 mg  0.25 mg Nebulization QHS Nicholaus Bloom, MD   0.25 mg at 06/29/14 1005  . dicyclomine (BENTYL) tablet 20 mg  20 mg Oral Q6H PRN Shuvon Rankin, NP      . FLUoxetine (PROZAC) capsule 40 mg  40 mg Oral Daily Waylan Boga, NP   40 mg at 06/29/14 1004  . Ipratropium-Albuterol (COMBIVENT) respimat 1 puff  1 puff Inhalation BID Waylan Boga, NP   1 puff at 06/29/14 1003  . magnesium hydroxide (MILK OF MAGNESIA) suspension 30 mL  30 mL Oral Daily PRN Waylan Boga, NP      . methocarbamol (ROBAXIN) tablet 500 mg  500 mg Oral Q8H PRN Shuvon  Rankin, NP   500 mg at 06/25/14 0640  . multivitamin with minerals tablet 1 tablet  1 tablet Oral Daily Nicholaus Bloom, MD   1 tablet at 06/29/14 1004  . naproxen (NAPROSYN) tablet 500 mg  500 mg Oral BID PRN Shuvon Rankin, NP      . nicotine (NICODERM CQ - dosed in mg/24 hours) patch 14 mg  14 mg Transdermal Daily Waylan Boga, NP   14 mg at 06/29/14 1006  . oxyCODONE (Oxy IR/ROXICODONE) immediate release tablet 15 mg  15 mg Oral Q6H PRN Nicholaus Bloom, MD   15 mg at 06/29/14 1156  . OxyCODONE (OXYCONTIN) 12 hr tablet 20 mg  20 mg Oral Q12H Nicholaus Bloom, MD   20 mg at 06/29/14 1007  . pregabalin (LYRICA) capsule 50 mg  50 mg Oral QID Nicholaus Bloom, MD   50 mg at 06/29/14 1157  . thiamine (VITAMIN B-1) tablet 100 mg  100 mg Oral Daily Nicholaus Bloom, MD   100 mg at 06/29/14 0800  . traZODone (DESYREL) tablet 200 mg  200 mg Oral QHS Waylan Boga, NP   200 mg at 06/28/14 2130    Lab Results: No results found for this or any previous visit (from the past 48 hour(s)).  Physical Findings: AIMS: Facial and Oral Movements Muscles of Facial Expression: None, normal Lips and Perioral Area: None, normal Jaw: None, normal Tongue: None, normal,Extremity Movements Upper (arms, wrists, hands, fingers): None, normal Lower (legs, knees, ankles, toes): None, normal, Trunk Movements Neck, shoulders, hips: None, normal, Overall Severity Severity of abnormal movements (highest score from questions above): None, normal Incapacitation due to abnormal movements: None, normal Patient's awareness of abnormal movements (rate only patient's report): No Awareness, Dental Status Current problems with teeth and/or dentures?: No Does patient usually wear dentures?: No  CIWA:  CIWA-Ar Total: 0 COWS:  COWS Total Score: 0  Treatment Plan Summary: Daily contact with patient to assess and evaluate symptoms and progress in treatment Medication management  Plan: 1. Ordered PPD per communication notes. 2. No change in  medication, but would consider d/cing or tapering down his use of narcotics due to hx of abuse and non-compliance.   Medical Decision Making Problem Points:  Established problem, improving (1) and Review of psycho-social stressors (1) Data Points:  Review of medication regiment & side effects (2) Review of new medications or change in dosage (2)  I certify that inpatient services furnished can reasonably be expected to improve the patient's condition.  Marlane Hatcher. [place] RPAC 1:41 PM 06/29/2014  Patient seen, evaluated and I agree with notes by Nurse Practitioner. Corena Pilgrim, MD

## 2014-06-29 NOTE — Progress Notes (Signed)
Patient did attend the evening speaker AA meeting.  

## 2014-06-29 NOTE — Plan of Care (Signed)
Problem: Ineffective individual coping Goal: LTG: Patient will report a decrease in negative feelings Outcome: Progressing Pt denies SI/HI/ AVH at this time, pt stated he had a good day with good groups Goal: STG: Patient will remain free from self harm Outcome: Progressing Pt safe on unit with no harm Aeb documentation

## 2014-06-30 NOTE — Progress Notes (Addendum)
D:  Patient's self inventory sheet, patient sleeps good, sleep medication is helpful.   Fair appetite, low energy level, better concentration today than yesterday.  Rated depression 10, hopeless and anxiety 8.  Continues to experience tremors.  Denied SI .  Has physical problems of back pain #8.  Goal is to have more positive attitudes, work on recovery.  Plans to stay focused on needs and not wants.  Tremors continue.  Not sure of discharge plans.  Needs financial assistance with medications. A:  Medications administered per MD orders.  Emotional support and encouragement given patient. R:  Denied SI and HI.  Contracts for safety.  Denied A/V hallucinations.  Safety maintained with 15 minute checks.

## 2014-06-30 NOTE — Clinical Social Work Note (Addendum)
CSW spoke with patient's son, Steven Wiley at [phone].  He advised that he would not be able to take patient into his home at discharge.  He advised he and patient have not had a good relationship in the past and that he does not have room to bring him into his home.  He stated he would talk with patient's brother, Steven Wiley, but does believe he will allow patient to stay in his home.  Patient's son to call CSW back after speaking with his uncle.  CSW sent patient's FL2 to [facility] [place] but no response.  Patient advises he was in an assisted living facility but the check continued to come to him.  He advised he is currently not receiving any financial assistance.  Message left for Steven Wiley to see if he would be willing to work with a patient that does not have income at this time.  Awaiting call back.  Message also left for patient's girlfriend to see if he would be able to stay with her.  Patient's girlfriend called advising she lives and senior housing, therefore, patient would not be able to live with her.  CSW followed up with patient who stated he has been in a shelter before and does not want to live in one at discharge.  Call back from Steven Wiley advising he would take patient into his place but he does not have any openings at this time.

## 2014-06-30 NOTE — Progress Notes (Signed)
[facility] MD Progress Note  06/30/2014 4:53 PM Steven Schroeder Sr.  MRN:  [id number] Subjective:  Steven Wiley endorses that he still does not know where he is going to go from here. States the weekend went well but that once he woke up this AM and knew he was going to be asked about where was he going to go, he started having Wiley lot of anxiety, worry. States he cant count on any of his family members, does not have the funds to pay for Wiley place right now. He is concerned with his medical conditions of staying out in the streets or [facility] shelter Diagnosis:   DSM5: Substance/Addictive Disorders:  Alcohol Related Disorder - Moderate (303.90) Depressive Disorders:  Major Depressive Disorder - Severe (296.23) Total Time spent with patient: 30 minutes  Axis I: Generalized Anxiety Disorder  ADL's:  Intact  Sleep: Fair  Appetite:  Fair Psychiatric Specialty Exam: Physical Exam  Review of Systems  Constitutional: Negative.   HENT: Negative.   Eyes: Negative.   Respiratory: Negative.   Cardiovascular: Negative.   Gastrointestinal: Negative.   Genitourinary: Negative.   Musculoskeletal: Positive for back pain and joint pain.  Skin: Negative.   Neurological: Negative.   Endo/Heme/Allergies: Negative.   Psychiatric/Behavioral: Positive for depression and suicidal ideas. The patient is nervous/anxious.     Blood pressure 121/74, pulse 87, temperature 98.6 F (37 C), temperature source Oral, resp. rate 16, height 5\' 8"  (1.727 m), weight 97.297 kg (214 lb 8 oz).Body mass index is 32.62 kg/(m^2).  General Appearance: Fairly Groomed  Engineer, water::  Fair  Speech:  Clear and Coherent  Volume:  Decreased  Mood:  Anxious and worried  Affect:  anxious worried  Thought Process:  Coherent and Goal Directed  Orientation:  Full (Time, Place, and Person)  Thought Content:  worry about where to go from here  Suicidal Thoughts:  No  Homicidal Thoughts:  No  Memory:  Immediate;   Fair Recent;   Fair Remote;   Fair   Judgement:  Fair  Insight:  Present  Psychomotor Activity:  Restlessness  Concentration:  Fair  Recall:  [employer] of Knowledge:Fair  Language: Fair  Akathisia:  No  Handed:    AIMS (if indicated):     Assets:  Desire for Improvement  Sleep:  Number of Hours: 6.75   Musculoskeletal: Strength & Muscle Tone: within normal limits Gait & Station: normal Patient leans: N/Wiley  Current Medications: Current Facility-Administered Medications  Medication Dose Route Frequency Provider Last Rate Last Dose  . Aclidinium Bromide AEPB 1 puff  1 puff Inhalation BID Waylan Boga, NP   1 puff at 06/30/14 1647  . albuterol (PROVENTIL) (2.5 MG/3ML) 0.083% nebulizer solution 2.5 mg  2.5 mg Nebulization Q6H PRN Waylan Boga, NP      . alum & mag hydroxide-simeth (MAALOX/MYLANTA) 200-200-20 MG/5ML suspension 30 mL  30 mL Oral Q4H PRN Waylan Boga, NP      . arformoterol (BROVANA) nebulizer solution 15 mcg  15 mcg Nebulization QHS Nicholaus Bloom, MD   15 mcg at 06/29/14 1004  . budesonide (PULMICORT) nebulizer solution 0.25 mg  0.25 mg Nebulization QHS Nicholaus Bloom, MD   0.25 mg at 06/29/14 1005  . FLUoxetine (PROZAC) capsule 40 mg  40 mg Oral Daily Waylan Boga, NP   40 mg at 06/30/14 0813  . Ipratropium-Albuterol (COMBIVENT) respimat 1 puff  1 puff Inhalation BID Waylan Boga, NP   1 puff at 06/30/14 [phone]  . magnesium  hydroxide (MILK OF MAGNESIA) suspension 30 mL  30 mL Oral Daily PRN Waylan Boga, NP      . multivitamin with minerals tablet 1 tablet  1 tablet Oral Daily Nicholaus Bloom, MD   1 tablet at 06/30/14 0813  . nicotine (NICODERM CQ - dosed in mg/24 hours) patch 14 mg  14 mg Transdermal Daily Waylan Boga, NP   14 mg at 06/30/14 0814  . oxyCODONE (Oxy IR/ROXICODONE) immediate release tablet 15 mg  15 mg Oral Q6H PRN Nicholaus Bloom, MD   15 mg at 06/30/14 1124  . OxyCODONE (OXYCONTIN) 12 hr tablet 20 mg  20 mg Oral Q12H Nicholaus Bloom, MD   20 mg at 06/30/14 0815  . pregabalin (LYRICA) capsule 50 mg   50 mg Oral QID Nicholaus Bloom, MD   50 mg at 06/30/14 1649  . thiamine (VITAMIN B-1) tablet 100 mg  100 mg Oral Daily Nicholaus Bloom, MD   100 mg at 06/30/14 0816  . traZODone (DESYREL) tablet 200 mg  200 mg Oral QHS Waylan Boga, NP   200 mg at 06/29/14 2159  . tuberculin injection 5 Units  5 Units Intradermal Once Nena Polio, PA-C   5 Units at 06/29/14 1958    Lab Results: No results found for this or any previous visit (from the past 67 hour(s)).  Physical Findings: AIMS: Facial and Oral Movements Muscles of Facial Expression: None, normal Lips and Perioral Area: None, normal Jaw: None, normal Tongue: None, normal,Extremity Movements Upper (arms, wrists, hands, fingers): None, normal Lower (legs, knees, ankles, toes): None, normal, Trunk Movements Neck, shoulders, hips: None, normal, Overall Severity Severity of abnormal movements (highest score from questions above): None, normal Incapacitation due to abnormal movements: None, normal Patient's awareness of abnormal movements (rate only patient's report): No Awareness, Dental Status Current problems with teeth and/or dentures?: No Does patient usually wear dentures?: No  CIWA:  CIWA-Ar Total: 2 COWS:  COWS Total Score: 3  Treatment Plan Summary: Daily contact with patient to assess and evaluate symptoms and progress in treatment Medication management  Plan: Supportive approach/coping skills/relapse prevention           Depression; continue the Prozac           Pain will have to reassess pain management with opioids if he would not have Wiley stable place to go           Explore other placement options Medical Decision Making Problem Points:  Review of psycho-social stressors (1) Data Points:  Review of medication regiment & side effects (2)  I certify that inpatient services furnished can reasonably be expected to improve the patient's condition.   Steven Wiley 06/30/2014, 4:53 PM

## 2014-06-30 NOTE — BHH Group Notes (Signed)
[facility] LCSW Aftercare Discharge Planning Group Note   06/30/2014 11:53 AM  Participation Quality:  Patient remained in bed - did not attend group.  Steven Wiley, Eulas Post

## 2014-06-30 NOTE — Progress Notes (Signed)
Adult Psychoeducational Group Note  Date:  06/30/2014 Time:  1015  Group Topic/Focus:  Self Care:   The focus of this group is to help patients understand the importance of self-care in order to improve or restore emotional, physical, spiritual, interpersonal, and financial health.  Participation Level:  Active  Participation Quality:  Appropriate  Affect:  Appropriate  Cognitive:  Alert and Appropriate  Insight: Appropriate  Engagement in Group:  Engaged  Modes of Intervention:  Discussion and Education  Additional Comments:  Pt. attended and participated in group discussion focused on self-care geared towards strength and weakness and barriers  [name] 06/30/2014, 1:46 PM

## 2014-06-30 NOTE — Plan of Care (Signed)
Problem: Consults Goal: [facility] General Treatment Patient Education Outcome: Completed/Met Date Met:  06/30/14 Nurse discussed treatment plan with patient.

## 2014-06-30 NOTE — BHH Group Notes (Signed)
[facility] LCSW Group Therapy          Overcoming Obstacles       1:15 -2:30        06/30/2014   3:00 PM     Type of Therapy:  Group Therapy  Participation Level:  Appropriate  Participation Quality:  Appropriate  Affect:  Appropriate, Alert  Cognitive:  Attentive Appropriate  Insight: Developing/Improving Engaged  Engagement in Therapy: Developing/Imprvoing Engaged  Modes of Intervention:  Discussion Exploration  Education Rapport BuildingProblem-Solving Support  Summary of Progress/Problems:  The main focus of today's group was overcoming obstacles.  He advised the obstacle he has to overcome is learning to live in the present.  He stated he has been very self centered and egotistical and finds himself in a situation where he does not know what to do.  Patient able to identify appropriate coping skills including being more considerate of others.   Steven Wiley 06/30/2014   3:00 PM

## 2014-07-01 NOTE — Progress Notes (Addendum)
Patient ID: Steven STANDAGE Sr., male   DOB: 1956/05/01, 59 y.o.   MRN: [id number] D: Client visible on milieu in dayroom watching TV and interacting with peers. Client reports depression at "8" of 10 and pain in back at "7" "but I'm still blessed" A: Probation officer provided emotional support, administered Oxycodone 20 mg for pain. Staff will monitor q9min for safety. R: client is safe on the unit.

## 2014-07-01 NOTE — Progress Notes (Signed)
[facility] MD Progress Note  07/01/2014 4:29 PM Calvert.  MRN:  [id number] Subjective:  Steven Wiley has experienced increased anxiety not knowing where he is going to go from here. His son does not have the space and his brother does not seem to want to do anything with him. He had stated he has regular follow up for his pain management and understands he will have to have a follow appointment if he is going to continue to be on opioids. States that with his physical condition he does not think he can make it at a homeless shelter. States there is a friend that he has not contacted in a long time that might be willing to accommodate him while he clarifies and takes care of his financial situation. He will him too Diagnosis:   DSM5: Substance/Addictive Disorders:  Alcohol Related Disorder - Moderate (303.90) Depressive Disorders:  Major Depressive Disorder - Severe (296.23) Total Time spent with patient: 30 minutes  Axis I: Generalized Anxiety Disorder  ADL's:  Intact  Sleep: Fair  Appetite:  Fair  Psychiatric Specialty Exam: Physical Exam  Review of Systems  Constitutional: Positive for malaise/fatigue.  HENT: Negative.   Eyes: Negative.   Respiratory: Negative.   Cardiovascular: Negative.   Gastrointestinal: Negative.   Genitourinary: Negative.   Musculoskeletal: Positive for back pain and joint pain.  Skin: Negative.   Neurological: Positive for weakness.  Endo/Heme/Allergies: Negative.   Psychiatric/Behavioral: Positive for depression and substance abuse. The patient is nervous/anxious.     Blood pressure 128/78, pulse 81, temperature 98.8 F (37.1 C), temperature source Oral, resp. rate 18, height 5\' 8"  (1.727 m), weight 97.297 kg (214 lb 8 oz), SpO2 0 %.Body mass index is 32.62 kg/(m^2).  General Appearance: Fairly Groomed  Engineer, water::  Fair  Speech:  Clear and Coherent  Volume:  Normal  Mood:  Anxious and worried  Affect:  anxious worried  Thought Process:  Coherent and  Goal Directed  Orientation:  Full (Time, Place, and Person)  Thought Content:  symptoms events worries concerns  Suicidal Thoughts:  No  Homicidal Thoughts:  No  Memory:  Immediate;   Fair Recent;   Fair Remote;   Fair  Judgement:  Fair  Insight:  Present  Psychomotor Activity:  Restlessness  Concentration:  Fair  Recall:  [employer] of Knowledge:Fair  Language: Fair  Akathisia:  No  Handed:  Right  AIMS (if indicated):     Assets:  Desire for Improvement  Sleep:  Number of Hours: 6.25   Musculoskeletal: Strength & Muscle Tone: within normal limits Gait & Station: normal Patient leans: N/A  Current Medications: Current Facility-Administered Medications  Medication Dose Route Frequency Provider Last Rate Last Dose  . Aclidinium Bromide AEPB 1 puff  1 puff Inhalation BID Waylan Boga, NP   1 puff at 06/30/14 1647  . albuterol (PROVENTIL) (2.5 MG/3ML) 0.083% nebulizer solution 2.5 mg  2.5 mg Nebulization Q6H PRN Waylan Boga, NP   2.5 mg at 06/30/14 1653  . alum & mag hydroxide-simeth (MAALOX/MYLANTA) 200-200-20 MG/5ML suspension 30 mL  30 mL Oral Q4H PRN Waylan Boga, NP      . arformoterol (BROVANA) nebulizer solution 15 mcg  15 mcg Nebulization QHS Nicholaus Bloom, MD   15 mcg at 06/29/14 1004  . budesonide (PULMICORT) nebulizer solution 0.25 mg  0.25 mg Nebulization QHS Nicholaus Bloom, MD   0.25 mg at 06/29/14 1005  . FLUoxetine (PROZAC) capsule 40 mg  40 mg Oral  Daily Waylan Boga, NP   40 mg at 07/01/14 0835  . Ipratropium-Albuterol (COMBIVENT) respimat 1 puff  1 puff Inhalation BID Waylan Boga, NP   1 puff at 07/01/14 [phone]  . magnesium hydroxide (MILK OF MAGNESIA) suspension 30 mL  30 mL Oral Daily PRN Waylan Boga, NP      . multivitamin with minerals tablet 1 tablet  1 tablet Oral Daily Nicholaus Bloom, MD   1 tablet at 07/01/14 [phone]  . nicotine (NICODERM CQ - dosed in mg/24 hours) patch 14 mg  14 mg Transdermal Daily Waylan Boga, NP   14 mg at 07/01/14 0834  . oxyCODONE  (Oxy IR/ROXICODONE) immediate release tablet 15 mg  15 mg Oral Q6H PRN Nicholaus Bloom, MD   15 mg at 07/01/14 1117  . OxyCODONE (OXYCONTIN) 12 hr tablet 20 mg  20 mg Oral Q12H Nicholaus Bloom, MD   20 mg at 07/01/14 0836  . pregabalin (LYRICA) capsule 50 mg  50 mg Oral QID Nicholaus Bloom, MD   50 mg at 07/01/14 1620  . thiamine (VITAMIN B-1) tablet 100 mg  100 mg Oral Daily Nicholaus Bloom, MD   100 mg at 07/01/14 0835  . traZODone (DESYREL) tablet 200 mg  200 mg Oral QHS Waylan Boga, NP   200 mg at 06/30/14 2125  . tuberculin injection 5 Units  5 Units Intradermal Once Nena Polio, PA-C   5 Units at 06/29/14 1958    Lab Results: No results found for this or any previous visit (from the past 69 hour(s)).  Physical Findings: AIMS: Facial and Oral Movements Muscles of Facial Expression: None, normal Lips and Perioral Area: None, normal Jaw: None, normal Tongue: None, normal,Extremity Movements Upper (arms, wrists, hands, fingers): None, normal Lower (legs, knees, ankles, toes): None, normal, Trunk Movements Neck, shoulders, hips: None, normal, Overall Severity Severity of abnormal movements (highest score from questions above): None, normal Incapacitation due to abnormal movements: None, normal Patient's awareness of abnormal movements (rate only patient's report): No Awareness, Dental Status Current problems with teeth and/or dentures?: No Does patient usually wear dentures?: No  CIWA:  CIWA-Ar Total: 7 COWS:  COWS Total Score: 5  Treatment Plan Summary: Daily contact with patient to assess and evaluate symptoms and progress in treatment Medication management  Plan: Supportive approach/coping skills/relapse prevention           Alcohol Dependence: relapse prevention plan           Pain: will continue the opioids and verify an appointment with a pain clinic           Homelessness: will explore placement options Medical Decision Making Problem Points:  Established problem, worsening (2)  and Review of psycho-social stressors (1) Data Points:  Review of medication regiment & side effects (2)  I certify that inpatient services furnished can reasonably be expected to improve the patient's condition.   Talani Brazee A 07/01/2014, 4:29 PM

## 2014-07-01 NOTE — Progress Notes (Signed)
The focus of this group is to help patients review their daily goal of treatment and discuss progress on daily workbooks. Pt attended the evening group session and responded to all discussion prompts from the [place]. Pt shared that he may discharge home tomorrow and that he felt good about that. Pt stated that he now had the support of his family and that he felt this would help him be successful with his treatment this time. Pt had no additional requests from Nursing Staff this evening. Pt's affect was appropriate.

## 2014-07-01 NOTE — Tx Team (Signed)
Interdisciplinary Treatment Plan Update   Date Reviewed:  07/01/2014  Time Reviewed:  8:29 AM  Progress in Treatment:   Attending groups: Yes.. Participating in groups: Patient engages in group discussion Taking medication as prescribed: Yes  Tolerating medication: Yes Family/Significant other contact made: Yes,collateral contact with friend. Patient understands diagnosis: Yes, patient understands diagnosis and need for treatment. Discussing patient identified problems/goals with staff:.Yes, patient is able to express goals for treatment and discharge. Medical problems stabilized or resolved: Yes Denies suicidal/homicidal ideation:Yes Patient has not harmed self or others: Yes  For review of initial/current patient goals, please see plan of care.  Estimated Length of Stay: one day - discharge Wednesday  Reasons for Continued Hospitalization:  Anxiety Depression Medication stabilization   New Problems/Goals identified:    Discharge Plan or Barriers:   Home with outpatient follow up to be determined  Additional Comments:    Patient and CSW reviewed patient's identified goals and treatment plan.  Patient verbalized understanding and agreed to treatment plan.   Attendees:  Patient:  07/01/2014 8:29 AM   Signature:  Neita Garnet, MD 07/01/2014 8:29 AM  Signature: Carlton Adam, MD 07/01/2014 8:29 AM  Signature: Grayland Ormond, RN 07/01/2014 8:29 AM  Signature: Cleopatra Cedar,  RN 07/01/2014 8:29 AM  Signature:  07/01/2014 8:29 AM  Signature:  Joette Catching, LCSW 07/01/2014 8:29 AM  Signature:  Erasmo Downer Drinkard, LCSW-A 07/01/2014 8:29 AM  Signature:  07/01/2014 8:29 AM  Signature:           07/01/2014 8:29 AM  Signature: Lars Pinks, RN URCM 07/01/2014  8:29 AM  Signature:    07/01/2014  8:29 AM  Signature:  Cache Worker LCSW 07/01/2014  8:29 AM    Scribe for Treatment Team:   Joette Catching,  07/01/2014 8:29 AM

## 2014-07-01 NOTE — Clinical Social Work Note (Signed)
CSW spoke with patient brother, Avyn Coate.  Mr. Mazzoni advised for reasons he was unable to go into, he would not be able to take patient into his home.

## 2014-07-01 NOTE — Progress Notes (Signed)
Recreation Therapy Notes  Animal-Assisted Activity/Therapy (AAA/T) Program Checklist/Progress Notes Patient Eligibility Criteria Checklist & Daily Group note for Rec Tx Intervention  Date: 01.05.2015 Time: 2:45pm Location: [address]    AAA/T Program Assumption of Risk Form signed by Patient/ or Parent Legal Guardian yes  Patient is free of allergies or sever asthma yes  Patient reports no fear of animals yes  Patient reports no history of cruelty to animals yes  Patient understands his/her participation is voluntary yes  Patient washes hands before animal contact yes  Patient washes hands after animal contact yes  Behavioral Response: Engaged, Appropriate   Education: Contractor, Appropriate Animal Interaction   Education Outcome: Acknowledges education.   Clinical Observations/Feedback: Patient interacted appropriately with therapy dog and peers. Patient shared stories about his pets at home with group and asked appropriate questions about therapy dog and his training.   Laureen Ochs Aleen Marston, LRT/CTRS  Koriana Stepien L 07/01/2014 4:55 PM

## 2014-07-01 NOTE — Progress Notes (Signed)
Patient ID: Steven KISSEL Sr., male   DOB: 1956/06/09, 59 y.o.   MRN: [id number] D: Client in his room today "I was cold came in here to warm up" client reports his goal today was to stay focus on recovery"  "seek the things I need and not on the things I want"  Client feels that her goals have been met with groups "groups kept me on track, focused on positive things in my life" Client rates depression at "7" of 10. A: Writer provided emotional support, encouraged him to keep positive attitude towards recovery. Medications reviewed and administered per orders. Staff will monitor q58mn for safety. R: Client is safe on the unit, attended group.

## 2014-07-01 NOTE — Progress Notes (Signed)
Pt attended spiritual care group on grief and loss facilitated by chaplain Jerene Pitch and counseling intern Steven Marsena Taff. Group opened with brief discussion and psycho-social ed around grief and loss in relationships and in relation to self - identifying life patterns, circumstances, changes that cause losses. Established group norm of speaking from own life experience. Group goal of establishing open and affirming space for members to share loss and experience with grief, normalize grief experience and provide psycho social education and grief support. Group drew on narrative and Alderian therapeutic modalities.   Steven Wiley was present throughout group and contributed towards the end of the discussion. Steven Wiley was supportive towards another group member by affirming how supportive she had been to a loved one. Steven Wiley lost his son, job and subsequently house recently, but stated he is looking to find his meaning and purpose after these losses. Previously Steven Wiley coped with ETOH and stated these losses were building up now.   Steven Wiley Counseling Intern

## 2014-07-01 NOTE — BHH Group Notes (Signed)
The focus of this group is to educate the patient on the purpose and policies of crisis stabilization and provide a format to answer questions about their admission.  The group details unit policies and expectations of patients while admitted.  Patient did not attend 0900 nurse education orientation group this morning.  Patient stayed in bed.   

## 2014-07-01 NOTE — BHH Group Notes (Signed)
[facility] LCSW Group Therapy      Feelings About Diagnosis 1:15 - 2:30 PM         07/01/2014 2:37 PM    Type of Therapy:  Group Therapy  Participation Level:  Active  Participation Quality:  Appropriate  Affect:  Appropriate  Cognitive:  Alert and Appropriate  Insight:  Developing/Improving and Engaged  Engagement in Therapy:  Developing/Improving and Engaged  Modes of Intervention:  Discussion, Education, Exploration, Problem-Solving, Rapport Building, Support  Summary of Progress/Problems:  Patient actively participated in group. Patient discussed past and present diagnosis and the effects it has had on  life.  Patient talked about family and society being judgmental and the stigma associated with having a mental health diagnosis.  Patient shared he does not accept having a diagnosis of Bipolar but knows he has an illness tht needs to be addressed.  Concha Pyo 07/01/2014  2:37 PM\

## 2014-07-01 NOTE — Progress Notes (Signed)
Patient ID: RYDEN WAINER Sr., male   DOB: 05-Dec-1955, 59 y.o.   MRN: [id number] D: Pt denies SI/HI/AV. Pt is pleasant and cooperative. Pt rates depression at a 8, anxiety at a 7, and Helplessness/hopelessness at a 8.  A: Pt was offered support and encouragement. Pt was given scheduled medications. Pt was encourage to attend groups. Q 15 minute checks were done for safety.  R:Pt attends groups and interacts well with peers and staff. Pt taking medication. Pt has no complaints.Pt receptive to treatment and safety maintained on unit.

## 2014-07-02 DIAGNOSIS — F1020 Alcohol dependence, uncomplicated: Secondary | ICD-10-CM

## 2014-07-02 DIAGNOSIS — F10230 Alcohol dependence with withdrawal, uncomplicated: Secondary | ICD-10-CM | POA: Insufficient documentation

## 2014-07-02 MED ORDER — FORMOTEROL FUMARATE 20 MCG/2ML IN NEBU: 20.0000 ug | INHALATION_SOLUTION | Freq: Two times a day (BID) | RESPIRATORY_TRACT | Status: DC

## 2014-07-02 MED ORDER — TRAZODONE HCL 100 MG PO TABS: 200.0000 mg | ORAL_TABLET | Freq: Every day | ORAL | Status: DC

## 2014-07-02 MED ORDER — ALBUTEROL SULFATE (5 MG/ML) 0.5% IN NEBU: 2.5000 mg | INHALATION_SOLUTION | Freq: Four times a day (QID) | RESPIRATORY_TRACT | Status: DC | PRN

## 2014-07-02 MED ORDER — ACLIDINIUM BROMIDE 400 MCG/ACT IN AEPB: 400.0000 ug | INHALATION_SPRAY | Freq: Two times a day (BID) | RESPIRATORY_TRACT | Status: DC

## 2014-07-02 MED ORDER — OXYCODONE HCL 15 MG PO TABS: 15.0000 mg | ORAL_TABLET | Freq: Four times a day (QID) | ORAL | Status: DC | PRN

## 2014-07-02 MED ORDER — IPRATROPIUM-ALBUTEROL 18-103 MCG/ACT IN AERO: 1.0000 | INHALATION_SPRAY | Freq: Two times a day (BID) | RESPIRATORY_TRACT | Status: DC

## 2014-07-02 MED ORDER — OXYCODONE HCL ER 20 MG PO T12A: 20.0000 mg | EXTENDED_RELEASE_TABLET | Freq: Two times a day (BID) | ORAL | Status: DC

## 2014-07-02 MED ORDER — FLUOXETINE HCL 40 MG PO CAPS: 40.0000 mg | ORAL_CAPSULE | Freq: Every day | ORAL | Status: DC

## 2014-07-02 MED ORDER — BUDESONIDE 0.25 MG/2ML IN SUSP: 0.2500 mg | Freq: Four times a day (QID) | RESPIRATORY_TRACT | Status: DC

## 2014-07-02 MED ORDER — MULTIVITAMINS PO CAPS: 1.0000 | ORAL_CAPSULE | Freq: Every day | ORAL | Status: DC

## 2014-07-02 MED ORDER — PREGABALIN 25 MG PO CAPS: 25.0000 mg | ORAL_CAPSULE | Freq: Four times a day (QID) | ORAL | Status: DC

## 2014-07-02 NOTE — Progress Notes (Signed)
Patient ID: Steven YANDOW Sr., male   DOB: 04-24-1956, 59 y.o.   MRN: [id number]  Pt discharged home via a friend. Pt was stable and appreciative for "All this place has done for me." All papers and prescriptions were given and valuables returned. Verbal understanding expressed. Denies SI/HI and A/VH. Pt given opportunity to express concerns and ask questions. Pt able to verbalize coping skills to utilize outside of the facility. Pt states intent to follow up with [facility] tomorrow.

## 2014-07-02 NOTE — BHH Group Notes (Signed)
[facility] LCSW Aftercare Discharge Planning Group Note   07/02/2014 10:44 AM    Participation Quality:  Appropraite  Mood/Affect:  Appropriate  Depression Rating:  6  Anxiety Rating:  6  Thoughts of Suicide:  No  Will you contract for safety?   NA  Current AVH:  No  Plan for Discharge/Comments:  Patient attended discharge planning group and actively participated in group. Patient advised of being safe to discharge home today.  He will follow up with [facility].  Suicide prevention education reviewed and SPE document provided.   Transportation Means: Patient has transportation.   Supports:  Patient has a support system.   Taegen Delker, Eulas Post
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
## 2014-07-02 NOTE — Progress Notes (Addendum)
[facility] Adult Case Management Discharge Plan :  Will you be returning to the same living situation after discharge: No.  Patient exploring options  At discharge, do you have transportation home?:Yes,  Patient arranged transportation home. Do you have the ability to pay for your medications: No.  Patient has Medicaid but no income.  Release of information consent forms completed and in the chart;  Patient's signature needed at discharge.  Patient to Follow up at: Follow-up Information    Follow up with [facility].   Why:  Please go to [facility]'s walk in clinic on Thursday, July 03, 2014 or any weekday between [place] for medication management/counseling   Contact information:   [address] [place], [place]   [address]  [phone]      Patient denies SI/HI:  Patient no longer endorsing SI/HI or other thoughts of self harm.  Safety Planning and Suicide Prevention discussed: .Reviewed with all patients during discharge planning group  N/A patient is not a smoker.. Patient is smoker and is being referred to the quit line.  Concha Pyo 07/02/2014, 9:56 AM

## 2014-07-02 NOTE — Progress Notes (Addendum)
Patient ID: Steven SHELLS Sr., male   DOB: 1956/02/24, 59 y.o.   MRN: [id number]   Pt currently presents with an appropriate affect and anxious behavior. Per self inventory, pt rates depression at a 7, hopelessness 7 and anxiety 7. Pt's daily goal is to "working on my recovery" and they intend to do so by "stay focused on what I need, instead of what I want." Pt reports good sleep, fair concentration and a decreased energy.  Pt provided with medications per providers orders. Pt's labs and vitals were monitored throughout the day. Pt supported emotionally and encouraged to express concerns and questions. Pt consulted with Catering manager. Pt educated on medications. Pt's safety ensured with 15 minute and environmental checks. Pt currently denies SI/HI and A/V hallucinations. Pt verbally agrees to seek staff if SI/HI or A/VH occurs and to consult with staff before acting on these thoughts. Pt to be discharged today. PPD read 07/02/2014. No induration noted. 2mm area of erythema noted at injection site. MD and provider notified.

## 2014-07-03 NOTE — Progress Notes (Signed)
Patient Discharge Instructions:  After Visit Summary (AVS):   Faxed to:  07/03/14 Discharge Summary Note:   Faxed to:  07/03/14 Psychiatric Admission Assessment Note:   Faxed to:  07/03/14 Suicide Risk Assessment - Discharge Assessment:   Faxed to:  07/03/14 Faxed/Sent to the Next Level Care provider:  07/03/14 Faxed to [facility] @ [place], 07/03/2014, 3:50 PM

## 2014-07-10 ENCOUNTER — Inpatient Hospital Stay: Payer: Self-pay | Admitting: Internal Medicine

## 2014-07-10 LAB — COMPREHENSIVE METABOLIC PANEL
ALBUMIN: 3 g/dL — AB (ref 3.4–5.0)
ALK PHOS: 114 U/L
Anion Gap: 8 (ref 7–16)
BILIRUBIN TOTAL: 0.5 mg/dL (ref 0.2–1.0)
BUN: 16 mg/dL (ref 7–18)
CREATININE: 1.3 mg/dL (ref 0.60–1.30)
Calcium, Total: 8.1 mg/dL — ABNORMAL LOW (ref 8.5–10.1)
Chloride: 95 mmol/L — ABNORMAL LOW (ref 98–107)
Co2: 37 mmol/L — ABNORMAL HIGH (ref 21–32)
EGFR (African American): >60
EGFR (Non-African Amer.): >60
Glucose: 125 mg/dL — ABNORMAL HIGH (ref 65–99)
Osmolality: 282 (ref 275–301)
Potassium: 2.8 mmol/L — ABNORMAL LOW (ref 3.5–5.1)
SGOT(AST): 43 U/L — ABNORMAL HIGH (ref 15–37)
SGPT (ALT): 66 U/L — ABNORMAL HIGH
Sodium: 140 mmol/L (ref 136–145)
Total Protein: 6.9 g/dL (ref 6.4–8.2)

## 2014-07-10 LAB — CBC
HCT: 40.9 % (ref 40.0–52.0)
HGB: 13.3 g/dL (ref 13.0–18.0)
MCH: 29.8 pg (ref 26.0–34.0)
MCHC: 32.6 g/dL (ref 32.0–36.0)
MCV: 92 fL (ref 80–100)
Platelet: 309 10*3/uL (ref 150–440)
RBC: 4.47 10*6/uL (ref 4.40–5.90)
RDW: 15.6 % — AB (ref 11.5–14.5)
WBC: 10.2 10*3/uL (ref 3.8–10.6)

## 2014-07-10 LAB — PRO B NATRIURETIC PEPTIDE: B-TYPE NATIURETIC PEPTID: 1623 pg/mL — AB (ref 0–125)

## 2014-07-10 LAB — ETHANOL: Ethanol: 17 mg/dL

## 2014-07-10 LAB — TROPONIN I

## 2014-07-10 LAB — CK TOTAL AND CKMB (NOT AT ARMC)
CK, TOTAL: 391 U/L — AB (ref 39–308)
CK-MB: 2.7 ng/mL (ref 0.5–3.6)

## 2014-07-11 LAB — COMPREHENSIVE METABOLIC PANEL
ALT: 51 U/L
AST: 20 U/L (ref 15–37)
Albumin: 2.7 g/dL — ABNORMAL LOW (ref 3.4–5.0)
Alkaline Phosphatase: 98 U/L
Anion Gap: 6 — ABNORMAL LOW (ref 7–16)
BUN: 19 mg/dL — AB (ref 7–18)
Bilirubin,Total: 0.3 mg/dL (ref 0.2–1.0)
CALCIUM: 8.3 mg/dL — AB (ref 8.5–10.1)
Chloride: 95 mmol/L — ABNORMAL LOW (ref 98–107)
Co2: 36 mmol/L — ABNORMAL HIGH (ref 21–32)
Creatinine: 1.27 mg/dL (ref 0.60–1.30)
EGFR (African American): >60
EGFR (Non-African Amer.): >60
Glucose: 164 mg/dL — ABNORMAL HIGH (ref 65–99)
Osmolality: 280 (ref 275–301)
Potassium: 4.7 mmol/L (ref 3.5–5.1)
Sodium: 137 mmol/L (ref 136–145)
TOTAL PROTEIN: 6.3 g/dL — AB (ref 6.4–8.2)

## 2014-07-14 LAB — CBC WITH DIFFERENTIAL/PLATELET
Basophil #: 0 10*3/uL (ref 0.0–0.1)
Basophil %: 0.1 %
Eosinophil #: 0 10*3/uL (ref 0.0–0.7)
Eosinophil %: 0.1 %
HCT: 42 % (ref 40.0–52.0)
HGB: 13.2 g/dL (ref 13.0–18.0)
Lymphocyte #: 0.5 10*3/uL — ABNORMAL LOW (ref 1.0–3.6)
Lymphocyte %: 4.4 %
MCH: 29.1 pg (ref 26.0–34.0)
MCHC: 31.5 g/dL — ABNORMAL LOW (ref 32.0–36.0)
MCV: 93 fL (ref 80–100)
MONOS PCT: 4.5 %
Monocyte #: 0.5 x10 3/mm (ref 0.2–1.0)
NEUTROS PCT: 90.9 %
Neutrophil #: 10.8 10*3/uL — ABNORMAL HIGH (ref 1.4–6.5)
PLATELETS: 283 10*3/uL (ref 150–440)
RBC: 4.54 10*6/uL (ref 4.40–5.90)
RDW: 15.9 % — ABNORMAL HIGH (ref 11.5–14.5)
WBC: 11.9 10*3/uL — ABNORMAL HIGH (ref 3.8–10.6)

## 2014-07-14 LAB — EXPECTORATED SPUTUM ASSESSMENT W GRAM STAIN, RFLX TO RESP C

## 2014-07-14 LAB — BASIC METABOLIC PANEL
Anion Gap: 6 — ABNORMAL LOW (ref 7–16)
BUN: 26 mg/dL — ABNORMAL HIGH (ref 7–18)
CALCIUM: 8.2 mg/dL — AB (ref 8.5–10.1)
CHLORIDE: 104 mmol/L (ref 98–107)
CO2: 29 mmol/L (ref 21–32)
Creatinine: 1.04 mg/dL (ref 0.60–1.30)
Glucose: 166 mg/dL — ABNORMAL HIGH (ref 65–99)
Osmolality: 286 (ref 275–301)
Potassium: 4.7 mmol/L (ref 3.5–5.1)
Sodium: 139 mmol/L (ref 136–145)

## 2014-07-15 LAB — BASIC METABOLIC PANEL
ANION GAP: 7 (ref 7–16)
BUN: 27 mg/dL — ABNORMAL HIGH (ref 7–18)
CREATININE: 1.21 mg/dL (ref 0.60–1.30)
Calcium, Total: 8.2 mg/dL — ABNORMAL LOW (ref 8.5–10.1)
Chloride: 102 mmol/L (ref 98–107)
Co2: 30 mmol/L (ref 21–32)
EGFR (African American): >60
GLUCOSE: 194 mg/dL — AB (ref 65–99)
Osmolality: 288 (ref 275–301)
Potassium: 4.5 mmol/L (ref 3.5–5.1)
SODIUM: 139 mmol/L (ref 136–145)

## 2014-07-15 LAB — MAGNESIUM: MAGNESIUM: 2 mg/dL

## 2014-07-16 LAB — BASIC METABOLIC PANEL
Anion Gap: 5 — ABNORMAL LOW (ref 7–16)
BUN: 25 mg/dL — ABNORMAL HIGH (ref 7–18)
Calcium, Total: 8.1 mg/dL — ABNORMAL LOW (ref 8.5–10.1)
Chloride: 105 mmol/L (ref 98–107)
Co2: 29 mmol/L (ref 21–32)
Creatinine: 0.92 mg/dL (ref 0.60–1.30)
EGFR (African American): >60
EGFR (Non-African Amer.): >60
GLUCOSE: 169 mg/dL — AB (ref 65–99)
Osmolality: 286 (ref 275–301)
Potassium: 4.7 mmol/L (ref 3.5–5.1)
SODIUM: 139 mmol/L (ref 136–145)

## 2014-07-17 LAB — BASIC METABOLIC PANEL
Anion Gap: 4 — ABNORMAL LOW (ref 7–16)
BUN: 31 mg/dL — AB (ref 7–18)
CREATININE: 0.94 mg/dL (ref 0.60–1.30)
Calcium, Total: 8.4 mg/dL — ABNORMAL LOW (ref 8.5–10.1)
Chloride: 101 mmol/L (ref 98–107)
Co2: 32 mmol/L (ref 21–32)
EGFR (African American): >60
GLUCOSE: 168 mg/dL — AB (ref 65–99)
OSMOLALITY: 284 (ref 275–301)
POTASSIUM: 4.7 mmol/L (ref 3.5–5.1)
Sodium: 137 mmol/L (ref 136–145)

## 2014-08-27 ENCOUNTER — Ambulatory Visit
Admit: 2014-08-27 | Disposition: A | Discharge status: Home / Self Care | Payer: Self-pay | Attending: Internal Medicine | Admitting: Internal Medicine

## 2014-09-03 ENCOUNTER — Inpatient Hospital Stay: Payer: Self-pay | Admitting: Internal Medicine

## 2014-09-18 ENCOUNTER — Inpatient Hospital Stay: Payer: Self-pay | Admitting: Internal Medicine

## 2014-09-18 LAB — BASIC METABOLIC PANEL
ANION GAP: 10 (ref 7–16)
BUN: 10 mg/dL
CALCIUM: 8.7 mg/dL — AB
Chloride: 98 mmol/L — ABNORMAL LOW
Co2: 30 mmol/L
Creatinine: 0.77 mg/dL
EGFR (Non-African Amer.): >60
Glucose: 93 mg/dL
POTASSIUM: 4.1 mmol/L
SODIUM: 138 mmol/L

## 2014-09-18 LAB — CBC
HCT: 36.5 % — AB (ref 40.0–52.0)
HGB: 11.5 g/dL — ABNORMAL LOW (ref 13.0–18.0)
MCH: 27.9 pg (ref 26.0–34.0)
MCHC: 31.4 g/dL — ABNORMAL LOW (ref 32.0–36.0)
MCV: 89 fL (ref 80–100)
Platelet: 165 10*3/uL (ref 150–440)
RBC: 4.12 10*6/uL — AB (ref 4.40–5.90)
RDW: 16.2 % — ABNORMAL HIGH (ref 11.5–14.5)
WBC: 11 10*3/uL — AB (ref 3.8–10.6)

## 2014-09-18 LAB — THEOPHYLLINE LEVEL: Theophylline: <2 ug/mL

## 2014-09-18 LAB — TROPONIN I: Troponin-I: <0.03 ng/mL

## 2014-09-26 ENCOUNTER — Ambulatory Visit
Admit: 2014-09-26 | Disposition: A | Discharge status: Home / Self Care | Payer: Self-pay | Attending: Internal Medicine | Admitting: Internal Medicine

## 2014-09-29 ENCOUNTER — Emergency Department
Admit: 2014-09-29 | Disposition: A | Discharge status: Home / Self Care | Payer: Self-pay | Admitting: Emergency Medicine

## 2014-10-18 NOTE — Discharge Summary (Signed)
PATIENT NAME:  Steven Wiley, Steven Wiley MR#:  [id number] DATE OF BIRTH:  August 25, 1955  DATE OF ADMISSION:  12/06/2013 DATE OF DISCHARGE:  12/13/2013  The patient states that the Advair and Spiriva do not work for him. We will get rid of these medications and do Tudorza Pressair one puff 1 to 4 times a day, 400 mcg/inhalation, as he takes at home. That is the only change in medication at this point from previous dictation.  ____________________________ Tana Conch. Leslye Peer, MD [id number] D: 12/13/2013 08:46:34 ET T: 12/13/2013 09:03:31 ET JOB#: [id number]  cc: Tana Conch. Leslye Peer, MD, <Dictator> Marisue Brooklyn MD ELECTRONICALLY SIGNED 12/13/2013 16:18
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
## 2014-10-18 NOTE — H&P (Signed)
PATIENT NAME:  Steven Wiley, Steven Wiley MR#:  [id number] DATE OF BIRTH:  April 14, 1956  DATE OF ADMISSION:  01/27/2014  PRIMARY CARE PHYSICIAN: Dr. Brynda Greathouse.   PULMONOLOGIST: Dr. Raul Del.   CHIEF COMPLAINT: Lower extremity weakness and pain and fall and shortness of breath.   HISTORY OF PRESENT ILLNESS: This is a 59 year old male who presents to the Emergency Room due to having some lower extremity weakness and a fall earlier. The patient says that his legs were a bit swollen and they were hurting. His legs gave way and he fell to the left side. He has been having significant pain in the left side of his chest along with some shortness of breath. The patient did have x-rays as he had recently fallen which showed no evidence of acute rib fractures but chronic fractures. He underwent a CT of his chest, which showed multilobar pneumonia. Hospitalist services were contacted for further treatment and evaluation. The patient admits to a cough productive with green-yellow sputum, positive chills, but no documented fever. Positive nausea but no vomiting. No other associated symptoms presently.   REVIEW OF SYSTEMS:   CONSTITUTIONAL: No documented fever. No weight gain. No weight loss.  EYES: No blurry or double vision.  ENT: No tinnitus. No postnasal drip. No redness of the oropharynx.  RESPIRATORY: Positive cough. Positive wheeze. No hemoptysis. Positive dyspnea. Positive COPD.  CARDIOVASCULAR: Positive left-sided chest pain. No orthopnea. No palpitations or syncope.  GASTROINTESTINAL: Positive nausea. No vomiting. No diarrhea. No abdominal pain. No melena or hematochezia.  GENITOURINARY: No dysuria and hematuria.  ENDOCRINE: No polyuria or nocturia. No heat or cold intolerance.  HEMATOLOGIC: No anemia, no bruising, no bleeding.  INTEGUMENTARY: No rashes. No lesions.  MUSCULOSKELETAL: No arthritis. No swelling. No gout.  NEUROLOGIC: No numbness, no tingling. No ataxia. No seizure-type activity.  PSYCHIATRIC: Positive  anxiety, depression. No ADD.   PAST MEDICAL HISTORY: Consistent with COPD, history of DVT, anxiety/depression, neuropathy, GERD.   ALLERGIES: PENICILLIN, WHICH CAUSES HIVES, TYLENOL, WHICH CAUSES GI UPSET.   SOCIAL HISTORY: Does have a 30 to 40-pack-year smoking history, quit about 2 months ago. No alcohol abuse. No illicit drug abuse. Lives by himself.   FAMILY HISTORY: Mother and father are both deceased. Father died from colon cancer. Mother died from old age.   CURRENT MEDICATIONS: Are as follows: DuoNeb b.i.d. as needed, Xanax 0.5 mg q. 6 hours as needed, fluoxetine 60 mg daily, gabapentin 800 mg t.i.d., melatonin at bedtime, multivitamin daily, oxycodone 50 mg 4 times daily as needed, Protonix 40 mg daily, trazodone 100 mg at bedtime, Tudorza 400 mcg 1 puff 2 to 4 times daily, Xarelto 20 mg daily.   PHYSICAL EXAMINATION: Presently is as follows:  VITALS SIGNS: Are noted to be: Temperature 102.2, pulse 80, respirations 31, blood pressure 120/80, sats 95% on room air.  GENERAL: He is a lethargic-appearing male in mild-to-moderate respiratory distress.  HEAD, EYES, EARS, NOSE, THROAT: He is atraumatic, normocephalic. Extraocular muscles are intact. Pupils equal and reactive to light. Sclerae anicteric. No conjunctival injection. No oropharyngeal erythema.  NECK: Supple. There is no jugular venous distention. No bruits, no lymphadenopathy, no thyromegaly.  HEART: Regular rate and rhythm. No murmurs, no rubs, no clicks.  LUNGS: He has coarse wheezing and rhonchi diffusely. Negative use of accessory muscles. No dullness to percussion.  ABDOMEN: Soft, flat, nontender, nondistended. Has good bowel sounds. No hepatosplenomegaly appreciated.  EXTREMITIES: No evidence of any cyanosis, clubbing. Does have +1 pitting edema from the knees to the ankles bilaterally, +  2 pedal and radial pulses bilaterally.  NEUROLOGICAL: The patient is alert, awake, and oriented x 3. No focal motor or sensory deficits  appreciated bilaterally.  SKIN: Moist and warm with no rashes appreciated.  LYMPHATIC: There is no cervical or axillary adenopathy.   LABORATORY AND RADIOLOGICAL DATA: Showed a serum glucose of 117, BUN 14, creatinine 1.2, sodium 137, potassium 4.1, chloride 99, bicarbonate 31, troponin less than 0.02. White cell count 11.6, hemoglobin 12.6, hematocrit 37.5, platelet count of 146,000. Urinalysis within normal limits.   The patient did have a chest x-ray done which showed stable chronic interstitial bronchitic changes, COPD. X-rays of the ribs on the left side showing multiple chronic left rib fractures but no acute evidence of a fracture. The patient also had a CT chest done with contrast showing no evidence of pulmonary embolism, but scattered areas of pneumonia bilaterally mainly in the right upper lobe. There are emphysema and centrilobular embolus components. There is bilateral lower lobe bronchiectasis.   ASSESSMENT AND PLAN: This is a 59 year old male with a history of chronic pain, chronic obstructive pulmonary disease, history of deep vein thrombosis, anxiety/depression, neuropathy, gastroesophageal reflux disease who presents to the hospital due to lower extremity weakness and pain and after a fall also noted to be short of breath and noted to be in chronic obstructive pulmonary disease exacerbation due to pneumonia.  1. Acute on chronic respiratory failure. This is likely due to his chronic obstructive pulmonary disease exacerbation. I will treat the patient with IV steroids, around-the-clock nebulizer treatments. Continue his Caprice Renshaw, start him on IV antibiotics for pneumonia with Levaquin and aztreonam. Follow sputum and blood cultures and follow him clinically.  2. Chronic obstructive pulmonary disease exacerbation. This is likely secondary to the pneumonia as seen on the CT chest. I will continue IV steroids, around-the-clock nebulizer treatments. Continue his Caprice Renshaw. We will treat him with  IV Levaquin and aztreonam for his pneumonia. Follow sputum and blood cultures. We will get a pulmonary consult with Dr. Raul Del.  3. Sepsis. The patient presented with fever, tachycardia, and CT chest findings consistent with pneumonia. I will treat him with IV antibiotics as mentioned with Levaquin and aztreonam. Follow blood and sputum cultures. Follow hemodynamics and fever curve.  4. Lower extremity weakness and fall. We will get a physical therapy consult to assess his mobility.  5. A history of deep vein thrombosis. Continue with Xarelto.  6. A history of anxiety, depression. Continue Xanax and Prozac.  7. Gastroesophageal reflux disease. Continue Protonix. 8. Chronic pain. Continue with his p.r.n. oxycodone.   CODE STATUS: The patient is a full code.   TIME SPENT: 50 minutes.     ____________________________ Belia Heman. Verdell Carmine, MD vjs:lt D: 01/27/2014 20:28:40 ET T: 01/27/2014 21:56:45 ET JOB#: [id number]  cc: Belia Heman. Verdell Carmine, MD, <Dictator> Henreitta Leber MD ELECTRONICALLY SIGNED 02/07/2014 14:32

## 2014-10-18 NOTE — H&P (Signed)
PATIENT NAME:  Steven Wiley, Steven Wiley MR#:  [id number] DATE OF BIRTH:  1956-02-19  DATE OF ADMISSION:  05/20/2014  PRIMARY CARE PHYSICIAN: Jaquelyn Bitter B. Brynda Greathouse, MD  REFERRING EMERGENCY ROOM PHYSICIAN: Latina Craver, MD  CHIEF COMPLAINT: Shortness of breath and tachycardia.   HISTORY OF PRESENTING ILLNESS: a 59 year old male who has history of DVT in the past. Has COPD, anxiety, neuropathy, and gastroesophageal reflux disease. States that he was on Xarelto for DVT and stopped taking it a month ago. Today, he started feeling severe shortness of breath and was feeling like almost choking, so he decided to come to Emergency Room. He does not know about palpitation. In ER, he was noted to be having his heart rate up to 180 with blood pressure stable. He was given IV fluid. EKG showed sinus tachycardia. After giving IV fluid, his heart rate slowed down. He was also given respiratory therapy and oxygen started.  He said that he uses oxygen on and off at home. Has some cough for the last few days, but denies any fever or chest pain with the episode. Given to hospitalist team after checking his D-dimer and CT angiogram was negative for pulmonary embolism.   REVIEW OF SYSTEMS:  CONSTITUTIONAL:  Negative for fever, fatigue, weakness, pain, or weight loss.  EYES: No blurring, double vision, discharge, or redness.  EARS, NOSE, THROAT: No tinnitus, ear pain, no hearing loss.  RESPIRATORY: The patient has some cough and shortness of breath. No wheezing. CARDIOVASCULAR: No chest pain, orthopnea, edema, arrhythmia, or palpitations.  GASTROINTESTINAL: No nausea, vomiting, diarrhea, abdominal pain. GENITOURINARY:  No dysuria, hematuria, or increased frequency.  ENDOCRINE: No heat or cold intolerance. No excessive sweating.  SKIN: No acne, rashes or lesions MUSCULOSKELETAL: No pain or swelling in the joints.  NEUROLOGICAL: No numbness, weakness, tremor, or vertigo.  PSYCHIATRIC: No anxiety, insomnia, bipolar disorder.    PAST MEDICAL HISTORY: 1.  COPD. 2.  History of DVT. Finished oral Xarelto therapy.  3.  Gastroesophageal reflux disease. 4.  Anxiety.   SOCIAL HISTORY: He was a smoker 30-40 pack smoking history. Quit one year ago.  No illicit drugs. Lives by himself.   FAMILY HISTORY: Mother and father both died. Father died from colon cancer, mother from old age.   HOME MEDICATIONS:  1.  Trazodone 100 mg oral once a day at bedtime.  2.  Pantoprazole 40 mg once a day.  3.  Oxycodone 40 mg delayed-release every 12 hours. 5.  Oxycodone 15 mg 4 times a day.  6.  Multivitamin tablet once a day.  7.  Melatonin once a day.  8.  Gabapentin 400 mg 2 capsules 3 times a day.  9.  Fluoxetine 20 mg 3 capsules once a day.  10.  Budesonide 2 mL inhalations 2 times a day.  11.  Alprazolam 0.5 mg every 6 hours.  12.  Albuterol and ipratropium inhalation solution 3 mL 2 times a day.  PHYSICAL EXAMINATION: VITALS: In ER, temperature 97.8, pulse was 183 on presentation; it came down to 81. Blood pressure 124/61, and pulse oximetry 94 on room air.  GENERAL: The patient is fully alert and oriented to time, place, and person, slightly anxious.  HEENT: Head and neck atraumatic. Conjunctivae pink. Oral mucosa moist.  NECK: Supple. No JVD. No thyroid tenderness.  RESPIRATORY: Bilateral equal and clear air entry, mild wheezing present, but no crepitation.  CARDIOVASCULAR: S1, S2 present, regular, tachycardia. No local tenderness on the chest palpation.  ABDOMEN: Soft, nontender. Bowel sounds  present. No organomegaly.  SKIN: No acne, rashes, or lesions.  EXTREMITIES: Legs: No edema. Joints: No swelling or tenderness.  SKIN: No acne, rashes, or lesions.  NEUROLOGICAL: Power 5/5 in right upper limb, right lower limb, left upper limb, and left lower extremity. No tremor or rigidity. Follows commands. No numbness.  PSYCHIATRIC: Does not appear in any acute psychiatric illness at this time.  IMAGING: CT angiography of the  chest is done which showed negative for acute pulmonary embolism. No acute cardiopulmonary disease, secretions noted, layering with right mainstem bronchus.   LABORATORY DATA: D-dimer is 1640. Glucose 123. BNP is 45. BUN 11, creatinine 0.92, sodium 142, potassium 3.6, chloride 105, CO2 of 29, calcium 8.4. Troponin less than 0.02. WBC 6.6, hemoglobin 15.3. Platelet count 187,000 and MCV is 94. INR is 1.   ASSESSMENT AND PLAN: A 59 year old male with a past medical history of chronic obstructive pulmonary disease, anxiety, and deep vein thrombosis and gastroesophageal reflux disease. Came with shortness of breath, cough, and tachycardia.  1.  Severe tachycardia with shortness of breath. I think this would be triggered by his shortness of breath, which might be due to his bronchitis, which was resolved by intravenous fluid. Maybe anxiety was also playing a role over there. Currently, his heart rate is under control as well as blood pressure without any medication and I reviewed the EKG. It showed sinus tachycardia, so no further workup needed at this time. We will just to do troponin followup and follow him on telemetry for any evidence of arrhythmia.  2.  Shortness of breath with cough, acute bronchitis. The patient had this problem for the last 2-3 days. Will just keep him on nebulizer therapy and continue oxygen. To me, he does not require any antibiotic at this time, but if he continues to get worse or does not get better, we might have to start him on antibiotic. 3.  Chronic obstructive pulmonary disease. Currently, there is some mild wheezing, so we will give him nebulizer therapy and continue his oxygen.  4.  History of deep vein thrombosis. He said that he finished his therapy. Currently, there is no evidence of pulmonary embolus so does not require any further treatment. 5.  Gastroesophageal reflux disease. Continue Protonix.   TOTAL TIME SPENT ON THIS ADMISSION: 50 minutes.     ____________________________ Ceasar Lund Anselm Jungling, MD vgv:LT D: 05/20/2014 16:32:21 ET T: 05/20/2014 16:54:39 ET JOB#: [id number]  cc: Ceasar Lund. Anselm Jungling, MD, <Dictator> Mikeal Hawthorne. Brynda Greathouse, MD Vaughan Basta MD ELECTRONICALLY SIGNED 05/26/2014 9:02

## 2014-10-18 NOTE — Discharge Summary (Signed)
PATIENT NAME:  Steven Wiley, Steven Wiley MR#:  [id number] DATE OF BIRTH:  1956-06-17  DATE OF ADMISSION:  05/20/2014 DATE OF DISCHARGE:  05/27/2014  ADMISSION DIAGNOSES:  1.  Sinus tachycardia.  2.  Chronic obstructive pulmonary disease exacerbation.   DISCHARGE DIAGNOSES: 1.  Severe sinus tachycardia from chronic obstructive pulmonary disease exacerbation.  2.  Chronic obstructive pulmonary disease exacerbation of acute bronchitis.  3.  History of deep vein thrombosis.  4.  Oral thrush.   CONSULTATIONS: None.   LABORATORIES AT DISCHARGE: Most recent platelet count was 155,000.   PHYSICAL EXAMINATION: VITAL SIGNS: Temperature 97.9, pulse of 77, respirations 18, blood pressure 145/89, 92% on room air.  GENERAL: The patient is alert, oriented, not in acute distress.  CARDIOVASCULAR: Regular rate and rhythm. No murmurs, gallops, or rubs. PMI is not displaced. LUNGS: He has upper airway wheezing. No lower airway wheezing.  No rhonchi or rales are heard.  ABDOMEN: Obese. Bowel sounds are positive and nontender. Hard to appreciate organomegaly due to body habitus.  EXTREMITIES: No clubbing, cyanosis, or edema.   HOSPITAL COURSE: A 59 year old male with a history of COPD with chronic respiratory failure on 2 liters of oxygen, anxiety, neuropathy, who presents with shortness of breath and tachycardia, found to have tachycardia as well as COPD exacerbation.  For further details, please refer to H and P.  1.  Severe tachycardia. The patient was admitted initially with severe tachycardia secondary to his acute COPD exacerbation and anxiety. He was placed on telemetry. Cardiac markers were negative.  This has resolved.  2.  Acute chronic obstructive pulmonary disease exacerbation with acute bronchitis. The patient was placed on IV steroids, nebulizers, inhalers. She did refuse Spiriva while he was here. He does wear oxygen at home, but it is noted that at times, he was not wearing it here.  His saturations were  92% up to 94% on room air. On discharge, he has some wheezing but it is all upper airway. Speaking full sentences. Still has a minor cough. He reported some dark sputum, which we did not witness, but this is likely due to the fact that the patient was coughing and had some violent coughing spells.  3.  History of deep vein thrombosis.  He says that he finishes therapy. 4.  Oral thrush. The patient was on nystatin.   DISCHARGE MEDICATIONS: 1.  Fluoxetine 20 mg 3 tablets daily.  2.  Multivitamin 1 tablet daily. 3.  Trazodone 100 mg at bedtime. 4.  Oxycodone 15 mg 4 times a day p.r.n.  5.  Gabapentin 400 mg 2 tablets t.i.d.  6.  Pantoprazole 40 mg daily. 7.  Albuterol ipratropium 3 mL b.i.d.  8.  Melatonin 1 tablet daily.  9.  Oxycodone 40 mg q. 12 hours.  10.  Xanax 0.5 mg q. 6  hours p.r.n.  11. Budesonide 2 mL b.i.d.  12.  Prednisone taper starting at 60 mg, tapering by 10 milligrams every 2 days.  13.  Advair Diskus 250/50 b.i.d.  14.  Azithromycin 500 mg p.o. daily x3 days.  15.  Spiriva 18 mcg daily. 16.  Nystatin suspension 5 mL q. 8 hours for 4 days.  DISCHARGE DIET: Regular diet.   DISCHARGE ACTIVITY: As tolerated.   DISCHARGE FOLLOWUP:  The patient will need to follow up with his PCP within the next week.   TIME SPENT: Approximately 40 minutes.   DISPOSITION:  The patient was stable for discharge    ____________________________ Jazzmyn Filion P. Benjie Karvonen, MD spm:DT D:  05/27/2014 12:34:53 ET T: 05/27/2014 13:44:55 ET JOB#: [id number]  cc: Philis Doke P. Benjie Karvonen, MD, <Dictator> Donell Beers Kort Stettler MD ELECTRONICALLY SIGNED 05/27/2014 20:45

## 2014-10-18 NOTE — Discharge Summary (Signed)
PATIENT NAME:  Steven Wiley, Steven Wiley MR#:  [id number] DATE OF BIRTH:  March 10, 1956  DATE OF ADMISSION:  12/06/2013 DATE OF DISCHARGE:  12/13/2013  PRIMARY CARE PHYSICIAN: Nicky Pugh, MD  FINAL DIAGNOSES: 1.  Acute respiratory failure, which resolved.  2.  Chronic obstructive pulmonary disease exacerbation.  3.  Deep vein thrombosis right lower extremity.  4.  Pulmonary nodule.  5.  Tobacco abuse.  6.  Anxiety and depression.  7.  Chronic back pain.  8.  Thrush and fungal infection on the feet.   DISCHARGE MEDICATIONS: Include: 1. Fluoxetine 20 mg 3 capsules once a day. 2.  Multivitamin 1 tablet daily. 3.  Nicotine patch 14 mg chest wall daily. 4.  Trazodone 100 mg at bedtime. 5.  Oxycodone 15 mg 1 tab orally 4 times a day as needed for pain. 6.  Gabapentin 400 mg 2 capsules 3 times a day. 7.  DuoNeb nebulizer solution 3 mL 4 times a day. 8.  Prednisone taper 10 mg 4 tablets days one and two, 3 tablets days three and four, 2 tablets days five and six, 1 tablet days seven and eight, 1/2 tablet days nine and 10, then stop.  9.  Xarelto 15 mg 1 tablet twice a day for 15 more days and then dose needs to be changed to 20 mg p.o. daily for a total of 3 to 6 months of treatment. Recommend checking a sonogram of the lower extremity prior to stopping medication. 10.  Alprazolam 0.5 mg every 6 hours as needed for anxiety. 11.  Nystatin swish and swallow 5 mL orally every 6 hours, at least continue this while on the steroid taper. 12.  Advair Diskus 250/50 one inhalation twice a day. 13.  Spiriva 1 inhalation daily. 14.  Ammonium lactate topical solution 1 application twice a day to feet. 15.  Clotrimazole 1% topical cream 1 application twice a day to feet. 16.  Protonix 40 mg daily.   DISCHARGE DIET: Low sodium, regular consistency.   DISCHARGE ACTIVITY: As tolerated.   DISCHARGE FOLLOWUP: Follow up in 1 to 2 days with doctor at rehab.   REASON FOR ADMISSION: The patient was admitted December 06, 2013 and discharged December 03, 2013. Came in with lower extremity swelling, pain, some disorientation. Admitted with acute respiratory failure, pulse ox of 85% on room air. Also had some lower extremity swelling. Sonogram of the lower extremity was ordered, and the patient also had altered mental status.   LABORATORY AND RADIOLOGICAL DATA DURING THE HOSPITAL COURSE: EKG showed a normal sinus rhythm.   Chest x-ray showed lower lobe predominant interstitial thickening. Could be chronic bronchitis. Biapical pleural lobe opacities, likely scarring.   Lipase 158. Liver function tests normal range. Ethanol level negative. CPK 893. BNP 102. Glucose 89, BUN 19, creatinine 0.99, sodium 141, potassium 4, chloride 105, CO2 32, calcium 8.5. Troponin negative. White blood cell count 6.2, H and H 13.6 and 41.8, platelet count 187,000.   CT scan of the head showed no acute intracranial abnormalities.   ABG showed a pH of 7.34, pCO2 of 60, pO2 of 60, bicarbonate 32.4.   Ammonia level 46. Urinalysis negative.  Ultrasound of the lower extremities showed a right lower extremity DVT in the distal popliteal vein and branch of the right posterior tibial vein.   Next troponin negative. Next ammonia level 15.   EKG showed EF 45% to 50%, mildly decreased global left ventricular systolic function, hypokinesis in the distal anterior apical wall, left ventricular hypertrophy.  CT scan of the chest for PE showed no evidence of pulmonary embolism, prior granulomatous disease, small pulmonary nodules identified. Can repeat a CT scan in 3 to 6 months.  Creatinine upon discharge 0.99. Hemoglobin A1c 5.9.   DISCHARGE PHYSICAL EXAMINATION: Included: VITAL SIGNS: Blood pressure of 117/70, pulse ox 91% on room air, pulse 60, and temperature 98.2.  LUNGS: Good air entry. No use of accessory muscles to breathe. Slight expiratory wheeze at the bases, much improved from presentation.  CARDIOVASCULAR: S1, S2 normal. No gallops,  rubs, or murmurs heard. Carotid upstroke 2+ bilaterally. No bruits. Dorsalis pedis pulses 1+ bilaterally.  ABDOMEN: Soft. Slight tenderness in the epigastric area. No organosplenomegaly. Normoactive bowel sounds. No masses felt.  LYMPHATIC: No lymph nodes in the neck or extremities.  EXTREMITIES: No clubbing, edema or cyanosis.   HOSPITAL COURSE PER PROBLEM LIST:  1.  For the patient's acute respiratory failure, he was on nasal cannula during the entire hospitalization. Current pulse ox on room air is fine. He is off oxygen at this point.  2.  COPD exacerbation. The patient had persistent wheezing through the lungs the entire hospital stay. On high dose steroids most of the entire hospital stay. Upon discharge lungs are clear on inspiration and slight expiratory wheeze in the lower lung fields. Better air entry than on presentation and this was the reason why the patient was in the hospital for so long. Will give a steroid taper, Advair, Spiriva and DuoNeb nebulizer solution.  3.  DVT, right lower extremity. The patient was placed on Xarelto 15 mg twice a day. Will be on that for another 15 days then go to 20 mg daily. 4.  Pulmonary nodule. Since the patient has a history of smoking, recommend CT scan of the chest in 3 to 6 months to ensure stability.  5.  Tobacco abuse. The patient is already on nicotine patch.  6.  Anxiety and depression. The patient very concerned about his status. Had to start Xanax on him.  7.  Chronic back pain. On oxycodone. I increased the gabapentin also. Going to rehab secondary to weakness.  8.  Thrush and fungal infection on the feet. He is on nystatin swish and swallow. For his feet, he is on Lac-Hydrin and clotrimazole cream.  TIME SPENT ON DISCHARGE: 35 minutes.    ____________________________ Tana Conch. Leslye Peer, MD rjw:sb D: 12/13/2013 08:43:14 ET T: 12/13/2013 09:04:57 ET JOB#: [id number]  cc: Tana Conch. Leslye Peer, MD, <Dictator> Mikeal Hawthorne. Brynda Greathouse, MD Marisue Brooklyn MD ELECTRONICALLY SIGNED 12/13/2013 16:19

## 2014-10-18 NOTE — Consult Note (Signed)
PATIENT NAME:  Steven Wiley, Steven Wiley MR#:  [id number] DATE OF BIRTH:  June 01, 1956  DATE OF CONSULTATION:  05/30/2014  REFERRING PHYSICIAN:   CONSULTING PHYSICIAN:  Gonzella Lex, MD  IDENTIFYING INFORMATION AND CHIEF COMPLAINT: A 59 year old man presented to the Emergency Room.   CHIEF COMPLAINT: "I'm in so much pain."   HISTORY OF PRESENT ILLNESS: Information obtained from the patient and the chart. He was just discharged from the hospital 2 days ago. Although they had indicated that he was still taking narcotics, he was not given a prescription for any narcotics at the time of discharge. Apparently, it was assumed that he still had his outpatient medicines. In fact, he has overtaken his medicine to the point where he has run out of it and does not have a prescription due to be refilled for about another week. The patient therefore has not had any of his usual narcotics for the last 2 days. He is complaining of multiple symptoms of typical opiate withdrawal including pain all over his body, particularly abdominal pain, nausea, diarrhea, general malaise and jitteriness, feeling just sick all over. He tells me that his mood is bad related to this. He says that if the pain does not stop he does not know what he will do, but he did not actually say that he was going to kill or harm himself. Prior to this, he denied that he had been having any problems with depression or psychosis. He had been stable before running out of his medicine.   PAST PSYCHIATRIC HISTORY: The patient minimized it talking to me, but review of the old chart shows that he has a clear history of multiple drug abuse going back years. Has been treated for opiate and benzodiazepine abuse and toxicity in the past, has had overdoses of them in the past. Has had psychiatric hospitalizations for what sounds like psychotic episodes that may have been substance induced. He had been seeing Dr. Kasandra Knudsen, but says that he is not seeing him anymore. He is  unspecific about when he last did see him. He is still taking Prozac for depression. Denies any history of actually trying to kill himself in the past.   FAMILY HISTORY: Denies family history of mental illness.   SOCIAL HISTORY: The patient has been staying with his brother. The brother it sounds like he has probably put him out of the house at this point.   SUBSTANCE ABUSE HISTORY: As detailed above. He also says that he drinks alcohol occasionally, but has not been doing it recently. He denies that he has been abusing any other drugs recently.   PAST MEDICAL HISTORY: The patient has some pretty severe medical problems. He was just discharged from the hospital with a COPD exacerbation, also has history of DVTs, a history of some heart arrhythmias and tachycardia in the past.   MEDICATIONS: According to what he was taking when he was discharged last, it should be fluoxetine 60 mg a day, multivitamin once a day, trazodone 100 mg at night, oxycodone 15 mg 4 times a day, gabapentin 800 mg 3 times a day, pantoprazole 40 mg a day, ipratropium inhaler 3 mL twice a day, OxyContin 40 mg twice a day, Xanax 0.5 mg q. 6 hours, budesonide 2 mL inhaled twice a day, prednisone taper, Advair Diskus 250 mcg b.i.d., azithromycin 500 mg a day for 3 days, Spiriva HandiHaler 18 mcg a day, nystatin suspension to the mouth.   ALLERGIES: PENICILLIN AND TYLENOL AND VICODIN.   REVIEW  OF SYSTEMS: Aching abdomen, nausea, diarrhea, general malaise. Will not say that he is going to kill himself, but says over and over that if the pain does not stop he does not know what he will do. Denies homicidal ideation. Denies psychotic symptoms.   MENTAL STATUS EXAMINATION: A somewhat disheveled, chronically ill-looking man, interviewed in a hospital room. Eye contact minimal. Psychomotor activity marked by being curled into a fetal position, rolling around on the ground. Speech decreased in total amount. Affect seems pained and agitated  and irritated. Mood is stated as being bad. Thoughts are telegraphic. He answers questions as quickly and in as short a term as possible. No evidence of psychosis. Denies hallucinations. Will not be specific about suicidal ideation. No homicidal ideation. Alert and oriented x 4. Judgment and insight questionable. Can remember 3/3 objects immediately, 1/3 at 3 minutes.   ASSESSMENT: This is a 59 year old man who is essentially here in the hospital for opiate withdrawal as a result of his own misuse of his prescription narcotics. He has a history of narcotic and benzodiazepine abuse in the past. He does not have a history to support a major depression or a psychotic episode. He does not have a clear history of suicidality in the past. The patient does seem to be in a lot of physical distress right now.   ASSESSMENT: The patient does not require inpatient hospital level treatment. I have suggested that we refer him to [facility] and to [facility] for possible admission. Meanwhile, continue his outpatient medicines and I will give him Subutex 8 mg once and then p.r.n. for a day or so if needed. Hopefully we can get him to a substance abuse treatment facility or get him discharged. The case discussed with Emergency Room doctor.   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   SECONDARY DIAGNOSES: AXIS I: 1.  Opiate abuse.  2.  Benzodiazepine abuse    ____________________________ Gonzella Lex, MD jtc:at D: 05/30/2014 13:14:45 ET T: 05/30/2014 13:24:26 ET JOB#: [id number]  cc: Gonzella Lex, MD, <Dictator> Gonzella Lex MD ELECTRONICALLY SIGNED 06/08/2014 11:15

## 2014-10-18 NOTE — Discharge Summary (Signed)
PATIENT NAME:  Steven Wiley, Steven Wiley MR#:  [id number] DATE OF BIRTH:  [birth year]  DATE OF ADMISSION:  01/27/2014 DATE OF DISCHARGE:  02/04/2014  ADDENDUM:  Discharge Diagnosis:  1.  Acute on chronic respiratory failure, chronic obstructive pulmonary disease exacerbation.  2.  Clinical sepsis with pneumonia.  3.  Lower extremity weakness.  4.  Deep vein thrombosis, on Xarelto.  5.  Anxiety and depression.  6.  Gastroesophageal reflux disease.  7.  Chronic pain.  8.  Pulmonary nodule.   HOSPITAL COURSE: Please refer to interim discharge summary dictated by Dr. Loletha Grayer on August 9 for detailed hospital course from admission until discharge. The patient's respiratory failure and sepsis has been improving on current regimen. The patient did have some issues with pain, for which palliative care consultation was obtained on August 10 who started him on long-acting OxyContin 40 mg twice a day which seemed to help him. At this time, his pain seemed to be under fairly well control on current regimen.   He does have a bed available at short-term rehab at [facility] where he is being discharged.  VITAL SIGNS ON THE DATE OF DISCHARGE: As follows: Temperature 97.5, heart rate 72 per minute, respirations 17 per minute, blood pressure 145/98 mm hg, he is saturating 96% on room air.   Pertinent PHYSICAL EXAMINATION ON THE DAY OF DISCHARGE: CARDIOVASCULAR: S1, S2 normal. No murmurs, rubs, gallops. LUNGS: Clear to auscultation bilaterally. Does have minimal expi. wheezing but no rales, rhonchi, or crepitation.  ABDOMEN: Soft, benign. NEUROLOGICAL: Nonfocal examination.  All other physical examination remained at baseline.   DISCHARGE MEDICATIONS:  1.  Fluoxetine 20 mg 3 capsules p.o. daily.  2.  Multivitamin once daily.  3.  Trazodone 100 mg p.o. at bedtime.  4.  Oxycodone 15 mg p.o. 4 times a day as needed.  5.  Gabapentin 800 mg p.o. 3 times a day.  6.  Protonix 40 mg p.o. daily.  7.  DuoNeb  inhaled twice a day.  8.  Xarelto 20 mg p.o. daily. 9.  Tudorza 1 puff inhaled 2-4 times a day.  10.  Melatonin once at bedtime. 11.  Alprazolam 0.5 mg p.o. every 6 hours as needed.  12.  OxyContin 40 mg p.o. twice a day.  13.  Budesonide 1 dose inhaled twice a day.  14.  Levaquin 750 mg p.o. daily for 5 days.  15.  Prednisone 60 mg p.o. daily, taper 5 mg daily until finished.   DISCHARGE DIET: Low sodium, 1800 ADA.   DISCHARGE ACTIVITY: As tolerated.   DISCHARGE INSTRUCTIONS AND FOLLOWUP: The patient was instructed to follow up with his primary care physician, Dr. Nicky Pugh, in 1-2 weeks. He will need followup with Dr. Wallene Huh from pulmonary in 2-4 weeks. He will get physical therapy evaluation and management while at the facility. He remains at very high risk for readmission.   Please note his discharge diagnosis and secondary diagnosis remains same as dictated by Dr. Leslye Peer in the interim discharge summary. No new consultation was obtained, other than palliative care after dictating interim discharge summary.  Time discharging this patient was 45 minutes.   ____________________________ Lucina Mellow. Manuella Ghazi, MD vss:TT D: 02/04/2014 16:23:27 ET T: 02/04/2014 16:46:17 ET JOB#: [id number]  cc: Bradin Mcadory S. Manuella Ghazi, MD, <Dictator> Tana Conch. Leslye Peer, MD Mikeal Hawthorne Brynda Greathouse, MD Herbon E. Raul Del, [place] MD ELECTRONICALLY SIGNED 02/06/2014 18:53

## 2014-10-26 NOTE — H&P (Signed)
PATIENT NAME:  Steven Wiley, CLASS MR#:  [id number] DATE OF BIRTH:  1955-12-20  DATE OF ADMISSION:  07/10/2014  PRIMARY CARE PHYSICIAN:  Jaquelyn Bitter B. Brynda Greathouse, MD.  CHIEF COMPLAINT:  Cough and shortness of breath for 2 days.  Mr. Schreur is a 59 year old Caucasian gentleman with history of DVT in the past, COPD, anxiety, neuropathy, and acid reflux comes to the Emergency Room with complaints of increasing shortness of breath and productive cough for last 2 or 3 days.  He feels like he is choking and is wanting to throw up. He was found to be hypoxic with saturations in the 80s; 87 on room air on arrival.  He received IV Solu-Medrol, IV Levaquin. Chest x-ray is negative for pneumonia. He is being admitted for COPD exacerbation and possible acute bronchitis.   PAST MEDICAL HISTORY:  1. COPD.   2. Remote ex-smoker.  3. History of DVT, finished oral Xarelto therapy.  4. Gastroesophageal reflux disease.  5. Anxiety.  6. Chronic alcoholism.   The patient was admitted at [facility] in December and has quit alcohol since Christmas.   SOCIAL HISTORY:  He was a smoker, quit a year ago.  No illicit drug use. Denies any alcohol use since December 20, since Christmastime.  Lives by himself.   FAMILY HISTORY: Mother and father both died from colon cancer, mother from old age.   ALLERGIES: PENICILLIN, TYLENOL AND VICODIN.   MEDICATIONS:  1. Prazosin 1 mg p.o. daily at bedtime.  2. Oxycodone 10 mg 1 tablet p.o. 4 times a day.  3. Lyrica 50 mg 4 times a day.  4. Lorazepam 0.5 mg 1 tablet 4 times a day.  5. Gabapentin 300 mg 2 capsules 3 times a day.   REVIEW OF SYSTEMS:     CONSTITUTIONAL: No fever. Positive for fatigue, weakness.  EYES: No blurred or double vision, glaucoma or cataracts.  ENT: No tinnitus, ear pain, hearing loss.  RESPIRATORY: Positive for shortness of breath, cough, COPD.   CARDIOVASCULAR: Chest pain from coughing spells. No hypertension or arrhythmia.  GASTROINTESTINAL: No nausea, vomiting,  diarrhea, or abdominal pain. No GERD.  GENITOURINARY: No dysuria, hematuria, frequency.  ENDOCRINE: No polyuria, nocturia or thyroid problems.  HEMATOLOGY: No anemia or easy bruising.  SKIN: No acne, rash or lesion.  MUSCULOSKELETAL: Positive for arthritis and chronic back pain.  NEUROLOGIC: No CVA, TIA.  Positive for neuropathy.  PSYCHIATRIC: Positive for anxiety. No depression or bipolar disorder.   All other systems reviewed and negative.    LABORATORY DATA:  PH is 7.42 pCO2 is 46, BNP is 1623.  CBC within normal limits. Comprehensive metabolic panel:  Potassium is 2.8, chloride is 95, bicarbonate is 37, SGPT is 66, SGOT is 43, albumin is 3.0.    IMAGING:  Chest x-ray:  Chronic semibronchitic changes, right greater than left without definite superimpose acute cardiopulmonary disease. EKG shows normal sinus rhythm at 10.   ASSESSMENT: A 59 year old, Mr. Rueb, with history of chronic obstructive pulmonary disease, anxiety came in with shortness of breath, cough and tachycardia. He is being admitted with:  1. Acute on chronic obstructive pulmonary disease exacerbation with acute bronchitis. We will give him intravenous Solu-Medrol around the clock along with intravenous Levaquin and nebulizer oral inhalers. Continue oxygen, wean as tolerated.  2. History of deep vein thrombosis. The patient is to finish his treatment with Xarelto.  We will give him heparin for deep vein thrombosis prophylaxis.  3. Chronic anxiety. The patient is already on Prozac. We will continue  that.  4. Neuropathy, continue Lyrica and gabapentin.  5. History of chronic alcoholism. The patient reports he was at [place] in December 2015. His last drink of alcohol was before Christmas. He has not drunk since then. We will continue to monitor his symptoms.  I will hold off on [facility] Withdrawal [facility] protocol at present.   Above was discussed with patient.  No family members present.    TIME SPENT: 40 minutes.    ____________________________ Hart Rochester Posey Pronto, MD sap:by D: 07/10/2014 19:19:00 ET T: 07/10/2014 19:39:35 ET JOB#: [id number]  cc: Mikeal Hawthorne. Brynda Greathouse, MD Niccole Witthuhn A. Posey Pronto, MD, <Dictator>   Ilda Basset MD ELECTRONICALLY SIGNED 07/11/2014 12:20

## 2014-10-26 NOTE — Consult Note (Signed)
PATIENT NAME:  Steven Wiley, Steven Wiley MR#:  [id number] DATE OF BIRTH:  28-Mar-1956  DATE OF CONSULTATION:  07/12/2014  REFERRING PHYSICIAN:   CONSULTING PHYSICIAN:  Allyne Gee, MD   REASON FOR CONSULTATION: The patient being seen for respiratory failure and shortness of breath.   HISTORY OF PRESENT ILLNESS: The patient is a 59 year old gentleman who normally sees Dr. Brynda Greathouse, presents to the hospital with increasing shortness of breath. He has been having cough, but producing sputum, has had thick sputum, no blood in it. The patient was noted to have saturations in the 80s on presentation. The patient is not apparently on oxygen at home. No chest pain, no palpitations noted. No headaches. No dizziness no syncope. No nausea, vomiting, or diarrhea. No rashes are noted.   PAST MEDICAL HISTORY: Significant for COPD, DVT, gastroesophageal reflux, alcohol abuse, and history of anxiety.   FAMILY HISTORY: Significant for colon cancer  SOCIAL HISTORY: He was a former smoker up until about a year ago and has had issues with alcohol.   ALLERGIES: PENICILLIN, VICODIN, AND TYLENOL.   MEDICATIONS: Reviewed on the electronic medical record.   REVIEW OF SYSTEMS: A complete 12-point review of systems is performed and was unremarkable other than what was noted above in the HPI.   PHYSICAL EXAMINATION: VITAL SIGNS: Temperature was 97.5, pulse 59, respiratory rate 20, blood pressure 115/67, and saturations were 89%.  NECK: Supple. No JVD. No adenopathy, no thyromegaly.  CHEST: Coarse breath sounds, some rhonchi. No rales. Expansion is equal.  CARDIOVASCULAR: S1, S2 is normal. Regular rhythm. No gallop or rub.  ABDOMEN: Soft, nontender.  NEUROLOGIC: The patient was awake, appeared anxious, moving all 4 extremities. Gait was not checked.  SKIN: Without acute rashes.  EXTREMITIES: Without edema. No cyanosis, clubbing,  pulses equal.   LABORATORY DATA RESULTS: Chest x-ray shows no acute infiltrates, pneumothorax, or  effusion. The patient's other labs that were done show a white count of 10.2, hemoglobin 13.3, hematocrit 40.9. The patient's chemistries showed a sodium 140, potassium 2.8, BUN is 16, creatinine 1.3. Troponins have been negative.   IMPRESSION: Chronic obstructive pulmonary disease exacerbation.   PLAN: The patient is going to be continued on IV steroids and antibiotics, and we will continue aggressive nebulizer therapy. Also, need to make sure to control his anxiety issues adequately, as may be also playing a role in his breathing issues. Continue with supportive care, and follow along and make further recommendations as needed. Overall prognosis is guarded.     ____________________________ Allyne Gee, MD sak:mw D: 07/12/2014 10:30:44 ET T: 07/12/2014 13:35:49 ET JOB#: [id number]  cc: Allyne Gee, MD, <Dictator> Allyne Gee MD ELECTRONICALLY SIGNED 08/17/2014 12:34

## 2014-10-26 NOTE — H&P (Signed)
PATIENT NAME:  Steven Wiley, Steven Wiley MR#:  [id number] DATE OF BIRTH:  Jun 26, 1956  DATE OF ADMISSION:  09/18/2014  PRIMARY CARE PHYSICIAN:  Nicky Pugh, MD   EMERGENCY ROOM PHYSICIAN: Carrie Mew, MD   CHIEF COMPLAINT: Shortness of breath.   HISTORY OF PRESENT ILLNESS: The patient is a 59 year old male patient with history of chronic obstructive pulmonary disease, brought in because of shortness of breath and hypoxia. The patient went to see Dr. Humphrey Rolls for follow-up from his last hospitalization.  He was found to have O2 sats of 85% on room air so he was sent into the Emergency Room.  The patient has a recent hospitalization significant for COPD exacerbation, admitted from 09/03/2014 and discharged home on  prednisone.  He was admitted for acute on chronic COPD and respiratory failure at that time. The patient has been in the Intensive Care Unit on BiPAP for some time and then moved to a regular floor.  The patient also was given Levaquin 500 mg for 2 days. Today he went to see Dr. Humphrey Rolls for follow-up and was noted to have hypoxia with 85% sats on room air. The patient complains of cough, shortness of breath and phlegm for about the last 3 to 4 days, associated with fever sensation.  The patient was given oxygen and we confirmed that with home care agency, they delivered home oxygen 2 to 3 liters at home.    Right now, he is on 3 liters with O2 saturation is 94%.  The patient has a known history of chronic pain syndrome and also manipulative behavior and also he can produce wheezing with normal oxygenation.  He has been followed with pain management and according to him he saw the pain management specialist about 2 years ago. He has a history of neuropathy and chronic pain syndrome and requests pain medications on a scheduled basis.   PAST MEDICAL HISTORY: Significant for neuropathy, GERD, tobacco use disorder, history of COPD using Tudorza.  He has chronic pain syndrome also.  MEDICATIONS AT HOME:  Neurontin  300 mg 4 times daily, Lyrica 50 mg t.i.d., Prozac 40 mg daily, trazodone 100 mg at bedtime, melatonin 5 mg at bedtime, Tudorza 400 mcg 1 puff daily, prazosin 1 mg p.o. at bedtime, multivitamin 1 tablet daily, Spiriva Ellipta 200-25 mcg 1 puff daily, Ventolin inhaler 2 puffs 4 times daily, OxyContin 20 mg that is given during the last discharge.  He is also on Singulair 10 mg daily. Famotidine 20 mg b.i.d., theophylline 200 mg daily. DuoNebs as needed for wheezing.   ALLERGIES:  PENICILLIN. SPIRIVA, VICODIN.   SOCIAL HISTORY: Quit smoking a year ago. No alcohol. No drugs.   PAST MEDICAL HISTORY:  History of deep venous thrombosis. The patient finished Xarelto therapy.   REVIEW OF SYSTEMS: The patient complains of tiredness, cough and shortness of breath.  EYES: No blurred vision.  ENT: No tinnitus. No epistaxis. No difficulty swallowing.  RESPIRATORY: Does have cough, shortness of breath and wheezing.  GASTROINTESTINAL: No nausea. No vomiting. No abdominal pain.  GENITOURINARY: No dysuria or hematuria.  ENDOCRINE: No polyuria or nocturia.  MUSCULOSKELETAL: No joint pains. The patient has chronic pain syndrome, complains of chronic back pain.  NEUROLOGIC: Has a history of neuropathy. Denies any tremors, vertigo. PSYCHIATRIC: The patient does have anxiety.   PHYSICAL EXAMINATION:  VITAL SIGNS:  Temperature 97.9, heart rate 89, blood pressure 105/55, O2 saturations 94% on 4 liters, 84% on room air, GENERAL:  A well-nourished male patient not in distress,  answering questions appropriately.  HEENT: PERRLA.   HEAD: Atraumatic, normocephalic.  EYES: Pupils equal, reacting to light. Extraocular movements intact.  EARS, NOSE, AND THROAT: No tympanic membrane, congestion. No turbinate hypertrophy ,no oropharygeal erythema NECK: Supple. No JVD. No carotid bruit. No lymphadenopathy.  CARDIOVASCULAR: S1, S2 regular. No murmurs. The patient has good pedal pulses, good femoral pulse.  No extremity edema.   LUNGS: Bilateral expiratory wheeze present in all lung fields, not using accessory muscles of respiration.  ABDOMEN: Soft, nontender, nondistended. Bowel sounds present.  MUSCULOSKELETAL: Strength 5/5 in upper and lower extremities.  SKIN: No skin rashes. Warm and dry.  NEUROLOGIC: Cranial nerves II through XII are intact. Power 5/5 in upper and lower extremities. No dysarthria or aphasia.   LABORATORY DATA: WBC 11, hemoglobin 11.5, hematocrit 36.5, platelets 165,000.   Electrolytes: Sodium 138, potassium 4.1, chloride 98, bicarbonate 30, BUN 10, creatinine 0.7, serum glucose 93. Troponin 0.03.     Chest x-ray negative for any infiltrates.   ASSESSMENT AND PLAN:  1.  The patient is a 59 year old male with mild chronic obstructive pulmonary disease exacerbation.   The patient will not need any antibiotics because he just finished antibiotics and he if he has no evidence of fever, does have leukocytosis, likely from steroids, so hold off on the antibiotics. Continue his nebulizers with albuterol and Atrovent, start him on his Tudorza, continue theophylline and continue oxygen to keep sats more than 90%.  The patient is already set up with home oxygen.  Because of his wheezing, he is going to be observed overnight; hold off on IV steroids at this time.  He just finished p.o. steroids and he hasno pneumonia  chest x-ray, so continue p.o. prednisone 30 mg daily.  2.  History of neuropathy. Continue Neurontin and fluoxetine.  3.  Hypertension, continue his metoprolol 25 mg b.i.d.  4.  History of chronic pain syndrome. Continue OxyContin at 10 mg b.i.d.  5.  Advised to see his pain management doctor for his routine pain medications. The patient can continue his Neurontin and Lyrica for his neuropathy.   TIME SPENT: More than 50 minutes.     ____________________________ Epifanio Lesches, MD sk:DT D: 09/18/2014 15:52:09 ET T: 09/18/2014 16:38:38 ET JOB#: [id number]  cc: Epifanio Lesches, MD,  <Dictator> Epifanio Lesches MD ELECTRONICALLY SIGNED 10/07/2014 12:23

## 2014-10-26 NOTE — Discharge Summary (Signed)
PATIENT NAME:  Steven Wiley, Steven Wiley MR#:  [id number] DATE OF BIRTH:  05-06-1956  DATE OF ADMISSION:  09/03/2014 DATE OF DISCHARGE:  09/10/2014  ADMITTING PHYSICIAN:  Demetrios Loll, MD.   DISCHARGING PHYSICIAN:  Gladstone Lighter, MD.    PRIMARY CARE PHYSICIAN: Nicky Pugh, MD.      [place]:  Pulmonary consultation by Vilinda Boehringer, MD.    DISCHARGE DIAGNOSES:  1. Acute hypoxic respiratory failure.  2. Acute on chronic obstructive pulmonary disease exacerbation.  3. Chronic pain syndrome.  4. Tobacco use disorder.  5. Neuropathy.  6. Gastroesophageal reflex disease.   DISCHARGE HOME MEDICATIONS:     1. Gabapentin 300 mg p.o. 4 times daily.  2. Lyrica 50 mg p.o. 3 times a day.  3. Prozac 40 mg p.o. once daily.  4. Trazodone 100 mg p.o. at bedtime.  5. Melatonin 5 mg p.o. at bedtime.  6. Tums 1000 mg capsule 1-2 times a day as needed for GERD symptoms.  7. Tudorza 400 mcg 1 puff daily.  8. Calcium/vitamin D 600 mg/800 international units 1 tablet p.o. b.i.d.  9. Prazosin 1 mg p.o. at bedtime.  10. Probiotic 1 capsule p.o. b.i.d.  11. Multivitamin 1 tablet p.o. daily.  12. Spiriva Ellipta 200 mcg/25 mcg 1 puff daily.  13. Ventolin inhaler 2 puffs 4 times a day as needed.    14. Senna 8.6 mg p.o. at bedtime.  15. OxyContin 20 mg p.o. b.i.d. for 5 days.  16. Oxycodone 10 mg p.o. q. 6 hours p.r.n. for pain.  17. Prednisone taper over 5 days.  18. Maalox 30 mL q. 6 hours p.r.n. for indigestion.  19. Levaquin 500 mg p.o. daily for 3 more days.  20. Singular 10 mg p.o. daily.  21. Colace 100 mg 2 capsules p.o. b.i.d.    22. Famotidine 20 mg p.o. b.i.d.  23. Theophylline 200 mg p.o. daily.  24. DuoNebs with albuterol ipratropium 3 mL q. 6 hours p.r.n. for shortness of breath.  25. Tessalon Perles 100 mg p.o. q. 6 hours.  26. Nystatin suspension 5 mL q. 6 hours for 5 more days swish and swallow.   DISCHARGE DIET: Low-sodium diet.    DISCHARGE OXYGEN: None.    DISCHARGE ACTIVITY: As tolerated.    FOLLOWUP INSTRUCTIONS:  1.  Pain management followup in 1 week.  2.  PCP followup in 1-2 weeks.   LABORATORIES AND IMAGING STUDIES PRIOR TO DISCHARGE:  1.  WBC is 6.6, hemoglobin 11.6, hematocrit 36.9, platelet count is 348,000.  2.  Sodium 136, potassium 4.9, chloride 94, bicarbonate 35, BUN 5, creatinine 0.96, glucose 188, calcium of 9.3.  3.  Blood cultures are negative.  4.  Chest x-ray on 09/06/2014 showing chronic findings in lungs related to chronic bronchitis, emphysema, postinfectious scarring. No evidence of any acute cardiopulmonary disease noted.   BRIEF HOSPITAL COURSE: Steven Wiley is a 59 year old Caucasian male with past medical history significant for chronic respiratory failure secondary to COPD, gastroesophageal reflex disease, anxiety, depression, chronic pain, and chronic alcoholism, who presented to the hospital secondary to shortness of breath and coughing and wheezing.   1.  Acute respiratory failure secondary to acute on chronic COPD exacerbation. The patient was initially placed on BiPAP, was admitted to ICU, and then gradually weaned off oxygen. He does have oxygen as needed at home, has not required oxygen at the time of discharge here as his saturations were maintained greater than 90%. The patient is likely manipulative and tries to get  a lot of attention. He do some voluntary wheezing at times too. His oxygenation was fine. PT recommended rehabilitation, however the patient had some personality and behavior issues at prior rehabilitations, and he was ambulating more than 80 feet without any assistance, so he is being discharged home. He is wheelchair-bound, has a walker and wheelchair at home at this point.  He is on inhalers, prednisone taper, and his Levaquin will be continued to finish off the course. His last x-ray showing only chronic changes without any acute infiltrate. He was also started on Singulair and theophylline in the  hospital which he will continue.  2.  Chronic pain on OxyContin, oxycodone, requesting for more pain medications, however he was advised to follow up with his pain management clinic. He does have neuropathy and takes gabapentin, Lyrica, along with the narcotics.   3.  Oral thrush from steroids in the hospital, started on nystatin.  4.  Gastroesophageal reflex disease. The patient on famotidine, Tums, and Maalox.  5.  His course has been otherwise uneventful in the hospital.   DISCHARGE CONDITION: Stable.   DISCHARGE DISPOSITION:  Home with home health.    TIME SPENT ON DISCHARGE: 40 minutes.    ____________________________ Gladstone Lighter, MD rk:bu D: 09/10/2014 16:17:34 ET T: 09/10/2014 20:03:17 ET JOB#: [id number]  cc: Gladstone Lighter, MD, <Dictator> Mikeal Hawthorne. Brynda Greathouse, MD Gladstone Lighter MD ELECTRONICALLY SIGNED 09/11/2014 18:37

## 2014-10-26 NOTE — Discharge Summary (Signed)
PATIENT NAME:  Steven Wiley, [name] MR#:  [id number] DATE OF BIRTH:  1955-07-08  DATE OF ADMISSION:  07/10/2014 DATE OF DISCHARGE:  07/17/2014  PRIMARY CARE PHYSICIAN: Jaquelyn Bitter B. Brynda Greathouse, MD   DISCHARGE DIAGNOSES: 1. Acute respiratory failure with hypoxia.  2. Chronic obstructive pulmonary disease exacerbation.  3. Acute bronchitis with pneumonia.  4. Dehydration.  5. Chronic back pain.  6. Neuropathy.  7. Anxiety.  CONDITION: Stable.   CODE STATUS: Full code.   HOME MEDICATIONS: Please refer to the medication reconciliation list.   DIET: Regular diet.   ACTIVITY: As tolerated.   FOLLOWUP CARE: Follow with PCP and pulmonary Dr. Humphrey Rolls within 1-2 weeks.   REASON FOR ADMISSION: Cough and shortness of breath for 2 days.   HOSPITAL COURSE: The patient is a 59 year old Caucasian gentleman with a history of chronic obstructive pulmonary disease , anxiety, neuropathy, and deep vein thrombosis, who presented to the Emergency Department with shortness of breath and cough for 2-3 days. The patient was found hypoxic with oxygen saturation at 80s. He received Solu-Medrol and Levaquin. Chest x-ray was negative for pneumonia. For a detailed history and physical examination, please refer to the admission note dictated by Dr. Fritzi Mandes. Laboratory data on admission date showed CBC was in normal limits. Potassium 2.8, chloride 95, bicarbonate, 37. The patient was admitted for acute on chronic obstructive pulmonary disease exacerbation with acute bronchitis. After admission, the patient has been treated with IV Solu-Medrol, Levaquin, and nebulizer treatment.  1. The patient continues to have wheezing, cough, shortness of breath, was on oxygen 2.5 liters. Repeat chest x-ray shows right side pneumonia. Sputum culture shows Haemophilus influenzae. The patient has been treated with Levaquin. Since the patient continuously has wheezing, cough, and shortness of breath, Dr. Humphrey Rolls suggested theophylline and Singulair. The  patient cannot take ADVAIR and SPIRIVA due to allergy history. The patient's symptoms are getting better, but still has some mild wheezing. We will change to prednisone taper after discharge.  2. Dehydration, the patient has been treated with IV fluid support. Encouraged the patient to increase oral fluid intake.  3. Hypokalemia. The patient's potassium was low. After treatment, the patient's potassium is normal.  4. The patient's symptoms have much improved, but still has a mild cough and wheezing. The patient is off oxygen with oxygen saturation 94%,  but the patient has home oxygen 2 liters. We will continue oxygen 1-2 liters by nasal cannula p.r.n. after discharge. I discussed the patient's discharge plan with the patient and nurse and the social worker and the case manager. The patient will be discharged to a skilled nursing facility today.   TIME SPENT: About 45 minutes.    ____________________________ Demetrios Loll, MD qc:mw D: 07/17/2014 12:17:46 ET T: 07/17/2014 12:58:11 ET JOB#: [id number]  cc: Demetrios Loll, MD, <Dictator> Demetrios Loll MD ELECTRONICALLY SIGNED 07/17/2014 16:17

## 2014-10-26 NOTE — Discharge Summary (Signed)
PATIENT NAME:  Steven Wiley, Steven Wiley MR#:  [id number] DATE OF BIRTH:  03-Sep-1955  DATE OF ADMISSION:  09/18/2014 DATE OF DISCHARGE:    ADMITTING DIAGNOSIS: Shortness of breath.   DISCHARGE DIAGNOSES:  1. Shortness of breath due to acute on chronic obstructive pulmonary disease exacerbation.  2. Medical noncompliance.  3. Chronic brain syndrome.  4. Neuropathy.  5. Gastroesophageal reflux disease 6. Tobacco use disorder.  7. Chronic obstructive pulmonary disease.   CONSULTANTS: None.   PERTINENT LABORATORIES AND EVALUATIONS: Admitting glucose 93, BUN 10, creatinine 0.77, sodium 138, potassium 4.1, chloride 98, calcium 8.7. Troponin less than 0.03. WBC 11, hemoglobin 11.5, platelet count 165,000.   HOSPITAL COURSE: Please refer to H and P done by the admitting physician. The patient is a 59 year old white male who has had recurrent admissions to the hospital for COPD exacerbation. He has had 4 admissions in the past 6 months as well as 2 ER visits, who has been referred to [facility], but he keeps moving around and does not follow up appropriately. Presented with some shortness of breath and cough. The patient was noted to have acute COPD exacerbation and acute bronchitis. The patient also continued to ask for pain medications due to him having chronic pain. The patient was slow to improve and continued to complain of breathing trouble. At this time his breathing is stable enough that he is stable for discharge. I strongly recommended that he stop smoking as well as comply with this regimen. The patient is stable for discharge.    DISCHARGE MEDICATIONS: Lorazepam 0.5 mg 1 tab p.o. q. 12 p.r.n., gabapentin 300 one tab 4 times a day, Lyrica 50 one tab p.o. t.i.d., Prozac 40 daily, trazodone 100 at bedtime, melatonin 5 mg at bedtime, TUMS 500 two tabs 1-2 times as needed, Tudorza 1 puff daily, calcium plus vitamin D 1 tab p.o. b.i.d., prazosin 1 mg at bedtime, probiotic 1 tab p.o. b.i.d.,  (Dictation Anomaly)  multivitamins with mineral 1 tab p.o. daily, montelukast 10 mg daily, Colace 200 mg 2 caps b.i.d., famotidine 20 one tab p.o. b.i.d., Brilinta 200 mcg 1 puff daily, theophylline 200 daily, albuterol ipratropium q. 6 p.r.n., nystatin 5 mL swish and swallow q. 6 hours, Ventolin 2 puffs 4 times a day, senna 1 tab p.o. at bedtime, oxycodone 10 mg q. 6 p.r.n. for moderate pain, oxycodone 20 mg 1 tablet p.o. q. 12, Ceftin 500 one tab p.o. q. 12 x 5 days, guaifenesin 600 one tab p.o. b.i.d., prednisone taper starting at 60 mg taper until complete, O2 at 2 liters nasal cannula.     Home health services, nurse, and social worker referral.   ACTIVITY: As tolerated.   FOLLOWUP:  Follow up with primary MD in 1-2 weeks.    TIME SPENT ON THIS DISCHARGE: 35 minutes.      ____________________________ Lafonda Mosses Posey Pronto, MD shp:bu D: 09/22/2014 14:56:33 ET T: 09/22/2014 15:26:01 ET JOB#: [id number]  cc: Melis Trochez H. Posey Pronto, MD, <Dictator> Alric Seton MD ELECTRONICALLY SIGNED 09/26/2014 14:16

## 2014-10-26 NOTE — H&P (Signed)
PATIENT NAME:  Steven Wiley, Steven Wiley MR#:  [id number] DATE OF BIRTH:  02-18-1956  DATE OF ADMISSION:  09/03/2014  PRIMARY CARE PHYSICIAN: Dr. Brynda Greathouse.  REFERRING PHYSICIAN: Dr. Reita Cliche  CHIEF COMPLAINT: Shortness of breath, cough and wheezing for the past 1 week.   HISTORY OF PRESENT ILLNESS: A 59 year old Caucasian male with a history of COPD presented to the ED with the above chief complaint. The patient is alert, awake, oriented, and in no acute distress. The patient said that he has been feeling sick for the past 1 week. He has a cough, sputum, shortness of breath, and wheezing for 1 week. In addition, the patient complains of fever and chills. The patient also has chest tightness and chest pain while coughing and taking deep breaths. The patient denies any orthopnea, nocturnal dyspnea, or leg edema. The patient was discharged to a rehabilitation facility on January 21 after treatment of COPD exacerbation. She was on 2.5 liters of oxygen. The patient was discharged from rehabilitation without oxygen 4 weeks ago. The patient's O2 saturation decreased to 86% with oxygen in the ED. Chest x-ray showed bronchitis or atypical pneumonia. The patient was treated with Levaquin in the ED.   PAST MEDICAL HISTORY: COPD, DVT, finished oral Xarelto therapy, GERD, anxiety, chronic alcoholism.   SOCIAL HISTORY: Quit smoking 1 year ago. Denies any alcohol drinking or illicit drugs.   FAMILY HISTORY: Parents both died of colon cancer.    PAST SURGICAL HISTORY: Back surgery, cataract surgery in both eyes, right femur and left arm.   ALLERGIES: PENICILLIN, [place], [place], [place].   HOME MEDICATIONS: Ventolin CFC free 90 mcg 2 puffs inhaled 4 times a day p.r.n., TUMS 500 mg p.o. 2 tablets 1 to 2 times a day p.r.n., Tudorza Pressair 400 mcg 1 puff once a day, trazodone 100 mg p.o. at bedtime, Thera-M multivitamin with minerals p.o. tablets 1 tablet once a day, senna 8.6 mg p.o. tablets once a day at bedtime, Prozac 40 mg  p.o. daily, probiotic formula 1 capsule twice a day, prazosin 1 mg p.o. once a day at bedtime, oxycodone 20 mg p.o. b.i.d., oxycodone 10 mg every 6 hours p.r.n., melatonin 5 mg p.o. at bedtime, Lyrica 50 mg p.o. t.i.d., gabapentin 300 mg 4 times a day, calcium 600 plus D 600 mg/800 international units p.o. tablets twice a day, Breo Ellipta 200 mcg/20 mcg 1 puff once a day.    REVIEW OF SYSTEMS:  CONSTITUTIONAL: The patient denies any fever or chills. No headache or dizziness, but has weakness.  EYES: No double vision or blurred vision.  EARS, NOSE, AND THROAT: No postnasal drip, slurred speech or dysphagia.  CARDIOVASCULAR: Positive for chest pain while coughing. No palpitations. No orthopnea. No nocturnal dyspnea. No leg edema.  PULMONARY: Positive for cough, sputum, shortness of breath and wheezing. No hemoptysis.  GASTROINTESTINAL: No abdominal pain, nausea, vomiting, diarrhea. No melena or bloody stool.  GENITOURINARY: No dysuria, hematuria, or incontinence.  SKIN: No rash or jaundice.  NEUROLOGIC: No syncope, loss of consciousness, or seizure.  ENDOCRINE: No polyuria, polydipsia, heat or cold intolerance.  HEMATOLOGY: No easy bruising or bleeding.   PHYSICAL EXAMINATION:  VITAL SIGNS: Blood pressure 120/66, pulse 76, respirations 18, oxygen saturation 91% on 6 liters oxygen by nasal cannula. GENERAL: The patient is alert, awake, oriented, in no acute distress. In mild respiratory distress.  HEENT: Pupils round, equal, and reactive to light and accommodation. Dry oral mucosa. Clear pharynx.  NECK: Supple. No JVD or carotid bruit. No lymphadenopathy. No  thyromegaly.  CARDIOVASCULAR: S1 and S2. Regular rate and rhythm. No murmurs, gallop.  PULMONARY: Bilateral air entry. Bilateral severe expiratory wheezing and rhonchi with crackles. No wheezing. Mild use of accessory muscle to breathe.  ABDOMEN: Soft. No distention or tenderness. No organomegaly. Bowel sounds present.  EXTREMITIES: No  edema, clubbing or cyanosis. No calf tenderness. Bilateral pedal pulses present.  SKIN: No rash or jaundice.  NEUROLOGIC: A and O x 3. No focal deficit. Power 5/5. Sensory intact.   LABORATORY DATA: Venous ABG showed a pH of 7.31, pCO2 of 69. A chest x-ray showed worsening of the chest radiograph. Widespread bronchial thickening and abnormal interstitial markings could be widespread bronchitis or atypical pneumonia. BNP 55. WBC 8.6, hemoglobin 12.2, platelets 360,000. Glucose 114, BUN 13, creatinine 1.13. Electrolytes are normal. Troponin less than 0.03. EKG showed normal sinus rhythm at 75 BPM.   IMPRESSIONS: 1.  Acute respiratory failure with hypoxia and hypercapnia.  2.  Chronic obstructive pulmonary disease exacerbation.  3.  Acute widespread bronchitis or atypical pneumonia.  4.  Neuropathy.  5.  Chronic anxiety.  6.  Gastroesophageal reflux disease.  PLAN OF TREATMENT: 1.  The patient will be admitted to a stepdown unit. The patient has acute respiratory failure with hypoxia and hypercapnia. Now on 6 liters oxygen by nasal cannula with O2 saturation about 89. The patient has a high risk for cardiopulmonary arrest. We will repeat an ABG and put the patient on BiPAP and admit her to a stepdown unit. We will start IV Solu-Medrol and DuoNeb q. 4 hours. The patient has an allergy to Spiriva and Advair. We will give Singulair and Theophylline and request a pulmonary consult from Dr. Humphrey Rolls.  2.  I will continue Levaquin, follow up CBC, blood culture, and sputum culture.  3.  Continue the patient's pain medication.   I discussed the patient's condition and plan of treatment with the patient. The patient wants full code.   CRITICAL TIME SPENT: About 65 minutes.    ____________________________ Demetrios Loll, MD qc:at D: 09/03/2014 14:58:10 ET T: 09/03/2014 16:04:53 ET JOB#: [id number]  cc: Demetrios Loll, MD, <Dictator> Demetrios Loll MD ELECTRONICALLY SIGNED 09/03/2014 22:45

## 2014-10-26 NOTE — Consult Note (Signed)
Chief Complaint:  Subjective/Chief Complaint patient was still having some SOB noted cannot take any inhalers due to allergy?   VITAL SIGNS/ANCILLARY NOTES: **Vital Signs.:   20-Jan-16 09:50  Vital Signs Type Routine  Temperature Temperature (F) 97.4  Celsius 36.3  Temperature Source oral  Pulse Pulse 80  Respirations Respirations 20  Systolic BP Systolic BP 242  Diastolic BP (mmHg) Diastolic BP (mmHg) 66  Mean BP 92  Pulse Ox % Pulse Ox % 94  Pulse Ox Activity Level  At rest  Oxygen Delivery Room Air/ 21 %  *Intake and Output.:   Shift 20-Jan-16 07:00  Grand Totals Intake:  500 Output:  400    Net:  100 24 Hr.:  536  IV (Primary)      In:  500  Urine ml     Out:  400  Length of Stay Totals Intake:  4593 Output:  6600    Net:  -2007   Brief Assessment:  GEN no acute distress   Cardiac Regular  --Gallop   Respiratory normal resp effort  rhonchi   Gastrointestinal details normal Soft  Nontender  Nondistended   EXTR negative cyanosis/clubbing   Lab Results: Routine Chem:  20-Jan-16 08:48   Glucose, Serum  169  BUN  25  Creatinine (comp) 0.92  Sodium, Serum 139  Potassium, Serum 4.7  Chloride, Serum 105  CO2, Serum 29  Calcium (Total), Serum  8.1  Anion Gap  5  Osmolality (calc) 286  eGFR (African American) >60  eGFR (Non-African American) >60 (eGFR values <61m/min/1.73 m2 may be an indication of chronic kidney disease (CKD). Calculated eGFR, using the MRDR Study equation, is useful in  patients with stable renal function. The eGFR calculation will not be reliable in acutely ill patients when serum creatinine is changing rapidly. It is not useful in patients on dialysis. The eGFR calculation may not be applicable to patients at the low and high extremes of body sizes, pregnant women, and vegetarians.)   Radiology Results: XRay:    17-Jan-16 10:17, Chest Portable Single View  Chest Portable Single View   REASON FOR EXAM:    pneumonia  COMMENTS:        PROCEDURE: DXR - DXR PORTABLE CHEST SINGLE VIEW  - Jul 13 2014 10:17AM     CLINICAL DATA:  59year old male with wheezing cough and pneumonia.  Initial encounter.    EXAM:  PORTABLE CHEST - 1 VIEW    COMPARISON:  07/10/2014 and earlier.    FINDINGS:  Portable AP upright view at 1009 hrs. Stable large lung volumes.  Stable cardiac size and mediastinal contours. No pneumothorax,  pulmonary edema or definite effusion. Chronic mild elevation of the  right hemidiaphragm. Stable confluent right apical opacity. A  chronic asymmetric increased curvilinear markings in the right lung.  Superimposed mildly increased nodularity compared to prior exams.     IMPRESSION:  Interval increased reticulonodular opacity in the mid right lung  compatible with acute infectious exacerbation superimposed on  chronic lung disease.      Electronically Signed    By: LLars PinksM.D.    On: 07/13/2014 10:39     Verified By: HGwenyth Bender HALL, M.D.,   Assessment/Plan:  Assessment/Plan:  Assessment 1. Acute COPD exacerbation -I have suggested to start on breo if available and tudorza if available -will start on theophylline -continue oxygen as tolerated -will require prolonged course of steroids -add singulair   Electronic Signatures: KAllyne Gee(MD)  (Signed 20-Jan-16 13:41)  Authored: Chief Complaint, VITAL SIGNS/ANCILLARY NOTES, Brief Assessment, Lab Results, Radiology Results, Assessment/Plan   Last Updated: 20-Jan-16 13:41 by Allyne Gee (MD)

## 2014-10-26 NOTE — H&P (Signed)
PATIENT NAME:  Steven Wiley, Steven Wiley MR#:  [id number] DATE OF BIRTH:  July 25, 1955  DATE OF ADMISSION:  07/10/2014  PRIMARY CARE PHYSICIAN:  Jaquelyn Bitter B. Brynda Greathouse, MD.  CHIEF COMPLAINT:  Cough and shortness of breath for 2 days.  Mr. Sangalang is a 59 year old Caucasian gentleman with history of DVT in the past, COPD, anxiety, neuropathy, and acid reflux comes to the Emergency Room with complaints of increasing shortness of breath and productive cough for last 2 or 3 days.  He feels like he is choking and is wanting to throw up. He was found to be hypoxic with saturations in the 80s; 87 on room air on arrival.  He received IV Solu-Medrol, IV Levaquin. Chest x-ray is negative for pneumonia. He is being admitted for COPD exacerbation and possible acute bronchitis.   PAST MEDICAL HISTORY:  1. COPD.   2. Remote ex-smoker.  3. History of DVT, finished oral Xarelto therapy.  4. Gastroesophageal reflux disease.  5. Anxiety.  6. Chronic alcoholism.   The patient was admitted at [facility] in December and has quit alcohol since Christmas.   SOCIAL HISTORY:  He was a smoker, quit a year ago.  No illicit drug use. Denies any alcohol use since December 20, since Christmastime.  Lives by himself.   FAMILY HISTORY: Mother and father both died from colon cancer, mother from old age.   ALLERGIES: PENICILLIN, TYLENOL AND VICODIN.   MEDICATIONS:  1. Prazosin 1 mg p.o. daily at bedtime.  2. Oxycodone 10 mg 1 tablet p.o. 4 times a day.  3. Lyrica 50 mg 4 times a day.  4. Lorazepam 0.5 mg 1 tablet 4 times a day.  5. Gabapentin 300 mg 2 capsules 3 times a day.   REVIEW OF SYSTEMS:     CONSTITUTIONAL: No fever. Positive for fatigue, weakness.  EYES: No blurred or double vision, glaucoma or cataracts.  ENT: No tinnitus, ear pain, hearing loss.  RESPIRATORY: Positive for shortness of breath, cough, COPD.   CARDIOVASCULAR: Chest pain from coughing spells. No hypertension or arrhythmia.  GASTROINTESTINAL: No nausea, vomiting,  diarrhea, or abdominal pain. No GERD.  GENITOURINARY: No dysuria, hematuria, frequency.  ENDOCRINE: No polyuria, nocturia or thyroid problems.  HEMATOLOGY: No anemia or easy bruising.  SKIN: No acne, rash or lesion.  MUSCULOSKELETAL: Positive for arthritis and chronic back pain.  NEUROLOGIC: No CVA, TIA.  Positive for neuropathy.  PSYCHIATRIC: Positive for anxiety. No depression or bipolar disorder.   All other systems reviewed and negative.    LABORATORY DATA:  PH is 7.42 pCO2 is 46, BNP is 1623.  CBC within normal limits. Comprehensive metabolic panel:  Potassium is 2.8, chloride is 95, bicarbonate is 37, SGPT is 66, SGOT is 43, albumin is 3.0.    IMAGING:  Chest x-ray:  Chronic semibronchitic changes, right greater than left without definite superimpose acute cardiopulmonary disease. EKG shows normal sinus rhythm at 10.   ASSESSMENT: A 59 year old, Mr. Broughton, with history of chronic obstructive pulmonary disease, anxiety came in with shortness of breath, cough and tachycardia. He is being admitted with:  1. Acute on chronic obstructive pulmonary disease exacerbation with acute bronchitis. We will give him intravenous Solu-Medrol around the clock along with intravenous Levaquin and nebulizer (Dictation Anomaly) <<MISSING TEXT>> inhalers. Continue oxygen, wean as tolerated.  2. History of deep vein thrombosis. The patient is to finish his treatment with Xarelto.  We will give him heparin for deep vein thrombosis prophylaxis.  3. Chronic anxiety. The patient is already on Prozac.  We will continue that.  4. Neuropathy, continue Lyrica and gabapentin.  5. History of chronic alcoholism. The patient reports he was at [facility] in December 2015. His last drink of alcohol was before Christmas. He has not drunk since then. We will continue to monitor his symptoms.  I will hold off on [facility] protocol at present.   Above was discussed with patient.   No family members present.   TIME SPENT: 40 minutes.     ____________________________ Carl Best, MD mge:by D: 07/10/2014 19:19:26 ET T: 07/10/2014 19:39:35 ET JOB#: [id number]  cc: Carl Best, MD, <Dictator> Mikeal Hawthorne. Brynda Greathouse, MD

## 2015-01-03 ENCOUNTER — Emergency Department: Payer: Medicaid Other

## 2015-01-03 ENCOUNTER — Encounter: Payer: Self-pay | Admitting: Emergency Medicine

## 2015-01-03 ENCOUNTER — Inpatient Hospital Stay
Admission: EM | Admit: 2015-01-03 | Discharge: 2015-01-09 | DRG: 189 | Disposition: A | Discharge status: Skilled Nursing Facility | Payer: Medicaid Other | Attending: Internal Medicine | Admitting: Internal Medicine

## 2015-01-03 DIAGNOSIS — J441 Chronic obstructive pulmonary disease with (acute) exacerbation: Secondary | ICD-10-CM | POA: Diagnosis present

## 2015-01-03 DIAGNOSIS — Z87891 Personal history of nicotine dependence: Secondary | ICD-10-CM | POA: Diagnosis not present

## 2015-01-03 DIAGNOSIS — G629 Polyneuropathy, unspecified: Secondary | ICD-10-CM | POA: Diagnosis present

## 2015-01-03 DIAGNOSIS — Z7951 Long term (current) use of inhaled steroids: Secondary | ICD-10-CM

## 2015-01-03 DIAGNOSIS — J0510 Acute epiglottitis without obstruction: Secondary | ICD-10-CM

## 2015-01-03 DIAGNOSIS — Z9981 Dependence on supplemental oxygen: Secondary | ICD-10-CM | POA: Diagnosis not present

## 2015-01-03 DIAGNOSIS — T380X5A Adverse effect of glucocorticoids and synthetic analogues, initial encounter: Secondary | ICD-10-CM | POA: Diagnosis present

## 2015-01-03 DIAGNOSIS — M542 Cervicalgia: Secondary | ICD-10-CM | POA: Diagnosis present

## 2015-01-03 DIAGNOSIS — Z79891 Long term (current) use of opiate analgesic: Secondary | ICD-10-CM | POA: Diagnosis not present

## 2015-01-03 DIAGNOSIS — G894 Chronic pain syndrome: Secondary | ICD-10-CM | POA: Diagnosis present

## 2015-01-03 DIAGNOSIS — R739 Hyperglycemia, unspecified: Secondary | ICD-10-CM | POA: Diagnosis present

## 2015-01-03 DIAGNOSIS — Z716 Tobacco abuse counseling: Secondary | ICD-10-CM | POA: Diagnosis not present

## 2015-01-03 DIAGNOSIS — F329 Major depressive disorder, single episode, unspecified: Secondary | ICD-10-CM | POA: Diagnosis present

## 2015-01-03 DIAGNOSIS — J45909 Unspecified asthma, uncomplicated: Secondary | ICD-10-CM | POA: Diagnosis present

## 2015-01-03 DIAGNOSIS — J9621 Acute and chronic respiratory failure with hypoxia: Secondary | ICD-10-CM | POA: Diagnosis not present

## 2015-01-03 DIAGNOSIS — Z880 Allergy status to penicillin: Secondary | ICD-10-CM | POA: Diagnosis not present

## 2015-01-03 DIAGNOSIS — M549 Dorsalgia, unspecified: Secondary | ICD-10-CM | POA: Diagnosis present

## 2015-01-03 DIAGNOSIS — G8929 Other chronic pain: Secondary | ICD-10-CM

## 2015-01-03 HISTORY — DX: Other chronic pain: G89.29

## 2015-01-03 HISTORY — DX: Dorsalgia, unspecified: M54.9

## 2015-01-03 HISTORY — DX: Gastro-esophageal reflux disease without esophagitis: K21.9

## 2015-01-03 HISTORY — DX: Anxiety disorder, unspecified: F41.9

## 2015-01-03 LAB — COMPREHENSIVE METABOLIC PANEL
ALT: 14 U/L — AB (ref 17–63)
AST: 27 U/L (ref 15–41)
Albumin: 3.7 g/dL (ref 3.5–5.0)
Alkaline Phosphatase: 92 U/L (ref 38–126)
Anion gap: 7 (ref 5–15)
BILIRUBIN TOTAL: 0.4 mg/dL (ref 0.3–1.2)
BUN: 11 mg/dL (ref 6–20)
CALCIUM: 8.5 mg/dL — AB (ref 8.9–10.3)
CO2: 33 mmol/L — ABNORMAL HIGH (ref 22–32)
Chloride: 97 mmol/L — ABNORMAL LOW (ref 101–111)
Creatinine, Ser: 1.02 mg/dL (ref 0.61–1.24)
GFR calc Af Amer: >60 mL/min (ref >60)
GLUCOSE: 97 mg/dL (ref 65–99)
POTASSIUM: 4.8 mmol/L (ref 3.5–5.1)
SODIUM: 137 mmol/L (ref 135–145)
Total Protein: 6.9 g/dL (ref 6.5–8.1)

## 2015-01-03 LAB — CBC WITH DIFFERENTIAL/PLATELET
BASOS ABS: 0 10*3/uL (ref 0–0.1)
Basophils Relative: 0 %
Eosinophils Absolute: 0.1 10*3/uL (ref 0–0.7)
Eosinophils Relative: 2 %
HCT: 43.3 % (ref 40.0–52.0)
Hemoglobin: 13.9 g/dL (ref 13.0–18.0)
Lymphocytes Relative: 25 %
Lymphs Abs: 1.5 10*3/uL (ref 1.0–3.6)
MCH: 27.6 pg (ref 26.0–34.0)
MCHC: 32.1 g/dL (ref 32.0–36.0)
MCV: 85.8 fL (ref 80.0–100.0)
Monocytes Absolute: 0.5 10*3/uL (ref 0.2–1.0)
Monocytes Relative: 9 %
Neutro Abs: 3.9 10*3/uL (ref 1.4–6.5)
Neutrophils Relative %: 64 %
Platelets: 154 10*3/uL (ref 150–440)
RBC: 5.05 MIL/uL (ref 4.40–5.90)
RDW: 17.7 % — ABNORMAL HIGH (ref 11.5–14.5)
WBC: 6.2 10*3/uL (ref 3.8–10.6)

## 2015-01-03 LAB — CREATININE, SERUM
Creatinine, Ser: 1.02 mg/dL (ref 0.61–1.24)
GFR calc Af Amer: >60 mL/min (ref >60)
GFR calc non Af Amer: >60 mL/min (ref >60)

## 2015-01-03 LAB — ETHANOL

## 2015-01-03 LAB — TROPONIN I: Troponin I: <0.03 ng/mL (ref <0.031)

## 2015-01-03 MED ORDER — ALBUTEROL SULFATE (2.5 MG/3ML) 0.083% IN NEBU
2.5000 mg | INHALATION_SOLUTION | Freq: Once | RESPIRATORY_TRACT | Status: AC
  Administered 2015-01-03: 2.5 mg via RESPIRATORY_TRACT

## 2015-01-03 MED ORDER — ENOXAPARIN SODIUM 40 MG/0.4ML ~~LOC~~ SOLN
40.0000 mg | SUBCUTANEOUS | Status: DC
  Administered 2015-01-03 – 2015-01-08 (×6): 40 mg via SUBCUTANEOUS
  Filled 2015-01-03 (×6): qty 0.4

## 2015-01-03 MED ORDER — SENNOSIDES-DOCUSATE SODIUM 8.6-50 MG PO TABS: 1.0000 | ORAL_TABLET | Freq: Every evening | ORAL | Status: DC | PRN

## 2015-01-03 MED ORDER — PREDNISONE 20 MG PO TABS
60.0000 mg | ORAL_TABLET | Freq: Once | ORAL | Status: AC
  Administered 2015-01-03: 60 mg via ORAL

## 2015-01-03 MED ORDER — OXYCODONE HCL 5 MG PO TABS
15.0000 mg | ORAL_TABLET | Freq: Four times a day (QID) | ORAL | Status: DC | PRN
  Administered 2015-01-03 – 2015-01-09 (×23): 15 mg via ORAL
  Filled 2015-01-03 (×23): qty 3

## 2015-01-03 MED ORDER — TRAZODONE HCL 100 MG PO TABS
200.0000 mg | ORAL_TABLET | Freq: Every day | ORAL | Status: DC
  Administered 2015-01-03 – 2015-01-09 (×5): 200 mg via ORAL
  Filled 2015-01-03 (×6): qty 2

## 2015-01-03 MED ORDER — ALBUTEROL SULFATE (2.5 MG/3ML) 0.083% IN NEBU
INHALATION_SOLUTION | RESPIRATORY_TRACT | Status: AC
  Administered 2015-01-03: 2.5 mg via RESPIRATORY_TRACT
  Filled 2015-01-03: qty 3

## 2015-01-03 MED ORDER — PREGABALIN 25 MG PO CAPS
25.0000 mg | ORAL_CAPSULE | Freq: Four times a day (QID) | ORAL | Status: DC
  Administered 2015-01-03 – 2015-01-09 (×22): 25 mg via ORAL
  Filled 2015-01-03 (×22): qty 1

## 2015-01-03 MED ORDER — MORPHINE SULFATE 4 MG/ML IJ SOLN
4.0000 mg | Freq: Once | INTRAMUSCULAR | Status: AC
  Administered 2015-01-03: 4 mg via INTRAMUSCULAR

## 2015-01-03 MED ORDER — ALUM & MAG HYDROXIDE-SIMETH 200-200-20 MG/5ML PO SUSP
30.0000 mL | Freq: Four times a day (QID) | ORAL | Status: DC | PRN
  Administered 2015-01-04 – 2015-01-07 (×3): 30 mL via ORAL
  Filled 2015-01-03 (×3): qty 30

## 2015-01-03 MED ORDER — PREDNISONE 20 MG PO TABS
40.0000 mg | ORAL_TABLET | Freq: Every day | ORAL | Status: DC
Start: 2015-01-03 — End: 2015-01-09

## 2015-01-03 MED ORDER — METHYLPREDNISOLONE SODIUM SUCC 125 MG IJ SOLR: 125.0000 mg | Freq: Once | INTRAMUSCULAR | Status: DC

## 2015-01-03 MED ORDER — BUDESONIDE 0.25 MG/2ML IN SUSP
0.2500 mg | Freq: Four times a day (QID) | RESPIRATORY_TRACT | Status: DC
  Filled 2015-01-03 (×7): qty 2

## 2015-01-03 MED ORDER — ALBUTEROL SULFATE HFA 108 (90 BASE) MCG/ACT IN AERS: 2.0000 | INHALATION_SPRAY | Freq: Four times a day (QID) | RESPIRATORY_TRACT | Status: DC | PRN

## 2015-01-03 MED ORDER — MORPHINE SULFATE 4 MG/ML IJ SOLN
INTRAMUSCULAR | Status: AC
  Administered 2015-01-03: 4 mg via INTRAMUSCULAR
  Filled 2015-01-03: qty 1

## 2015-01-03 MED ORDER — METHYLPREDNISOLONE SODIUM SUCC 125 MG IJ SOLR
INTRAMUSCULAR | Status: AC
  Administered 2015-01-03: 125 mg via INTRAVENOUS
  Filled 2015-01-03: qty 2

## 2015-01-03 MED ORDER — LEVALBUTEROL HCL 0.63 MG/3ML IN NEBU
0.6300 mg | INHALATION_SOLUTION | Freq: Four times a day (QID) | RESPIRATORY_TRACT | Status: DC | PRN
  Administered 2015-01-04: 0.63 mg via RESPIRATORY_TRACT
  Filled 2015-01-03: qty 3

## 2015-01-03 MED ORDER — METHYLPREDNISOLONE SODIUM SUCC 125 MG IJ SOLR
125.0000 mg | Freq: Once | INTRAMUSCULAR | Status: AC
  Administered 2015-01-03: 125 mg via INTRAVENOUS

## 2015-01-03 MED ORDER — METHYLPREDNISOLONE SODIUM SUCC 125 MG IJ SOLR
60.0000 mg | INTRAMUSCULAR | Status: DC
  Filled 2015-01-03: qty 2

## 2015-01-03 MED ORDER — TIOTROPIUM BROMIDE MONOHYDRATE 18 MCG IN CAPS
18.0000 ug | ORAL_CAPSULE | Freq: Every day | RESPIRATORY_TRACT | Status: DC
  Administered 2015-01-08: 18 ug via RESPIRATORY_TRACT
  Filled 2015-01-03: qty 5

## 2015-01-03 MED ORDER — PREDNISONE 20 MG PO TABS
ORAL_TABLET | ORAL | Status: AC
  Administered 2015-01-03: 60 mg via ORAL
  Filled 2015-01-03: qty 3

## 2015-01-03 MED ORDER — OXYCODONE HCL ER 20 MG PO T12A
20.0000 mg | EXTENDED_RELEASE_TABLET | Freq: Two times a day (BID) | ORAL | Status: DC
  Administered 2015-01-03 – 2015-01-09 (×12): 20 mg via ORAL
  Filled 2015-01-03 (×12): qty 1

## 2015-01-03 MED ORDER — FLUOXETINE HCL 20 MG PO CAPS
40.0000 mg | ORAL_CAPSULE | Freq: Every day | ORAL | Status: DC
  Administered 2015-01-03 – 2015-01-09 (×7): 40 mg via ORAL
  Filled 2015-01-03 (×7): qty 2

## 2015-01-03 NOTE — ED Notes (Signed)
Breathing tx administered , sats 99% , pt tolerateing well

## 2015-01-03 NOTE — ED Notes (Signed)
Spoke with admitting MD, per admitting MD pt is okay to be off tele as long as pt is maintaining his O2 sats.

## 2015-01-03 NOTE — Discharge Instructions (Signed)
Chronic Back Pain  When back pain lasts longer than 3 months, it is called chronic back pain.People with chronic back pain often go through certain periods that are more intense (flare-ups).  CAUSES Chronic back pain can be caused by wear and tear (degeneration) on different structures in your back. These structures include:  The bones of your spine (vertebrae) and the joints surrounding your spinal cord and nerve roots (facets).  The strong, fibrous tissues that connect your vertebrae (ligaments). Degeneration of these structures may result in pressure on your nerves. This can lead to constant pain. HOME CARE INSTRUCTIONS  Avoid bending, heavy lifting, prolonged sitting, and activities which make the problem worse.  Take brief periods of rest throughout the day to reduce your pain. Lying down or standing usually is better than sitting while you are resting.  Take over-the-counter or prescription medicines only as directed by your caregiver. SEEK IMMEDIATE MEDICAL CARE IF:   You have weakness or numbness in one of your legs or feet.  You have trouble controlling your bladder or bowels.  You have nausea, vomiting, abdominal pain, shortness of breath, or fainting. Document Released: 07/21/2004 Document Revised: 09/05/2011 Document Reviewed: 05/28/2011 [facility] Patient Information 2015 [place], [place]. This information is not intended to replace advice given to you by your health care provider. Make sure you discuss any questions you have with your health care provider.  Chronic Obstructive Pulmonary Disease Chronic obstructive pulmonary disease (COPD) is a common lung condition in which airflow from the lungs is limited. COPD is a general term that can be used to describe many different lung problems that limit airflow, including both chronic bronchitis and emphysema. If you have COPD, your lung function will probably never return to normal, but there are measures you can take to improve  lung function and make yourself feel better.  CAUSES   Smoking (common).   Exposure to secondhand smoke.   Genetic problems.  Chronic inflammatory lung diseases or recurrent infections. SYMPTOMS   Shortness of breath, especially with physical activity.   Deep, persistent (chronic) cough with a large amount of thick mucus.   Wheezing.   Rapid breaths (tachypnea).   Gray or bluish discoloration (cyanosis) of the skin, especially in fingers, toes, or lips.   Fatigue.   Weight loss.   Frequent infections or episodes when breathing symptoms become much worse (exacerbations).   Chest tightness. DIAGNOSIS  Your health care provider will take a medical history and perform a physical examination to make the initial diagnosis. Additional tests for COPD may include:   Lung (pulmonary) function tests.  Chest X-ray.  CT scan.  Blood tests. TREATMENT  Treatment available to help you feel better when you have COPD includes:   Inhaler and nebulizer medicines. These help manage the symptoms of COPD and make your breathing more comfortable.  Supplemental oxygen. Supplemental oxygen is only helpful if you have a low oxygen level in your blood.   Exercise and physical activity. These are beneficial for nearly all people with COPD. Some people may also benefit from a pulmonary rehabilitation program. HOME CARE INSTRUCTIONS   Take all medicines (inhaled or pills) as directed by your health care provider.  Avoid over-the-counter medicines or cough syrups that dry up your airway (such as antihistamines) and slow down the elimination of secretions unless instructed otherwise by your health care provider.   If you are a smoker, the most important thing that you can do is stop smoking. Continuing to smoke will cause further  lung damage and breathing trouble. Ask your health care provider for help with quitting smoking. He or she can direct you to community resources or  hospitals that provide support.  Avoid exposure to irritants such as smoke, chemicals, and fumes that aggravate your breathing.  Use oxygen therapy and pulmonary rehabilitation if directed by your health care provider. If you require home oxygen therapy, ask your health care provider whether you should purchase a pulse oximeter to measure your oxygen level at home.   Avoid contact with individuals who have a contagious illness.  Avoid extreme temperature and humidity changes.  Eat healthy foods. Eating smaller, more frequent meals and resting before meals may help you maintain your strength.  Stay active, but balance activity with periods of rest. Exercise and physical activity will help you maintain your ability to do things you want to do.  Preventing infection and hospitalization is very important when you have COPD. Make sure to receive all the vaccines your health care provider recommends, especially the pneumococcal and influenza vaccines. Ask your health care provider whether you need a pneumonia vaccine.  Learn and use relaxation techniques to manage stress.  Learn and use controlled breathing techniques as directed by your health care provider. Controlled breathing techniques include:   Pursed lip breathing. Start by breathing in (inhaling) through your nose for 1 second. Then, purse your lips as if you were going to whistle and breathe out (exhale) through the pursed lips for 2 seconds.   Diaphragmatic breathing. Start by putting one hand on your abdomen just above your waist. Inhale slowly through your nose. The hand on your abdomen should move out. Then purse your lips and exhale slowly. You should be able to feel the hand on your abdomen moving in as you exhale.   Learn and use controlled coughing to clear mucus from your lungs. Controlled coughing is a series of short, progressive coughs. The steps of controlled coughing are:  1. Lean your head slightly forward.   2. Breathe in deeply using diaphragmatic breathing.  3. Try to hold your breath for 3 seconds.  4. Keep your mouth slightly open while coughing twice.  5. Spit any mucus out into a tissue.  6. Rest and repeat the steps once or twice as needed. SEEK MEDICAL CARE IF:   You are coughing up more mucus than usual.   There is a change in the color or thickness of your mucus.   Your breathing is more labored than usual.   Your breathing is faster than usual.  SEEK IMMEDIATE MEDICAL CARE IF:   You have shortness of breath while you are resting.   You have shortness of breath that prevents you from:  Being able to talk.   Performing your usual physical activities.   You have chest pain lasting longer than 5 minutes.   Your skin color is more cyanotic than usual.  You measure low oxygen saturations for longer than 5 minutes with a pulse oximeter. MAKE SURE YOU:   Understand these instructions.  Will watch your condition.  Will get help right away if you are not doing well or get worse. Document Released: 03/23/2005 Document Revised: 10/28/2013 Document Reviewed: 02/07/2013 [facility] Patient Information 2015 [place], [place]. This information is not intended to replace advice given to you by your health care provider. Make sure you discuss any questions you have with your health care provider.     You have been seen in the emergency department for back pain and  trouble breathing. Your work and was most consistent with a COPD exacerbation. Please take your prescribed steroid and use her albuterol inhaler as needed, as written for shortness of breath. Please follow-up with your primary care provider soon as possible regarding today's ER visit, your shortness breath, and your continued back pain. Return to the emergency department for any chest pain, increased shortness of breath, or any other personally concerning symptoms.

## 2015-01-03 NOTE — ED Notes (Signed)
Patient returned from X-ray 
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
## 2015-01-04 MED ORDER — METHYLPREDNISOLONE SODIUM SUCC 125 MG IJ SOLR
60.0000 mg | Freq: Three times a day (TID) | INTRAMUSCULAR | Status: DC
  Administered 2015-01-04 – 2015-01-08 (×12): 60 mg via INTRAVENOUS
  Filled 2015-01-04 (×11): qty 2

## 2015-01-04 MED ORDER — CHLORHEXIDINE GLUCONATE 0.12 % MT SOLN
15.0000 mL | Freq: Two times a day (BID) | OROMUCOSAL | Status: DC
  Administered 2015-01-05 – 2015-01-08 (×6): 15 mL via OROMUCOSAL

## 2015-01-04 MED ORDER — BUDESONIDE 0.5 MG/2ML IN SUSP
0.5000 mg | Freq: Two times a day (BID) | RESPIRATORY_TRACT | Status: DC
  Administered 2015-01-05 – 2015-01-09 (×8): 0.5 mg via RESPIRATORY_TRACT
  Filled 2015-01-04 (×2): qty 2
  Filled 2015-01-04 (×3): qty 4
  Filled 2015-01-04: qty 2
  Filled 2015-01-04: qty 4
  Filled 2015-01-04: qty 2
  Filled 2015-01-04: qty 4
  Filled 2015-01-04 (×3): qty 2

## 2015-01-04 MED ORDER — DOXYCYCLINE HYCLATE 100 MG PO TABS
100.0000 mg | ORAL_TABLET | Freq: Two times a day (BID) | ORAL | Status: DC
  Administered 2015-01-04 – 2015-01-09 (×11): 100 mg via ORAL
  Filled 2015-01-04 (×11): qty 1

## 2015-01-04 MED ORDER — CETYLPYRIDINIUM CHLORIDE 0.05 % MT LIQD
7.0000 mL | Freq: Two times a day (BID) | OROMUCOSAL | Status: DC
  Administered 2015-01-06 – 2015-01-07 (×3): 7 mL via OROMUCOSAL

## 2015-01-04 NOTE — Progress Notes (Signed)
Patient ID: Steven AVETISYAN Sr., male   DOB: 21-Oct-1955, 59 y.o.   MRN: [id number] [facility] PROGRESS NOTE  PCP: Marden Noble, MD  HPI/Subjective: Patient feeling short of breath. Wheezing. Coughing up yellowish phlegm. States he has not been able to smoke. Even the taste of the cigarette is not appealing anymore.  Objective: Filed Vitals:   01/04/15 0527  BP: 110/71  Pulse: 73  Temp: 98.9 F (37.2 C)  Resp: 18    Intake/Output Summary (Last 24 hours) at 01/04/15 0926 Last data filed at 01/04/15 0756  Gross per 24 hour  Intake      0 ml  Output   1225 ml  Net  -1225 ml   Filed Weights   01/03/15 1411 01/03/15 1859  Weight: 97.977 kg (216 lb) 94.62 kg (208 lb 9.6 oz)    ROS: Review of Systems  Constitutional: Negative for fever and chills.  Eyes: Negative for blurred vision.  Respiratory: Positive for cough, sputum production and shortness of breath.   Cardiovascular: Negative for chest pain.  Gastrointestinal: Negative for nausea, vomiting, abdominal pain, diarrhea and constipation.  Genitourinary: Negative for dysuria.  Musculoskeletal: Negative for joint pain.  Neurological: Negative for dizziness and headaches.   Exam: Physical Exam  HENT:  Nose: No mucosal edema.  Mouth/Throat: No oropharyngeal exudate or posterior oropharyngeal edema.  Eyes: Conjunctivae, EOM and lids are normal. Pupils are equal, round, and reactive to light.  Neck: No JVD present. Carotid bruit is not present. No edema present. No thyroid mass and no thyromegaly present.  Cardiovascular: S1 normal and S2 normal.  Exam reveals no gallop.   No murmur heard. Pulses:      Dorsalis pedis pulses are 2+ on the right side, and 2+ on the left side.  Respiratory: No respiratory distress. He has decreased breath sounds in the right middle field, the right lower field, the left middle field and the left lower field. He has wheezes in the right upper field, the right middle field, the  right lower field, the left upper field, the left middle field and the left lower field. He has no rhonchi. He has no rales.  Upper airway congestion.  GI: Soft. Bowel sounds are normal. There is no tenderness.  Musculoskeletal:       Right ankle: He exhibits no swelling.       Left ankle: He exhibits no swelling.  Lymphadenopathy:    He has no cervical adenopathy.  Neurological: He is alert. No cranial nerve deficit.  Skin: Skin is warm. No rash noted. Nails show no clubbing.  Psychiatric: He has a normal mood and affect.    Data Reviewed: Basic Metabolic Panel:  Recent Labs Lab 01/03/15 1504  NA 137  K 4.8  CL 97*  CO2 33*  GLUCOSE 97  BUN 11  CREATININE 1.02  1.02  CALCIUM 8.5*   Liver Function Tests:  Recent Labs Lab 01/03/15 1504  AST 27  ALT 14*  ALKPHOS 92  BILITOT 0.4  PROT 6.9  ALBUMIN 3.7   CBC:  Recent Labs Lab 01/03/15 1504  WBC 6.2  NEUTROABS 3.9  HGB 13.9  HCT 43.3  MCV 85.8  PLT 154   Cardiac Enzymes:  Recent Labs Lab 01/03/15 1504  TROPONINI <0.03   Studies: Dg Chest 2 View  01/03/2015   CLINICAL DATA:  Shortness of breath.  EXAM: CHEST  2 VIEW  COMPARISON:  September 18, 2014.  FINDINGS: Stable cardiomediastinal silhouette. No pneumothorax  is noted. Stable right apical density is noted most consistent with scarring. No acute pulmonary disease is noted. Bony thorax is intact.  IMPRESSION: Stable chronic findings.  No acute cardiopulmonary abnormality seen.   Electronically Signed   By: Marijo Conception, M.D.   On: 01/03/2015 14:56    Scheduled Meds: . budesonide  0.5 mg Nebulization BID  . doxycycline  100 mg Oral Q12H  . enoxaparin (LOVENOX) injection  40 mg Subcutaneous Q24H  . FLUoxetine  40 mg Oral Daily  . methylPREDNISolone (SOLU-MEDROL) injection  60 mg Intravenous 3 times per day  . OxyCODONE  20 mg Oral Q12H  . pregabalin  25 mg Oral QID  . tiotropium  18 mcg Inhalation Daily  . traZODone  200 mg Oral QHS     Assessment/Plan:  1. Acute on chronic respiratory failure with hypoxia. I saw the patient and he had his oxygen off. Prior to this he was on 3 L with O2 saturations of 94%. The patient does wear oxygen at home will continue oxygen supplementation. 2. COPD exacerbation. Increase Solu-Medrol to 60 mg every 8 hours. Patient is on budesonide nebulizer. I will add doxycycline. There seems to be a component of upper airway congestion. 3. Tobacco abuse smoking cessation counseling done 3 minutes by me. 4. Depression- continue fluoxetine. 5. Neuropathy- continue Lyrica.  Code Status:     Code Status Orders        Start     Ordered   01/03/15 1654  Full code   Continuous     01/03/15 1657    Advance Directive Documentation        Most Recent Value   Type of Advance Directive  Healthcare Power of Attorney, Living will   Pre-existing out of facility DNR order (yellow form or pink MOST form)     "MOST" Form in Place?       Disposition Plan: Home once breathing better.  Time spent: 25 minutes  Loletha Grayer  [facility]
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
## 2015-01-05 ENCOUNTER — Inpatient Hospital Stay: Payer: Medicaid Other

## 2015-01-05 LAB — BASIC METABOLIC PANEL
ANION GAP: 10 (ref 5–15)
BUN: 23 mg/dL — ABNORMAL HIGH (ref 6–20)
CALCIUM: 8.6 mg/dL — AB (ref 8.9–10.3)
CO2: 27 mmol/L (ref 22–32)
Chloride: 96 mmol/L — ABNORMAL LOW (ref 101–111)
Creatinine, Ser: 1.02 mg/dL (ref 0.61–1.24)
GFR calc non Af Amer: >60 mL/min (ref >60)
GLUCOSE: 235 mg/dL — AB (ref 65–99)
Potassium: 5.1 mmol/L (ref 3.5–5.1)
Sodium: 133 mmol/L — ABNORMAL LOW (ref 135–145)

## 2015-01-05 LAB — CBC
HCT: 45.2 % (ref 40.0–52.0)
HEMOGLOBIN: 14.2 g/dL (ref 13.0–18.0)
MCH: 26.9 pg (ref 26.0–34.0)
MCHC: 31.3 g/dL — AB (ref 32.0–36.0)
MCV: 85.9 fL (ref 80.0–100.0)
Platelets: 192 10*3/uL (ref 150–440)
RBC: 5.27 MIL/uL (ref 4.40–5.90)
RDW: 18.2 % — ABNORMAL HIGH (ref 11.5–14.5)
WBC: 13.3 10*3/uL — AB (ref 3.8–10.6)

## 2015-01-05 MED ORDER — BENZONATATE 100 MG PO CAPS
200.0000 mg | ORAL_CAPSULE | Freq: Three times a day (TID) | ORAL | Status: DC | PRN
  Administered 2015-01-05: 200 mg via ORAL
  Filled 2015-01-05: qty 2

## 2015-01-05 MED ORDER — ARFORMOTEROL TARTRATE 15 MCG/2ML IN NEBU
15.0000 ug | INHALATION_SOLUTION | Freq: Two times a day (BID) | RESPIRATORY_TRACT | Status: DC
  Administered 2015-01-05 – 2015-01-09 (×6): 15 ug via RESPIRATORY_TRACT
  Filled 2015-01-05 (×13): qty 2

## 2015-01-05 NOTE — Evaluation (Signed)
Physical Therapy Evaluation Patient Details Name: Steven PAGNOTTA Sr. MRN: [id number] DOB: July 04, 1955 Today's Date: 01/05/2015   History of Present Illness  Steven Wiley is a 59 y.o. male with a known history of COPD on oxygen 2-3 L comes in because of shortness of breath cough and wheezing. Patient says that he is not getting oxygen as prescribed at [facility]. He also reports he is barely getting up to walk there, and he is not getting as much exercise as he thinks he needs and feels he is getting weaker.  He also has chronic back pain and he says that it is getting worse the past 3 weeks because he isnt getting up. Patient main complaint today is shortness of breath , weakness, and  chronic back pain and neck pain.   Clinical Impression  Pt presents as a 59 y/o male which has apparent extremity weakness and reduced endurance. Pt O2 remained good (>90%) during ambulation today x 65ft on 3L O2 before reporting fatigue. Pt does have heavy UE reliance on the RW, which could be contributing to his neck pain. Pt has history of chronic LBP, which he reports has worsened since he has been less active. Pt would benefit from skilled PT services to address strength, gait, endurance, balance to maximize mobility and return pt to PLOF.    Follow Up Recommendations SNF (with physical therapy after DC)    Equipment Recommendations   (pt has a rollator)    Recommendations for Other Services       Precautions / Restrictions Precautions Precaution Comments: moderate fall risk Restrictions Weight Bearing Restrictions: No      Mobility  Bed Mobility Overal bed mobility: Modified Independent             General bed mobility comments: pt performs supine to sit and sit to supine with supervision using bed rails  Transfers Overall transfer level: Modified independent Equipment used: Rolling walker (2 wheeled)             General transfer comment: pt performs sit to stand using RW with  supervision / stand to sit with supervision, standing with heavy reliance on RW needing cues.   Ambulation/Gait Ambulation/Gait assistance: Min guard Ambulation Distance (Feet): 30 Feet Assistive device: Rolling walker (2 wheeled) Gait Pattern/deviations: Step-to pattern;Wide base of support;Decreased step length - left;Decreased step length - right Gait velocity: <0.25m/s   General Gait Details: pt performs step-to pattern of gait, min cues to to keep walker more in front as he was getting too close to device. pt needed short standing rest breaks due to pain/ SOB. O2 sat 91% or above throughout ambulation. CGA for safety.   Stairs            Wheelchair Mobility    Modified Rankin (Stroke Patients Only)       Balance Overall balance assessment:  (moderate, + Rhomberg. )                                           Pertinent Vitals/Pain Pain Assessment: 0-10 Pain Score: 6  Pain Location: neck and back Pain Descriptors / Indicators: Aching Pain Intervention(s): Repositioned (pt advised to do gentle neck ROM exercises in all planes including cervical retraction. used lumbar support pillow )    Home Living Family/patient expects to be discharged to:: Skilled nursing facility  Prior Function Level of Independence: Independent with assistive device(s)         Comments: reports uses a rollator at home and O2     Hand Dominance        Extremity/Trunk Assessment   Upper Extremity Assessment: Generalized weakness           Lower Extremity Assessment: RLE deficits/detail;LLE deficits/detail RLE Deficits / Details: hip flexion 4-/5, knee extension 4-/5, hamstrings 4-/5, hip abd/adduction 4-/5. ankle DF 4/5  LLE Deficits / Details: hip flexion 4-/5, knee extension 4-/5, hamstrings 4-/5, hip abd/adduction 4-/5. ankle DF 4/5   Cervical / Trunk Assessment:  (forward head)  Communication   Communication: No difficulties   Cognition Arousal/Alertness: Awake/alert                          General Comments      Exercises Other Exercises Other Exercises: cervical rotation, flexion, sidebending and retraction x 5 each, AROM.       Assessment/Plan    PT Assessment Patient needs continued PT services  PT Diagnosis Difficulty walking;Generalized weakness (chronic back/neck pain)   PT Problem List Decreased strength;Decreased range of motion;Decreased activity tolerance;Decreased balance;Decreased mobility;Decreased knowledge of use of DME;Pain  PT Treatment Interventions Gait training;Functional mobility training;Therapeutic exercise;Manual techniques;Patient/family education;Neuromuscular re-education;Balance training;Modalities;Therapeutic activities   PT Goals (Current goals can be found in the Care Plan section) Acute Rehab PT Goals Patient Stated Goal: go to SNF and do more exercise so he can get stronger PT Goal Formulation: With patient Time For Goal Achievement: 01/19/15 Potential to Achieve Goals: Good    Frequency Min 2X/week   Barriers to discharge        Co-evaluation               End of Session Equipment Utilized During Treatment: Gait belt;Oxygen Activity Tolerance: Patient limited by fatigue;Patient limited by pain Patient left: in bed;with bed alarm set           Time: 1520-1555 PT Time Calculation (min) (ACUTE ONLY): 35 min   Charges:   PT Evaluation $Initial PT Evaluation Tier I: 1 Procedure     PT G Codes:       Steven Wiley C. Adedamola Seto, PT, DPT [phone]  Steven Wiley 01/05/2015, 5:36 PM

## 2015-01-05 NOTE — Progress Notes (Signed)
Pt complaining of cough.  Pt states he is choking on his phlegm and gets hot.  He would like medication to help with his cough if needed later.  Pt states he would like a CT of his head done tomorrow bc his back pain and cough are causing him to have throbbing in his head.  RN told pt she would relay his request to day shift but will notify MD for cough medication. He states he cannot take robitussin cough syrup. Dr. Jannifer Franklin notified, MD to order tessalon pearles as needed for cough. Will continue to monitor.

## 2015-01-05 NOTE — Progress Notes (Signed)
Pt's papin being managed better. He remains a/o, and not overly sedated. Appetite good. Voiding in urinal. Some wheezing, but not any resp distress.

## 2015-01-05 NOTE — Progress Notes (Signed)
Patient ID: Steven NIEHOFF Sr., male   DOB: [birth year], 59 y.o.   MRN: [id number]  [facility] PROGRESS NOTE  PCP: Marden Noble, MD  HPI/Subjective: Patient feeling short of breath and  wheezing. Coughing up yellowish phlegm. States he has not been able to smoke. Even the taste of the cigarette is not appealing anymore. Patient having postnasal drip and feels like things are sticking in his throat area.  Objective: Filed Vitals:   01/05/15 0541  BP: 117/74  Pulse: 56  Temp: 98 F (36.7 C)  Resp: 18    Intake/Output Summary (Last 24 hours) at 01/05/15 1053 Last data filed at 01/05/15 1000  Gross per 24 hour  Intake      0 ml  Output   2550 ml  Net  -2550 ml   Filed Weights   01/03/15 1411 01/03/15 1859 01/05/15 0541  Weight: 97.977 kg (216 lb) 94.62 kg (208 lb 9.6 oz) 94.121 kg (207 lb 8 oz)    ROS: Review of Systems  Constitutional: Negative for fever and chills.  Eyes: Negative for blurred vision.  Respiratory: Positive for cough, sputum production and shortness of breath.   Cardiovascular: Negative for chest pain.  Gastrointestinal: Negative for nausea, vomiting, abdominal pain, diarrhea and constipation.  Genitourinary: Negative for dysuria.  Musculoskeletal: Negative for joint pain.  Neurological: Negative for dizziness and headaches.   Exam: Physical Exam  HENT:  Nose: No mucosal edema.  Mouth/Throat: No oropharyngeal exudate or posterior oropharyngeal edema.  Eyes: Conjunctivae, EOM and lids are normal. Pupils are equal, round, and reactive to light.  Neck: No JVD present. Carotid bruit is not present. No edema present. No thyroid mass and no thyromegaly present.  Cardiovascular: S1 normal and S2 normal.  Exam reveals no gallop.   No murmur heard. Pulses:      Dorsalis pedis pulses are 2+ on the right side, and 2+ on the left side.  Respiratory: No respiratory distress. He has decreased breath sounds in the right middle field, the right lower  field, the left middle field and the left lower field. He has wheezes in the right upper field, the right middle field, the right lower field, the left upper field, the left middle field and the left lower field. He has no rhonchi. He has no rales.  Upper airway congestion. After coughing the wheeze cleared up a little bit.  GI: Soft. Bowel sounds are normal. There is no tenderness.  Musculoskeletal:       Right ankle: He exhibits no swelling.       Left ankle: He exhibits no swelling.  Lymphadenopathy:    He has no cervical adenopathy.  Neurological: He is alert. No cranial nerve deficit.  Skin: Skin is warm. No rash noted. Nails show no clubbing.  Psychiatric: He has a normal mood and affect.    Data Reviewed: Basic Metabolic Panel:  Recent Labs Lab 01/03/15 1504 01/05/15 0034  NA 137 133*  K 4.8 5.1  CL 97* 96*  CO2 33* 27  GLUCOSE 97 235*  BUN 11 23*  CREATININE 1.02  1.02 1.02  CALCIUM 8.5* 8.6*   Liver Function Tests:  Recent Labs Lab 01/03/15 1504  AST 27  ALT 14*  ALKPHOS 92  BILITOT 0.4  PROT 6.9  ALBUMIN 3.7   CBC:  Recent Labs Lab 01/03/15 1504 01/05/15 0034  WBC 6.2 13.3*  NEUTROABS 3.9  --   HGB 13.9 14.2  HCT 43.3 45.2  MCV 85.8  85.9  PLT 154 192   Cardiac Enzymes:  Recent Labs Lab 01/03/15 1504  TROPONINI <0.03   Studies: Dg Chest 2 View  01/03/2015   CLINICAL DATA:  Shortness of breath.  EXAM: CHEST  2 VIEW  COMPARISON:  September 18, 2014.  FINDINGS: Stable cardiomediastinal silhouette. No pneumothorax is noted. Stable right apical density is noted most consistent with scarring. No acute pulmonary disease is noted. Bony thorax is intact.  IMPRESSION: Stable chronic findings.  No acute cardiopulmonary abnormality seen.   Electronically Signed   By: Marijo Conception, M.D.   On: 01/03/2015 14:56    Scheduled Meds: . antiseptic oral rinse  7 mL Mouth Rinse q12n4p  . budesonide  0.5 mg Nebulization BID  . chlorhexidine  15 mL Mouth Rinse BID   . doxycycline  100 mg Oral Q12H  . enoxaparin (LOVENOX) injection  40 mg Subcutaneous Q24H  . FLUoxetine  40 mg Oral Daily  . methylPREDNISolone (SOLU-MEDROL) injection  60 mg Intravenous 3 times per day  . OxyCODONE  20 mg Oral Q12H  . pregabalin  25 mg Oral QID  . tiotropium  18 mcg Inhalation Daily  . traZODone  200 mg Oral QHS    Assessment/Plan:  1. Acute on chronic respiratory failure with hypoxia. Patient is breathing comfortably on room air. Bronchospasm when taking a deep breath inducing coughing. I will get a lateral neck film because patient is concerned about phlegm getting caught around the throat area. No tripoding or difficulty with swallowing or drooling. I doubt epiglottitis. 2. COPD exacerbation. Solu-Medrol to 60 mg every 8 hours. Patient is on budesonide nebulizer. Continue doxycycline. There seems to be a component of upper airway congestion. Will add Flonase nasal spray. 3. Tobacco abuse smoking cessation counseling done 3 minutes by me. 4. Depression- continue fluoxetine. 5. Neuropathy- continue Lyrica. 6. Chronic pain syndrome on medication.  Code Status:     Code Status Orders        Start     Ordered   01/03/15 1654  Full code   Continuous     01/03/15 1657    Advance Directive Documentation        Most Recent Value   Type of Advance Directive  Healthcare Power of Attorney, Living will   Pre-existing out of facility DNR order (yellow form or pink MOST form)     "MOST" Form in Place?       Disposition Plan: Home once breathing better. Potentially may take another couple days.  Time spent: 25 minutes  Loletha Grayer  [facility] Hospitalists

## 2015-01-05 NOTE — Progress Notes (Signed)
Pt refusing to let staff assist him to the rest room. Reports he will call when he is finished so we can help him back to bed and turn alarm back on.  Will continue to monitor.

## 2015-01-06 LAB — GLUCOSE, CAPILLARY
Glucose-Capillary: 146 mg/dL — ABNORMAL HIGH (ref 65–99)
Glucose-Capillary: 126 mg/dL — ABNORMAL HIGH (ref 65–99)
Glucose-Capillary: 195 mg/dL — ABNORMAL HIGH (ref 65–99)
Glucose-Capillary: 208 mg/dL — ABNORMAL HIGH (ref 65–99)

## 2015-01-06 LAB — BASIC METABOLIC PANEL
Anion gap: 7 (ref 5–15)
BUN: 22 mg/dL — ABNORMAL HIGH (ref 6–20)
CALCIUM: 9.1 mg/dL (ref 8.9–10.3)
CHLORIDE: 101 mmol/L (ref 101–111)
CO2: 30 mmol/L (ref 22–32)
CREATININE: 1 mg/dL (ref 0.61–1.24)
GFR calc Af Amer: >60 mL/min (ref >60)
GFR calc non Af Amer: >60 mL/min (ref >60)
GLUCOSE: 259 mg/dL — AB (ref 65–99)
POTASSIUM: 4.4 mmol/L (ref 3.5–5.1)
SODIUM: 138 mmol/L (ref 135–145)

## 2015-01-06 MED ORDER — INSULIN ASPART 100 UNIT/ML ~~LOC~~ SOLN
0.0000 [IU] | Freq: Every day | SUBCUTANEOUS | Status: DC
  Administered 2015-01-06: 2 [IU] via SUBCUTANEOUS
  Filled 2015-01-06: qty 2

## 2015-01-06 MED ORDER — ONDANSETRON HCL 4 MG/2ML IJ SOLN
4.0000 mg | Freq: Four times a day (QID) | INTRAMUSCULAR | Status: DC | PRN
  Administered 2015-01-06 – 2015-01-07 (×3): 4 mg via INTRAVENOUS
  Filled 2015-01-06 (×3): qty 2

## 2015-01-06 MED ORDER — FAMOTIDINE 20 MG PO TABS
20.0000 mg | ORAL_TABLET | Freq: Two times a day (BID) | ORAL | Status: DC
  Administered 2015-01-06 – 2015-01-09 (×7): 20 mg via ORAL
  Filled 2015-01-06 (×7): qty 1

## 2015-01-06 MED ORDER — POLYVINYL ALCOHOL 1.4 % OP SOLN
1.0000 [drp] | Freq: Three times a day (TID) | OPHTHALMIC | Status: DC | PRN
  Administered 2015-01-06 – 2015-01-09 (×2): 1 [drp] via OPHTHALMIC
  Filled 2015-01-06: qty 15

## 2015-01-06 MED ORDER — INSULIN ASPART 100 UNIT/ML ~~LOC~~ SOLN
0.0000 [IU] | Freq: Three times a day (TID) | SUBCUTANEOUS | Status: DC
  Administered 2015-01-06: 3 [IU] via SUBCUTANEOUS
  Administered 2015-01-06 (×2): 2 [IU] via SUBCUTANEOUS
  Administered 2015-01-07: 8 [IU] via SUBCUTANEOUS
  Administered 2015-01-07: 5 [IU] via SUBCUTANEOUS
  Administered 2015-01-07 – 2015-01-08 (×3): 3 [IU] via SUBCUTANEOUS
  Administered 2015-01-08 – 2015-01-09 (×2): 5 [IU] via SUBCUTANEOUS
  Administered 2015-01-09: 3 [IU] via SUBCUTANEOUS
  Filled 2015-01-06 (×4): qty 3
  Filled 2015-01-06 (×2): qty 5
  Filled 2015-01-06 (×3): qty 3
  Filled 2015-01-06: qty 2
  Filled 2015-01-06: qty 8
  Filled 2015-01-06: qty 2
  Filled 2015-01-06: qty 5

## 2015-01-06 NOTE — Progress Notes (Signed)
Notified MD that patient would like protonix, zofran, and trazadone. Okay to order pepcid 20 mg bid po, zofran q6h prn IV, and trazadone to be used qhs which is already ordered. Verbal order in person, Dr. Leslye Peer.

## 2015-01-06 NOTE — Progress Notes (Signed)
Patient ID: Steven MEDINGER Sr., male   DOB: 11/26/1955, 59 y.o.   MRN: [id number]  [facility] Physicians PROGRESS NOTE  PCP: Marden Noble, MD  HPI/Subjective: Patient had a rough night. He didn't ask for his trazodone and toes [place] aiding give it to him so he didn't sleep well. Still short of breath. Coughing but can't get anything up. Still wheezing quite a bit. He is complaining of neck pain.   Objective: Filed Vitals:   01/06/15 1110  BP: 128/80  Pulse: 60  Temp: 98.5 F (36.9 C)  Resp: 21    Intake/Output Summary (Last 24 hours) at 01/06/15 1243 Last data filed at 01/06/15 1111  Gross per 24 hour  Intake      0 ml  Output   1125 ml  Net  -1125 ml   Filed Weights   01/03/15 1411 01/03/15 1859 01/05/15 0541  Weight: 97.977 kg (216 lb) 94.62 kg (208 lb 9.6 oz) 94.121 kg (207 lb 8 oz)    ROS: Review of Systems  Constitutional: Negative for fever and chills.  Eyes: Negative for blurred vision.  Respiratory: Positive for cough, sputum production and shortness of breath.   Cardiovascular: Negative for chest pain.  Gastrointestinal: Negative for nausea, vomiting, abdominal pain, diarrhea and constipation.  Genitourinary: Negative for dysuria.  Musculoskeletal: Positive for neck pain. Negative for joint pain.  Neurological: Positive for headaches. Negative for dizziness.   Exam: Physical Exam  HENT:  Nose: No mucosal edema.  Mouth/Throat: No oropharyngeal exudate or posterior oropharyngeal edema.  Eyes: Conjunctivae, EOM and lids are normal. Pupils are equal, round, and reactive to light.  Neck: No JVD present. Carotid bruit is not present. No edema present. No thyroid mass and no thyromegaly present.  Cardiovascular: S1 normal and S2 normal.  Exam reveals no gallop.   No murmur heard. Pulses:      Dorsalis pedis pulses are 2+ on the right side, and 2+ on the left side.  Respiratory: No respiratory distress. He has decreased breath sounds in the right middle  field, the right lower field, the left middle field and the left lower field. He has wheezes in the right upper field, the right middle field, the right lower field, the left upper field, the left middle field and the left lower field. He has no rhonchi. He has no rales.  GI: Soft. Bowel sounds are normal. There is no tenderness.  Musculoskeletal:       Right ankle: He exhibits no swelling.       Left ankle: He exhibits no swelling.  Lymphadenopathy:    He has no cervical adenopathy.  Neurological: He is alert. No cranial nerve deficit.  Skin: Skin is warm. No rash noted. Nails show no clubbing.  Psychiatric: He has a normal mood and affect.    Data Reviewed: Basic Metabolic Panel:  Recent Labs Lab 01/03/15 1504 01/05/15 0034 01/06/15 0434  NA 137 133* 138  K 4.8 5.1 4.4  CL 97* 96* 101  CO2 33* 27 30  GLUCOSE 97 235* 259*  BUN 11 23* 22*  CREATININE 1.02  1.02 1.02 1.00  CALCIUM 8.5* 8.6* 9.1   Liver Function Tests:  Recent Labs Lab 01/03/15 1504  AST 27  ALT 14*  ALKPHOS 92  BILITOT 0.4  PROT 6.9  ALBUMIN 3.7   CBC:  Recent Labs Lab 01/03/15 1504 01/05/15 0034  WBC 6.2 13.3*  NEUTROABS 3.9  --   HGB 13.9 14.2  HCT 43.3  45.2  MCV 85.8 85.9  PLT 154 192   Cardiac Enzymes:  Recent Labs Lab 01/03/15 1504  TROPONINI <0.03   Studies: Dg Neck Soft Tissue  01/05/2015   CLINICAL DATA:  Upper airway congestion  EXAM: NECK SOFT TISSUES - 1+ VIEW  COMPARISON:  None.  FINDINGS: Lateral view obtained. The epiglottis and aryepiglottic folds appear normal. Prevertebral soft tissues and predental space regions are normal. Tongue base region appears clear. The tonsils and adenoidal structures do not appear enlarged. There is degenerative change in the cervical spine.  IMPRESSION: Soft tissues appear normal on lateral view. No inflammatory focus or obstruction seen.   Electronically Signed   By: Lowella Grip III M.D.   On: 01/05/2015 12:14    Scheduled Meds: .  antiseptic oral rinse  7 mL Mouth Rinse q12n4p  . arformoterol  15 mcg Nebulization Q12H  . budesonide  0.5 mg Nebulization BID  . chlorhexidine  15 mL Mouth Rinse BID  . doxycycline  100 mg Oral Q12H  . enoxaparin (LOVENOX) injection  40 mg Subcutaneous Q24H  . famotidine  20 mg Oral BID  . FLUoxetine  40 mg Oral Daily  . insulin aspart  0-15 Units Subcutaneous TID WC  . insulin aspart  0-5 Units Subcutaneous QHS  . methylPREDNISolone (SOLU-MEDROL) injection  60 mg Intravenous 3 times per day  . OxyCODONE  20 mg Oral Q12H  . pregabalin  25 mg Oral QID  . tiotropium  18 mcg Inhalation Daily  . traZODone  200 mg Oral QHS    Assessment/Plan:  1. Acute on chronic respiratory failure with hypoxia. Bronchospasm when taking a deep breath inducing coughing. Continue oxygen supplementation. 2. COPD exacerbation. Solu-Medrol to 60 mg every 8 hours. Patient is on budesonide nebulizer. Continue doxycycline. There seems to be a component of upper airway congestion. Flonase nasal spray. 3. Tobacco abuse smoking cessation counseling done 3 minutes by me. 4. Depression- continue fluoxetine. 5. Neuropathy- continue Lyrica. 6. Chronic pain syndrome on medication.  Code Status:     Code Status Orders        Start     Ordered   01/03/15 1654  Full code   Continuous     01/03/15 1657    Advance Directive Documentation        Most Recent Value   Type of Advance Directive  Healthcare Power of Attorney, Living will   Pre-existing out of facility DNR order (yellow form or pink MOST form)     "MOST" Form in Place?       Disposition Plan: Home once breathing better. Potentially may take another couple days.  Time spent: 22 minutes  Loletha Grayer  [facility] Hospitalists

## 2015-01-06 NOTE — Progress Notes (Signed)
Notified Dr. Fritzi Mandes that patient reports eyes are dry when receiving solumedrol, okay per MD to order liqui eye drops tid prn for dry eyes. Telephone orders.

## 2015-01-06 NOTE — Plan of Care (Signed)
Problem: Phase I Progression Outcomes Goal: Other Phase II Outcomes/Goals Outcome: Progressing Pt is alert and oriented x 4, has chronic back pain and receives prn and schedulded narcotic, up to bathroom with stand by assist, on 2 L oxygen, vital signs stable, afebrile thorughout shift, wbc count increased, receiving po doxycycline and IV steroids , bm throughtout shift.

## 2015-01-06 NOTE — Clinical Social Work Note (Signed)
CSW spoke to [facility] and they will not be able to take pt back at their facility.  His care needs are not appropriate for ALF at this time.  CSW notified pt and he is willing to DC to a SNF outside of [facility].  Placement can only be made out side of [place] due to [employer].  CSW will f/u with facilities once offers have been received.

## 2015-01-06 NOTE — Progress Notes (Signed)
RN brought patient his pm dose of trazadone at scheduled medication pass around 9pm.  Pt told nurse he did not want to take it at that time and would call when he was ready.  Pt never called staff to get his trazadone. Pt did not call for any PRN medications after 2200.  At morning medication pass patient asked if he was going to get his trazadone this morning.  RN told him that medication is due at bedtime to help with sleep.  He says he takes that medication twice daily, PTA medication list states he takes at bedtime.  Will relay to day shift.

## 2015-01-07 LAB — GLUCOSE, CAPILLARY
GLUCOSE-CAPILLARY: 161 mg/dL — AB (ref 65–99)
Glucose-Capillary: 229 mg/dL — ABNORMAL HIGH (ref 65–99)
Glucose-Capillary: 266 mg/dL — ABNORMAL HIGH (ref 65–99)
Glucose-Capillary: 156 mg/dL — ABNORMAL HIGH (ref 65–99)

## 2015-01-07 NOTE — Progress Notes (Signed)
Physical Therapy Treatment Patient Details Name: Steven VIRANI Sr. MRN: [id number] DOB: 08-13-1955 Today's Date: 01/07/2015    History of Present Illness Steven Wiley is a 59 y.o. male with a known history of COPD on oxygen 2-3 L comes in because of shortness of breath cough and wheezing. Patient says that he is not getting oxygen as prescribed at [facility]. He also reports he is barely getting up to walk there, and he is not getting as much exercise as he thinks he needs and feels he is getting weaker.  He also has chronic back pain and he says that it is getting worse the past 3 weeks because he isnt getting up. Patient main complaint today is shortness of breath , weakness, and  chronic back pain and neck pain.     PT Comments    Pt ambulated 30 feet with RW CGA (limited d/t pt coughing up phlegm and needing to spit it out and then pt SOB upon return to bed; O2 89% on 2 L/min via nasal cannula with activity but returned to >93% with rest and vc's for purse lip breathing technique).  Activity limited d/t SOB with activity.  Follow Up Recommendations  SNF     Equipment Recommendations       Recommendations for Other Services       Precautions / Restrictions Precautions Precautions: Fall Precaution Comments: moderate fall risk Restrictions Weight Bearing Restrictions: No    Mobility  Bed Mobility Overal bed mobility: Modified Independent             General bed mobility comments: Supine to sit with HOB elevated mod I  Transfers Overall transfer level: Needs assistance Equipment used: Rolling walker (2 wheeled) Transfers: Sit to/from [facility] Sit to Stand: Supervision Stand pivot transfers: Min guard       General transfer comment: vc's for safety with transfers required  Ambulation/Gait Ambulation/Gait assistance: Min guard Ambulation Distance (Feet): 30 Feet Assistive device: Rolling walker (2 wheeled) Gait Pattern/deviations:  Step-through pattern;Wide base of support;Decreased step length - left;Decreased stance time - right     General Gait Details: pt requiring vc's for safety with gait (pt ambulating quickly back to bed d/t coughing up phlegm and wanting to spit it out)   Science writer    Modified Rankin (Stroke Patients Only)       Balance                                    Cognition Arousal/Alertness: Awake/alert Behavior During Therapy: WFL for tasks assessed/performed Overall Cognitive Status: Within Functional Limits for tasks assessed                      Exercises   Performed semi-supine B LE therapeutic exercise x 10 reps:  Ankle pumps (AROM B LE's); quad sets x3 second holds (AROM B LE's); glute squeezes x3 second holds (AROM B); hip aDduction isometrics (pillow between pt's knees) x3 second holds; SAQ's (AROM R; AROM L); heelslides (AROM R; AROM L), hip abd/adduction (AROM R; AROM L).  Pt required vc's and tactile cues for correct technique with exercises.     General Comments   Nursing cleared pt for participation in physical therapy.  Pt agreeable to PT session if he was given pain medication for his abdominal pain (nursing notified).  Pertinent Vitals/Pain Pain Assessment: 0-10 Pain Score: 7  Pain Location: abdominal Pain Descriptors / Indicators: Aching;Tender;Sharp;Shooting Pain Intervention(s): Limited activity within patient's tolerance;Monitored during session;Patient requesting pain meds-RN notified;RN gave pain meds during session;Repositioned  HR 58-74 bpm during session.    Home Living                      Prior Function            PT Goals (current goals can now be found in the care plan section) Acute Rehab PT Goals Patient Stated Goal: To improve breathing PT Goal Formulation: With patient Time For Goal Achievement: 01/19/15 Potential to Achieve Goals: Good Progress towards PT goals:  Progressing toward goals    Frequency  Min 2X/week    PT Plan Current plan remains appropriate    Co-evaluation             End of Session Equipment Utilized During Treatment: Gait belt;Oxygen Activity Tolerance: Patient limited by fatigue;Patient limited by pain (Pt c/o SOB with activity) Patient left:  (sitting on edge of bed with nursing aide present to assist pt to bathroom for toileting)     Time: 4270-6237 PT Time Calculation (min) (ACUTE ONLY): 24 min  Charges:  $Therapeutic Exercise: 23-37 mins                    G CodesLeitha Bleak 01-24-2015, 12:18 PM Leitha Bleak, [place]

## 2015-01-07 NOTE — Progress Notes (Addendum)
Patient ID: YEUDIEL MATEO Sr., male   DOB: Oct 20, 1955, 59 y.o.   MRN: [id number]  [facility] PROGRESS NOTE  PCP: Marden Noble, MD  HPI/Subjective: Patient feeling some abdominal soreness. Feels his breathing is a little bit better but still wheezing quite a bit. He coughed up a good amount of phlegm yesterday.   Objective: Filed Vitals:   01/07/15 0829  BP: 133/73  Pulse: 58  Temp: 97.5 F (36.4 C)  Resp: 18    Intake/Output Summary (Last 24 hours) at 01/07/15 1024 Last data filed at 01/07/15 0805  Gross per 24 hour  Intake      0 ml  Output    950 ml  Net   -950 ml   Filed Weights   01/03/15 1411 01/03/15 1859 01/05/15 0541  Weight: 97.977 kg (216 lb) 94.62 kg (208 lb 9.6 oz) 94.121 kg (207 lb 8 oz)    ROS: Review of Systems  Constitutional: Negative for fever and chills.  Eyes: Negative for blurred vision.  Respiratory: Positive for cough, sputum production and shortness of breath.   Cardiovascular: Negative for chest pain.  Gastrointestinal: Positive for abdominal pain. Negative for nausea, vomiting, diarrhea and constipation.  Genitourinary: Negative for dysuria.  Musculoskeletal: Positive for neck pain. Negative for joint pain.  Neurological: Positive for headaches. Negative for dizziness.   Exam: Physical Exam  HENT:  Nose: No mucosal edema.  Mouth/Throat: No oropharyngeal exudate or posterior oropharyngeal edema.  Eyes: Conjunctivae, EOM and lids are normal. Pupils are equal, round, and reactive to light.  Neck: No JVD present. Carotid bruit is not present. No edema present. No thyroid mass and no thyromegaly present.  Cardiovascular: S1 normal and S2 normal.  Exam reveals no gallop.   No murmur heard. Pulses:      Dorsalis pedis pulses are 2+ on the right side, and 2+ on the left side.  Respiratory: No respiratory distress. He has decreased breath sounds in the right lower field and the left lower field. He has wheezes in the right upper  field, the right middle field, the right lower field, the left upper field, the left middle field and the left lower field. He has no rhonchi. He has no rales.  Better air entry today than yesterday.  GI: Soft. Bowel sounds are normal. There is no tenderness.  Musculoskeletal:       Right ankle: He exhibits no swelling.       Left ankle: He exhibits no swelling.  Lymphadenopathy:    He has no cervical adenopathy.  Neurological: He is alert. No cranial nerve deficit.  Skin: Skin is warm. No rash noted. Nails show no clubbing.  Psychiatric: He has a normal mood and affect.    Data Reviewed: Basic Metabolic Panel:  Recent Labs Lab 01/03/15 1504 01/05/15 0034 01/06/15 0434  NA 137 133* 138  K 4.8 5.1 4.4  CL 97* 96* 101  CO2 33* 27 30  GLUCOSE 97 235* 259*  BUN 11 23* 22*  CREATININE 1.02  1.02 1.02 1.00  CALCIUM 8.5* 8.6* 9.1   Liver Function Tests:  Recent Labs Lab 01/03/15 1504  AST 27  ALT 14*  ALKPHOS 92  BILITOT 0.4  PROT 6.9  ALBUMIN 3.7   CBC:  Recent Labs Lab 01/03/15 1504 01/05/15 0034  WBC 6.2 13.3*  NEUTROABS 3.9  --   HGB 13.9 14.2  HCT 43.3 45.2  MCV 85.8 85.9  PLT 154 192   Cardiac Enzymes:  Recent Labs Lab 01/03/15 1504  TROPONINI <0.03   Studies: Dg Neck Soft Tissue  01/05/2015   CLINICAL DATA:  Upper airway congestion  EXAM: NECK SOFT TISSUES - 1+ VIEW  COMPARISON:  None.  FINDINGS: Lateral view obtained. The epiglottis and aryepiglottic folds appear normal. Prevertebral soft tissues and predental space regions are normal. Tongue base region appears clear. The tonsils and adenoidal structures do not appear enlarged. There is degenerative change in the cervical spine.  IMPRESSION: Soft tissues appear normal on lateral view. No inflammatory focus or obstruction seen.   Electronically Signed   By: Lowella Grip III M.D.   On: 01/05/2015 12:14    Scheduled Meds: . antiseptic oral rinse  7 mL Mouth Rinse q12n4p  . arformoterol  15 mcg  Nebulization Q12H  . budesonide  0.5 mg Nebulization BID  . chlorhexidine  15 mL Mouth Rinse BID  . doxycycline  100 mg Oral Q12H  . enoxaparin (LOVENOX) injection  40 mg Subcutaneous Q24H  . famotidine  20 mg Oral BID  . FLUoxetine  40 mg Oral Daily  . insulin aspart  0-15 Units Subcutaneous TID WC  . insulin aspart  0-5 Units Subcutaneous QHS  . methylPREDNISolone (SOLU-MEDROL) injection  60 mg Intravenous 3 times per day  . OxyCODONE  20 mg Oral Q12H  . pregabalin  25 mg Oral QID  . tiotropium  18 mcg Inhalation Daily  . traZODone  200 mg Oral QHS    Assessment/Plan:  1. Acute on chronic respiratory failure with hypoxia. Bronchospasm when taking a deep breath inducing coughing. Continue oxygen supplementation. 2. COPD exacerbation. Solu-Medrol to 60 mg every 8 hours. Patient is on budesonide nebulizer. Continue doxycycline. There seems to be a component of upper airway congestion.  3. Tobacco abuse smoking cessation counseling done 3 minutes by me. 4. Depression- continue fluoxetine. 5. Neuropathy- continue Lyrica. 6. Chronic pain syndrome on medication. 7. Steroid-induced hyperglycemia- I put on sliding scale. 8. Unfortunately the place that he is living at will no longer accept him back. Social worker looking into a rehabilitation facilities at this point.  Code Status:     Code Status Orders        Start     Ordered   01/03/15 1654  Full code   Continuous     01/03/15 1657    Advance Directive Documentation        Most Recent Value   Type of Advance Directive  Healthcare Power of Attorney, Living will   Pre-existing out of facility DNR order (yellow form or pink MOST form)     "MOST" Form in Place?       Disposition Plan: Home once breathing better. Potentially may take another couple days.  Time spent: 22 minutes  Loletha Grayer  [facility]

## 2015-01-08 LAB — GLUCOSE, CAPILLARY
GLUCOSE-CAPILLARY: 213 mg/dL — AB (ref 65–99)
GLUCOSE-CAPILLARY: 157 mg/dL — AB (ref 65–99)
GLUCOSE-CAPILLARY: 160 mg/dL — AB (ref 65–99)
Glucose-Capillary: 174 mg/dL — ABNORMAL HIGH (ref 65–99)
Glucose-Capillary: 159 mg/dL — ABNORMAL HIGH (ref 65–99)

## 2015-01-08 MED ORDER — METHYLPREDNISOLONE SODIUM SUCC 40 MG IJ SOLR
40.0000 mg | Freq: Three times a day (TID) | INTRAMUSCULAR | Status: DC
  Administered 2015-01-08 – 2015-01-09 (×4): 40 mg via INTRAVENOUS
  Filled 2015-01-08 (×4): qty 1

## 2015-01-08 NOTE — Progress Notes (Signed)
Patient ID: Steven JAKUBIAK Sr., male   DOB: 08/09/1955, 59 y.o.   MRN: [id number]   [facility] Phys[facility] PROGRESS NOTE  PCP: Marden Noble, MD  HPI/Subjective: Patient starting to feel a bit better today. He is coughing up some phlegm and felt a little clearer in the chest. Feels his breathing is not back to his usual breathing yet. Still coughing and wheezing.   Objective: Filed Vitals:   01/08/15 1136  BP: 142/88  Pulse: 74  Temp: 97.6 F (36.4 C)  Resp:     Intake/Output Summary (Last 24 hours) at 01/08/15 1251 Last data filed at 01/08/15 1050  Gross per 24 hour  Intake      0 ml  Output   1250 ml  Net  -1250 ml   Filed Weights   01/03/15 1411 01/03/15 1859 01/05/15 0541  Weight: 97.977 kg (216 lb) 94.62 kg (208 lb 9.6 oz) 94.121 kg (207 lb 8 oz)    ROS: Review of Systems  Constitutional: Negative for fever and chills.  Eyes: Negative for blurred vision.  Respiratory: Positive for cough, sputum production and shortness of breath.   Cardiovascular: Negative for chest pain.  Gastrointestinal: Positive for abdominal pain. Negative for nausea, vomiting, diarrhea and constipation.  Genitourinary: Negative for dysuria.  Musculoskeletal: Positive for neck pain. Negative for joint pain.  Neurological: Positive for headaches. Negative for dizziness.   Exam: Physical Exam  HENT:  Nose: No mucosal edema.  Mouth/Throat: No oropharyngeal exudate or posterior oropharyngeal edema.  Eyes: Conjunctivae, EOM and lids are normal. Pupils are equal, round, and reactive to light.  Neck: No JVD present. Carotid bruit is not present. No edema present. No thyroid mass and no thyromegaly present.  Cardiovascular: S1 normal and S2 normal.  Exam reveals no gallop.   No murmur heard. Pulses:      Dorsalis pedis pulses are 2+ on the right side, and 2+ on the left side.  Respiratory: No respiratory distress. He has decreased breath sounds in the right lower field and the left lower  field. He has wheezes in the right middle field, the right lower field, the left middle field and the left lower field. He has no rhonchi. He has no rales.  Better air entry today and less wheeze today.  GI: Soft. Bowel sounds are normal. There is no tenderness.  Musculoskeletal:       Right ankle: He exhibits no swelling.       Left ankle: He exhibits no swelling.  Lymphadenopathy:    He has no cervical adenopathy.  Neurological: He is alert. No cranial nerve deficit.  Skin: Skin is warm. No rash noted. Nails show no clubbing.  Psychiatric: He has a normal mood and affect.    Data Reviewed: Basic Metabolic Panel:  Recent Labs Lab 01/03/15 1504 01/05/15 0034 01/06/15 0434  NA 137 133* 138  K 4.8 5.1 4.4  CL 97* 96* 101  CO2 33* 27 30  GLUCOSE 97 235* 259*  BUN 11 23* 22*  CREATININE 1.02  1.02 1.02 1.00  CALCIUM 8.5* 8.6* 9.1   Liver Function Tests:  Recent Labs Lab 01/03/15 1504  AST 27  ALT 14*  ALKPHOS 92  BILITOT 0.4  PROT 6.9  ALBUMIN 3.7   CBC:  Recent Labs Lab 01/03/15 1504 01/05/15 0034  WBC 6.2 13.3*  NEUTROABS 3.9  --   HGB 13.9 14.2  HCT 43.3 45.2  MCV 85.8 85.9  PLT 154 192   Cardiac  Enzymes:  Recent Labs Lab 01/03/15 1504  TROPONINI <0.03   Studies: No results found.  Scheduled Meds: . antiseptic oral rinse  7 mL Mouth Rinse q12n4p  . arformoterol  15 mcg Nebulization Q12H  . budesonide  0.5 mg Nebulization BID  . chlorhexidine  15 mL Mouth Rinse BID  . doxycycline  100 mg Oral Q12H  . enoxaparin (LOVENOX) injection  40 mg Subcutaneous Q24H  . famotidine  20 mg Oral BID  . FLUoxetine  40 mg Oral Daily  . insulin aspart  0-15 Units Subcutaneous TID WC  . insulin aspart  0-5 Units Subcutaneous QHS  . methylPREDNISolone (SOLU-MEDROL) injection  40 mg Intravenous 3 times per day  . OxyCODONE  20 mg Oral Q12H  . pregabalin  25 mg Oral QID  . tiotropium  18 mcg Inhalation Daily  . traZODone  200 mg Oral QHS     Assessment/Plan:  1. Acute on chronic respiratory failure with hypoxia. Continue oxygen supplementation. 2. COPD exacerbation. Decrease Solu-Medrol to 40 mg every 8 hours. Patient is on budesonide and Brovana nebulizer. Continue doxycycline. There seems to be a component of upper airway congestion.  3. Tobacco abuse- patient must stop smoking. 4. Depression- continue fluoxetine. 5. Neuropathy- continue Lyrica. 6. Chronic pain syndrome on medication. 7. Steroid-induced hyperglycemia- I put on sliding scale. 8. Unfortunately the place that he is living at will no longer accept him back. Social worker has found the potential place for the patient.   Code Status:     Code Status Orders        Start     Ordered   01/03/15 1654  Full code   Continuous     01/03/15 1657    Advance Directive Documentation        Most Recent Value   Type of Advance Directive  Healthcare Power of Attorney, Living will   Pre-existing out of facility DNR order (yellow form or pink MOST form)     "MOST" Form in Place?       Disposition Plan: Potential out to new facility tomorrow  Time spent: 20 minutes  Loletha Grayer  [facility] Hospitalists

## 2015-01-08 NOTE — Progress Notes (Signed)
Physical Therapy Treatment Patient Details Name: Steven SANTUCCI Sr. MRN: [id number] DOB: 01-12-1956 Today's Date: 01/08/2015    History of Present Illness Erik Burkett is a 59 y.o. male with a known history of COPD on oxygen 2-3 L comes in because of shortness of breath cough and wheezing. Patient says that he is not getting oxygen as prescribed at [facility]. He also reports he is barely getting up to walk there, and he is not getting as much exercise as he thinks he needs and feels he is getting weaker.  He also has chronic back pain and he says that it is getting worse the past 3 weeks because he isnt getting up. Patient main complaint today is shortness of breath , weakness, and  chronic back pain and neck pain.     PT Comments    Pt able to improve ambulation distance today but limited d/t SOB and fatigue.  Pt's O2 sats on room air at rest 93% and with ambulation decreased to 89% on room air (returned to >92% on room air post ambulation with rest).  Pt demonstrates impaired activity tolerance and required multiple standing rest breaks with ambulation d/t fatigue and SOB.  Pt would benefit from continued therapy to improve ability to safely manage functional mobility required for safe discharge.  Follow Up Recommendations  SNF     Equipment Recommendations       Recommendations for Other Services       Precautions / Restrictions Precautions Precautions: Fall Restrictions Weight Bearing Restrictions: No    Mobility  Bed Mobility               General bed mobility comments: Pt sitting up in chair upon arrival and sat in chair end of session so not assessed.  Transfers Overall transfer level: Needs assistance Equipment used: Rolling walker (2 wheeled) Transfers: Sit to/from [facility] Sit to Stand:  (Performed sit to/from stand x11 trials with RW (for strengthening and to improve activity tolerance)) Stand pivot transfers: Min guard        General transfer comment: vc's for safety with transfers required  Ambulation/Gait Ambulation/Gait assistance: Min guard Ambulation Distance (Feet): 50 Feet Assistive device: Rolling walker (2 wheeled)   Gait velocity: decreased   General Gait Details: decreased cadence; multiple standing rest breaks; decreased B step length/foot clearance   Stairs            Wheelchair Mobility    Modified Rankin (Stroke Patients Only)       Balance                                    Cognition Arousal/Alertness: Awake/alert Behavior During Therapy: WFL for tasks assessed/performed Overall Cognitive Status: Within Functional Limits for tasks assessed                      Exercises      General Comments   Nursing cleared pt for participation in physical therapy.  Pt agreeable to PT session.       Pertinent Vitals/Pain Pain Assessment: 0-10 Pain Score: 3  Pain Location: low back Pain Descriptors / Indicators: Aching;Sore Pain Intervention(s): Limited activity within patient's tolerance;Monitored during session  HR WFL during session.    Home Living                      Prior Function  PT Goals (current goals can now be found in the care plan section) Acute Rehab PT Goals Patient Stated Goal: To improve breathing PT Goal Formulation: With patient Time For Goal Achievement: 01/19/15 Potential to Achieve Goals: Good Progress towards PT goals: Progressing toward goals    Frequency  Min 2X/week    PT Plan Current plan remains appropriate    Co-evaluation             End of Session Equipment Utilized During Treatment: Gait belt;Oxygen Activity Tolerance: Patient limited by fatigue Patient left: in chair;with call bell/phone within reach;with chair alarm set     Time: 1535-1600 PT Time Calculation (min) (ACUTE ONLY): 25 min  Charges:  $Gait Training: 8-22 mins $Therapeutic Exercise: 8-22 mins                     G CodesLeitha Bleak 2015/02/07, 4:17 PM Leitha Bleak, [place]

## 2015-01-08 NOTE — Progress Notes (Signed)
Inpatient Diabetes Program Recommendations  AACE/ADA: New Consensus Statement on Inpatient Glycemic Control (2013)  Target Ranges:  Prepandial:   less than 140 mg/dL      Peak postprandial:   less than 180 mg/dL (1-2 hours)      Critically ill patients:  140 - 180 mg/dL   Results for ELIGH, RYBACKI SR. (MRN [id number]) as of 01/08/2015 09:16  Ref. Range 01/07/2015 07:48 01/07/2015 11:42 01/07/2015 16:13 01/07/2015 19:14 01/08/2015 08:05  Glucose-Capillary Latest Ref Range: 65-99 mg/dL 229 (H) 266 (H) 161 (H) 156 (H) 213 (H)   Diabetes history: No Outpatient Diabetes medications: NA Current orders for Inpatient glycemic control: Novolog 0-15 units TID with meals, Novolog 0-5 units HS  Inpatient Diabetes Program Recommendations Insulin - Basal: If steroids are continued as ordered, please consider ordering Levemir 8 units daily. Please note that if basal insulin is ordered it will likely need to be adjusted as steroids are tapered.  Thanks, Barnie Alderman, RN, MSN, CCRN, CDE Diabetes Coordinator Inpatient Diabetes Program [phone] (Team Pager from [place] to [place]) [phone] ([facility] office) [phone] [facility] office) [phone] [facility] office)

## 2015-01-08 NOTE — Progress Notes (Signed)
Patient resting quietly most of the day. Pain improved with medications. No other complaints. Worked with PT today.  Toniann Ket

## 2015-01-08 NOTE — Clinical Social Work Placement (Signed)
   CLINICAL SOCIAL WORK PLACEMENT  NOTE  Date:  01/08/2015  Patient Details  Name: Steven LICHTY Sr. MRN: [id number] Date of Birth: 29-Aug-1955  Clinical Social Work is seeking post-discharge placement for this patient at the [place] level of care (*CSW will initial, date and re-position this form in  chart as items are completed):  Yes   Patient/family provided with [place] Work Department's list of facilities offering this level of care within the geographic area requested by the patient (or if unable, by the patient's family).  Yes   Patient/family informed of their freedom to choose among providers that offer the needed level of care, that participate in Medicare, Medicaid or managed care program needed by the patient, have an available bed and are willing to accept the patient.  Yes   Patient/family informed of [place]'s ownership interest in [facility] and [facility], as well as of the fact that they are under no obligation to receive care at these facilities.  PASRR submitted to EDS on       PASRR number received on       Existing PASRR number confirmed on 01/08/15     FL2 transmitted to all facilities in geographic area requested by pt/family on 01/08/15     FL2 transmitted to all facilities within larger geographic area on 01/08/15     Patient informed that his/her managed care company has contracts with or will negotiate with certain facilities, including the following:        Yes   Patient/family informed of bed offers received.  Patient chooses bed at [facility]     Physician recommends and patient chooses bed at      Patient to be transferred to   on  .  Patient to be transferred to facility by       Patient family notified on   of transfer.  Name of family member notified:        PHYSICIAN       Additional Comment:   Pt is level II PASSAR.  Currently waiting on [place] must  response. _______________________________________________ Mathews Argyle, LCSW 01/08/2015, 4:40 PM

## 2015-01-09 LAB — GLUCOSE, CAPILLARY
Glucose-Capillary: 173 mg/dL — ABNORMAL HIGH (ref 65–99)
Glucose-Capillary: 206 mg/dL — ABNORMAL HIGH (ref 65–99)
Glucose-Capillary: 165 mg/dL — ABNORMAL HIGH (ref 65–99)

## 2015-01-09 MED ORDER — OXYCODONE HCL 5 MG PO TABS: 10.0000 mg | ORAL_TABLET | Freq: Four times a day (QID) | ORAL | Status: DC | PRN

## 2015-01-09 MED ORDER — OXYCODONE HCL 15 MG PO TABS: 15.0000 mg | ORAL_TABLET | Freq: Four times a day (QID) | ORAL | Status: DC | PRN

## 2015-01-09 MED ORDER — INSULIN ASPART 100 UNIT/ML ~~LOC~~ SOLN: 0.0000 [IU] | Freq: Three times a day (TID) | SUBCUTANEOUS | Status: DC

## 2015-01-09 MED ORDER — BENZONATATE 200 MG PO CAPS: 200.0000 mg | ORAL_CAPSULE | Freq: Three times a day (TID) | ORAL | Status: DC | PRN

## 2015-01-09 MED ORDER — ALBUTEROL SULFATE HFA 108 (90 BASE) MCG/ACT IN AERS: 2.0000 | INHALATION_SPRAY | Freq: Four times a day (QID) | RESPIRATORY_TRACT | Status: DC | PRN

## 2015-01-09 MED ORDER — INSULIN ASPART 100 UNIT/ML ~~LOC~~ SOLN: 0.0000 [IU] | Freq: Every day | SUBCUTANEOUS | Status: DC

## 2015-01-09 MED ORDER — OXYCODONE HCL ER 20 MG PO T12A: 20.0000 mg | EXTENDED_RELEASE_TABLET | Freq: Two times a day (BID) | ORAL | Status: DC

## 2015-01-09 MED ORDER — DOXYCYCLINE HYCLATE 100 MG PO TABS: 100.0000 mg | ORAL_TABLET | Freq: Two times a day (BID) | ORAL | Status: DC

## 2015-01-09 MED ORDER — SENNOSIDES-DOCUSATE SODIUM 8.6-50 MG PO TABS: 1.0000 | ORAL_TABLET | Freq: Every evening | ORAL | Status: DC | PRN

## 2015-01-09 MED ORDER — ARFORMOTEROL TARTRATE 15 MCG/2ML IN NEBU: 15.0000 ug | INHALATION_SOLUTION | Freq: Two times a day (BID) | RESPIRATORY_TRACT | Status: DC

## 2015-01-09 MED ORDER — PREDNISONE 10 MG (21) PO TBPK: 10.0000 mg | ORAL_TABLET | Freq: Every day | ORAL | Status: DC

## 2015-01-09 MED ORDER — TRAZODONE HCL 100 MG PO TABS: 200.0000 mg | ORAL_TABLET | Freq: Every day | ORAL | Status: DC

## 2015-01-09 NOTE — Progress Notes (Signed)
Patient being discharge to [facility]. Packet prepared by CSW. Report called to [employer]. EMS called for transport.  Toniann Ket

## 2015-01-09 NOTE — Clinical Social Work Note (Signed)
CSW notified RN, pt, and facility that pt would DC today via EMS.  [place] in [place].  CSW signing off.

## 2015-01-09 NOTE — Progress Notes (Signed)
A&O, Up with one assist. Medicated for pain with improvement. O2 on 2 Liters. No S/S distress.

## 2015-01-09 NOTE — Clinical Social Work Note (Signed)
CSW spoke to pt again.  Per pt his friend is going to transport him.  He stated he was able to contact her and notify her that she needed to bring an O2 tank for transport.  Per pt, she will transport pt to [facility] facility.

## 2015-01-09 NOTE — Clinical Social Work Note (Signed)
CSW was notified by pt that his friend would transport him to SNF. CSW called to confirm this with pt's permission and pt's ride stated that she was not going to transport pt.  CSW notified pt that his friend did not confirm that she would be able to transport him, but that we still had EMS available for him to transport to facility.  Pt became agitated.  CSW will f/u with pt again this afternoon to confirm what he wants to do for his DC.  SNF facility is still prepared to accept him and EMS can still transport.

## 2015-01-09 NOTE — Clinical Social Work Note (Signed)
CSW attempted to confirm pt's transportation again.  Again his transportation stated that they would not be able to transport him.  CSW had this conversation with the pt in his room with his friend on speaker phone.  CSW verified with pt that if he did want to DC to [facility] he would need to go via EMS.  Pt agreed to transport to [facility] via EMS.  CSW left a VM asking [facility] to contact [facility] about pt's belongings.  CSW notified the [facility] that pt would like his belongings shipped if possible.  CSW signing off.

## 2015-01-09 NOTE — Progress Notes (Signed)
Dr. Volanda Napoleon notified of duplicate oxycodone PRN orders in discharge documents. MD to change.  Toniann Ket
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
## 2015-04-20 ENCOUNTER — Inpatient Hospital Stay (HOSPITAL_COMMUNITY)
Admission: AD | Admit: 2015-04-20 | Discharge: 2015-04-25 | DRG: 552 | Disposition: A | Discharge status: Nursing Home (Includes ICF) | Payer: Medicaid Other | Source: Ambulatory Visit | Attending: Internal Medicine | Admitting: Internal Medicine

## 2015-04-20 ENCOUNTER — Encounter (HOSPITAL_COMMUNITY): Payer: Self-pay | Admitting: *Deleted

## 2015-04-20 ENCOUNTER — Inpatient Hospital Stay (HOSPITAL_COMMUNITY): Payer: Medicaid Other

## 2015-04-20 DIAGNOSIS — F419 Anxiety disorder, unspecified: Secondary | ICD-10-CM | POA: Diagnosis present

## 2015-04-20 DIAGNOSIS — I10 Essential (primary) hypertension: Secondary | ICD-10-CM | POA: Diagnosis present

## 2015-04-20 DIAGNOSIS — J449 Chronic obstructive pulmonary disease, unspecified: Secondary | ICD-10-CM | POA: Diagnosis present

## 2015-04-20 DIAGNOSIS — G8929 Other chronic pain: Secondary | ICD-10-CM | POA: Diagnosis present

## 2015-04-20 DIAGNOSIS — M545 Low back pain: Principal | ICD-10-CM | POA: Diagnosis present

## 2015-04-20 DIAGNOSIS — R062 Wheezing: Secondary | ICD-10-CM

## 2015-04-20 DIAGNOSIS — F329 Major depressive disorder, single episode, unspecified: Secondary | ICD-10-CM | POA: Diagnosis present

## 2015-04-20 DIAGNOSIS — Z87891 Personal history of nicotine dependence: Secondary | ICD-10-CM

## 2015-04-20 DIAGNOSIS — D367 Benign neoplasm of other specified sites: Secondary | ICD-10-CM | POA: Diagnosis present

## 2015-04-20 DIAGNOSIS — M549 Dorsalgia, unspecified: Secondary | ICD-10-CM

## 2015-04-20 DIAGNOSIS — R739 Hyperglycemia, unspecified: Secondary | ICD-10-CM | POA: Diagnosis present

## 2015-04-20 DIAGNOSIS — K219 Gastro-esophageal reflux disease without esophagitis: Secondary | ICD-10-CM | POA: Diagnosis present

## 2015-04-20 DIAGNOSIS — J441 Chronic obstructive pulmonary disease with (acute) exacerbation: Secondary | ICD-10-CM | POA: Diagnosis present

## 2015-04-20 LAB — COMPREHENSIVE METABOLIC PANEL
ALK PHOS: 89 U/L (ref 38–126)
ALT: 16 U/L — AB (ref 17–63)
AST: 21 U/L (ref 15–41)
Albumin: 3.8 g/dL (ref 3.5–5.0)
Anion gap: 11 (ref 5–15)
BUN: 11 mg/dL (ref 6–20)
CO2: 28 mmol/L (ref 22–32)
CREATININE: 0.85 mg/dL (ref 0.61–1.24)
Calcium: 9 mg/dL (ref 8.9–10.3)
Chloride: 100 mmol/L — ABNORMAL LOW (ref 101–111)
GFR calc Af Amer: >60 mL/min (ref >60)
Glucose, Bld: 153 mg/dL — ABNORMAL HIGH (ref 65–99)
Potassium: 3.7 mmol/L (ref 3.5–5.1)
Sodium: 139 mmol/L (ref 135–145)
TOTAL PROTEIN: 6.9 g/dL (ref 6.5–8.1)
Total Bilirubin: 0.5 mg/dL (ref 0.3–1.2)

## 2015-04-20 LAB — APTT: APTT: 28 s (ref 24–37)

## 2015-04-20 LAB — TSH: TSH: 0.941 u[IU]/mL (ref 0.350–4.500)

## 2015-04-20 LAB — CBC
HCT: 40.8 % (ref 39.0–52.0)
Hemoglobin: 13.7 g/dL (ref 13.0–17.0)
MCH: 30.4 pg (ref 26.0–34.0)
MCHC: 33.6 g/dL (ref 30.0–36.0)
MCV: 90.5 fL (ref 78.0–100.0)
PLATELETS: 179 10*3/uL (ref 150–400)
RBC: 4.51 MIL/uL (ref 4.22–5.81)
RDW: 13.7 % (ref 11.5–15.5)
WBC: 7.3 10*3/uL (ref 4.0–10.5)

## 2015-04-20 LAB — SEDIMENTATION RATE: Sed Rate: 14 mm/hr (ref 0–16)

## 2015-04-20 LAB — PROTIME-INR
INR: 1.03 (ref 0.00–1.49)
Prothrombin Time: 13.7 seconds (ref 11.6–15.2)

## 2015-04-20 MED ORDER — MORPHINE SULFATE (PF) 2 MG/ML IV SOLN
1.0000 mg | Freq: Four times a day (QID) | INTRAVENOUS | Status: DC | PRN
  Administered 2015-04-21 (×2): 1 mg via INTRAVENOUS
  Filled 2015-04-20 (×2): qty 1

## 2015-04-20 MED ORDER — PREGABALIN 75 MG PO CAPS
75.0000 mg | ORAL_CAPSULE | Freq: Four times a day (QID) | ORAL | Status: DC
  Administered 2015-04-21 (×5): 75 mg via ORAL
  Filled 2015-04-20 (×5): qty 1

## 2015-04-20 MED ORDER — PRAZOSIN HCL 1 MG PO CAPS
1.0000 mg | ORAL_CAPSULE | Freq: Every day | ORAL | Status: DC
  Administered 2015-04-21 – 2015-04-24 (×4): 1 mg via ORAL
  Filled 2015-04-20 (×6): qty 1

## 2015-04-20 MED ORDER — ENOXAPARIN SODIUM 40 MG/0.4ML ~~LOC~~ SOLN
40.0000 mg | SUBCUTANEOUS | Status: DC
  Administered 2015-04-20 – 2015-04-24 (×5): 40 mg via SUBCUTANEOUS
  Filled 2015-04-20 (×5): qty 0.4

## 2015-04-20 MED ORDER — ALBUTEROL SULFATE (2.5 MG/3ML) 0.083% IN NEBU
3.0000 mL | INHALATION_SOLUTION | Freq: Four times a day (QID) | RESPIRATORY_TRACT | Status: DC | PRN
  Administered 2015-04-21: 3 mL via RESPIRATORY_TRACT
  Filled 2015-04-20: qty 3

## 2015-04-20 MED ORDER — TRAZODONE HCL 50 MG PO TABS
200.0000 mg | ORAL_TABLET | Freq: Every day | ORAL | Status: DC
  Administered 2015-04-21 – 2015-04-24 (×5): 200 mg via ORAL
  Filled 2015-04-20 (×5): qty 4

## 2015-04-20 MED ORDER — LORATADINE 10 MG PO TABS
10.0000 mg | ORAL_TABLET | Freq: Every day | ORAL | Status: DC
  Administered 2015-04-21 – 2015-04-25 (×5): 10 mg via ORAL
  Filled 2015-04-20 (×5): qty 1

## 2015-04-20 MED ORDER — FUROSEMIDE 20 MG PO TABS
20.0000 mg | ORAL_TABLET | Freq: Every day | ORAL | Status: DC
  Administered 2015-04-21 – 2015-04-25 (×5): 20 mg via ORAL
  Filled 2015-04-20 (×5): qty 1

## 2015-04-20 MED ORDER — SODIUM CHLORIDE 0.9 % IJ SOLN: 3.0000 mL | INTRAMUSCULAR | Status: DC | PRN

## 2015-04-20 MED ORDER — DULOXETINE HCL 60 MG PO CPEP
60.0000 mg | ORAL_CAPSULE | Freq: Every day | ORAL | Status: DC
  Administered 2015-04-21: 60 mg via ORAL
  Filled 2015-04-20: qty 1

## 2015-04-20 MED ORDER — SENNOSIDES-DOCUSATE SODIUM 8.6-50 MG PO TABS
1.0000 | ORAL_TABLET | Freq: Every evening | ORAL | Status: DC | PRN
  Administered 2015-04-23 – 2015-04-25 (×2): 1 via ORAL
  Filled 2015-04-20 (×2): qty 1

## 2015-04-20 MED ORDER — POLYETHYLENE GLYCOL 3350 17 G PO PACK
17.0000 g | PACK | Freq: Every day | ORAL | Status: DC | PRN
Start: 2015-04-20 — End: 2015-04-25
  Administered 2015-04-23 – 2015-04-24 (×2): 17 g via ORAL
  Filled 2015-04-20 (×3): qty 1

## 2015-04-20 MED ORDER — OXYCODONE HCL ER 20 MG PO T12A
20.0000 mg | EXTENDED_RELEASE_TABLET | Freq: Two times a day (BID) | ORAL | Status: DC
  Administered 2015-04-21 – 2015-04-25 (×10): 20 mg via ORAL
  Filled 2015-04-20 (×10): qty 1

## 2015-04-20 MED ORDER — MELATONIN 5 MG PO TABS: 5.0000 mg | ORAL_TABLET | Freq: Every day | ORAL | Status: DC

## 2015-04-20 MED ORDER — FLUTICASONE FUROATE-VILANTEROL 100-25 MCG/INH IN AEPB: INHALATION_SPRAY | Freq: Every day | RESPIRATORY_TRACT | Status: DC

## 2015-04-20 MED ORDER — BENZONATATE 100 MG PO CAPS: 200.0000 mg | ORAL_CAPSULE | Freq: Three times a day (TID) | ORAL | Status: DC | PRN

## 2015-04-20 MED ORDER — SODIUM CHLORIDE 0.9 % IV SOLN: 250.0000 mL | INTRAVENOUS | Status: DC | PRN

## 2015-04-20 MED ORDER — OXYCODONE HCL 5 MG PO TABS
10.0000 mg | ORAL_TABLET | Freq: Two times a day (BID) | ORAL | Status: DC | PRN
  Administered 2015-04-21: 10 mg via ORAL
  Filled 2015-04-20: qty 2

## 2015-04-20 MED ORDER — SODIUM CHLORIDE 0.9 % IJ SOLN
3.0000 mL | Freq: Two times a day (BID) | INTRAMUSCULAR | Status: DC
  Administered 2015-04-20 – 2015-04-24 (×8): 3 mL via INTRAVENOUS

## 2015-04-20 MED ORDER — ADULT MULTIVITAMIN W/MINERALS CH
1.0000 | ORAL_TABLET | Freq: Every day | ORAL | Status: DC
  Administered 2015-04-21 – 2015-04-25 (×5): 1 via ORAL
  Filled 2015-04-20 (×10): qty 1

## 2015-04-20 MED ORDER — INFLUENZA VAC SPLIT QUAD 0.5 ML IM SUSY
0.5000 mL | PREFILLED_SYRINGE | INTRAMUSCULAR | Status: AC
  Administered 2015-04-22: 0.5 mL via INTRAMUSCULAR
  Filled 2015-04-20: qty 0.5

## 2015-04-20 MED ORDER — ONDANSETRON HCL 4 MG/2ML IJ SOLN
4.0000 mg | Freq: Four times a day (QID) | INTRAMUSCULAR | Status: DC | PRN
  Administered 2015-04-22 – 2015-04-23 (×3): 4 mg via INTRAVENOUS
  Filled 2015-04-20 (×3): qty 2
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
## 2015-04-21 ENCOUNTER — Inpatient Hospital Stay (HOSPITAL_COMMUNITY): Payer: Medicaid Other

## 2015-04-21 LAB — RAPID URINE DRUG SCREEN, HOSP PERFORMED
Amphetamines: NOT DETECTED
BARBITURATES: NOT DETECTED
BENZODIAZEPINES: NOT DETECTED
COCAINE: NOT DETECTED
OPIATES: POSITIVE — AB
TETRAHYDROCANNABINOL: POSITIVE — AB

## 2015-04-21 LAB — PSA: PSA: 7.58 ng/mL — ABNORMAL HIGH (ref 0.00–4.00)

## 2015-04-21 MED ORDER — ALBUTEROL SULFATE (2.5 MG/3ML) 0.083% IN NEBU
3.0000 mL | INHALATION_SOLUTION | Freq: Four times a day (QID) | RESPIRATORY_TRACT | Status: DC
  Administered 2015-04-21 – 2015-04-25 (×14): 3 mL via RESPIRATORY_TRACT
  Filled 2015-04-21 (×13): qty 3

## 2015-04-21 MED ORDER — LEVOFLOXACIN IN D5W 500 MG/100ML IV SOLN
500.0000 mg | INTRAVENOUS | Status: DC
  Administered 2015-04-22 – 2015-04-23 (×2): 500 mg via INTRAVENOUS
  Filled 2015-04-21 (×2): qty 100

## 2015-04-21 MED ORDER — ARFORMOTEROL TARTRATE 15 MCG/2ML IN NEBU
15.0000 ug | INHALATION_SOLUTION | Freq: Two times a day (BID) | RESPIRATORY_TRACT | Status: DC
  Administered 2015-04-21 – 2015-04-25 (×6): 15 ug via RESPIRATORY_TRACT
  Filled 2015-04-21 (×7): qty 2

## 2015-04-21 MED ORDER — OXYCODONE HCL 5 MG PO TABS
10.0000 mg | ORAL_TABLET | Freq: Four times a day (QID) | ORAL | Status: DC | PRN
Start: 2015-04-21 — End: 2015-04-25
  Administered 2015-04-21 – 2015-04-25 (×15): 10 mg via ORAL
  Filled 2015-04-21 (×15): qty 2

## 2015-04-21 MED ORDER — LIDOCAINE 5 % EX PTCH
1.0000 | MEDICATED_PATCH | CUTANEOUS | Status: DC
  Administered 2015-04-21 – 2015-04-24 (×4): 1 via TRANSDERMAL
  Filled 2015-04-21 (×7): qty 1

## 2015-04-21 MED ORDER — METHYLPREDNISOLONE SODIUM SUCC 125 MG IJ SOLR
80.0000 mg | Freq: Once | INTRAMUSCULAR | Status: AC
  Administered 2015-04-21: 80 mg via INTRAVENOUS
  Filled 2015-04-21: qty 2

## 2015-04-21 MED ORDER — LEVOFLOXACIN IN D5W 500 MG/100ML IV SOLN: 500.0000 mg | INTRAVENOUS | Status: DC

## 2015-04-21 MED ORDER — LEVOFLOXACIN 500 MG PO TABS
500.0000 mg | ORAL_TABLET | Freq: Every day | ORAL | Status: DC
  Administered 2015-04-21: 500 mg via ORAL
  Filled 2015-04-21: qty 1

## 2015-04-21 MED ORDER — PREDNISONE 20 MG PO TABS
60.0000 mg | ORAL_TABLET | Freq: Every day | ORAL | Status: DC
  Administered 2015-04-21 – 2015-04-22 (×2): 60 mg via ORAL
  Filled 2015-04-21 (×2): qty 3

## 2015-04-21 NOTE — Progress Notes (Signed)

## 2015-04-21 NOTE — Clinical Social Work Note (Signed)
Clinical Social Work Assessment  Patient Details  Name: Steven LEWAN Sr. MRN: [id number] Date of Birth: [birth year]  Date of referral:  04/21/15               Reason for consult:  Facility Placement                Permission sought to share information with:    Permission granted to share information::     Name::        Agency::     Relationship::     Contact Information:     Housing/Transportation Living arrangements for the past 2 months:  [place], [place] of Information:  Patient Patient Interpreter Needed:  None Criminal Activity/Legal Involvement Pertinent to Current Situation/Hospitalization:  No - Comment as needed Significant Relationships:  Adult Children, Siblings, Other Family Members Lives with:  Facility Resident Do you feel safe going back to the place where you live?  Yes Need for family participation in patient care:  Yes (Comment)  Care giving concerns:  None identified.  Social Worker assessment / plan:  CSW met with patient who advised that he had been a resident at [facility] for the past two weeks.  He reported that prior to going to [facility], he was at [facility] in [place] for four months for rehab.  Patient advised that he ambulates mainly with a rolling walker and a cane.  He reported that he has a supportive family in his brother, niece and son.  He stated that he does not know whether he wants to return to the facility due to not giving him enough pain medications.  CSW spoke with Maudie Mercury at [facility] who confirmed patient's statements. She did indicated that she was only aware of patient's brother being a support. She advised that patient can return to the facility upon discharge.  CSW provided patient with a ALF list for facilities in [place], [place], as he indicated that he may want to go to a facility in that area.    Employment status:  Disabled (Comment on whether or not currently receiving  Disability) Insurance information:  [place] PT Recommendations:   (ALF) Information / Referral to community resources:   (ALF's in [place])  Patient/Family's Response to care:  Patient is agreeable to remain in an ALF.  Patient/Family's Understanding of and Emotional Response to Diagnosis, Current Treatment, and Prognosis:  Patient understands his diagnosis, treatment and prognosis.    Emotional Assessment Appearance:  Appears stated age Attitude/Demeanor/Rapport:   (Cooperative) Affect (typically observed):  Calm Orientation:  Oriented to Self, Oriented to Place, Oriented to  Time, Oriented to Situation Alcohol / Substance use:  Not Applicable Psych involvement (Current and /or in the community):  No (Comment)  Discharge Needs  Concerns to be addressed:  Discharge Planning Concerns Readmission within the last 30 days:  No Current discharge risk:  None Barriers to Discharge:  No Barriers Identified   Ihor Gully, LCSW 04/21/2015, 1:03 PM [phone]

## 2015-04-21 NOTE — Progress Notes (Signed)
Subjective: Intractable back pain:  Pt states that his pain is about 5/10.  Doing better with increase in oxycontin.   Hyperglycemia: pt is not known to be a diabetic.   Copd slight cough, yellow sputum this am. + wheezing. Pt denies fever, chills, cp, palp.  Has baseline sob.    Objective: Vital signs in last 24 hours: Temp:  [97.6 F (36.4 C)-98.9 F (37.2 C)] 97.6 F (36.4 C) (10/25 0601) Pulse Rate:  [63-93] 63 (10/25 0601) Resp:  [16-18] 16 (10/25 0601) BP: (99-120)/(67-71) 99/67 mmHg (10/25 0601) SpO2:  [94 %-98 %] 94 % (10/25 0601) Weight:  [96.616 kg (213 lb)-96.752 kg (213 lb 4.8 oz)] 96.616 kg (213 lb) (10/25 8088) Weight change:  Last BM Date: 04/20/15  Intake/Output from previous day: 10/24 0701 - 10/25 0700 In: 480 [P.O.:480] Out: 1000 [Urine:1000] Intake/Output this shift:     Heent: anicteric Neck: no jvd Heart: rrr s1, s2 Lung: prolonged exp phase, + wheezing ABd: soft, nt, nd +bs Ext: no c/c/e slr negative Motor 5-/5 bilateral lower ext  Lab Results:  Recent Labs  04/20/15 2045  WBC 7.3  HGB 13.7  HCT 40.8  PLT 179   BMET  Recent Labs  04/20/15 2045  NA 139  K 3.7  CL 100*  CO2 28  GLUCOSE 153*  BUN 11  CREATININE 0.85  CALCIUM 9.0    Studies/Results: Dg Lumbar Spine 2-3 Views  04/20/2015  CLINICAL DATA:  Back pain for 5 years with no relief. Previous back surgery. EXAM: LUMBAR SPINE - 2-3 VIEW COMPARISON:  11/13/2011 FINDINGS: Again noted is bilateral pedicle screw and rod fixation at L4-L5. Stable grade 2 anterolisthesis at L4-L5. The interbody device at L4-L5 appears stable. The vertebral body heights are maintained. IMPRESSION: Postsurgical changes in the lower lumbar spine. No acute bone abnormality. Electronically Signed   By: Markus Daft M.D.   On: 04/20/2015 21:35    Medications: I have reviewed this patient medications  Assessment/Plan: Intractable back pain Add lidoderm patch Cont oxycontin, cont oxycodone, cont  morphine PT to evaluate and tx,  Social work consult for placement  Copd exacerbation Start on [employer] on prednisone Add brovana since no tudorza here Cont breo Cont abluterol  Hyperglycemia Check hga1c    Anxiety Stable  DVT prophylaxis: lovenox    LOS: 1 day   Jani Gravel 04/21/2015, 7:38 AM

## 2015-04-21 NOTE — Care Management Note (Signed)
Case Management Note  Patient Details  Name: Steven DORANTES Sr. MRN: [id number] Date of Birth: 27-May-1956  Subjective/Objective:                  Pt admitted from [facility]F with intractable back pain. Pt would like to discharge to new facility at discharge. Pt has a walker and rollator for home use.  Action/Plan: CSW is aware and will discuss with pt about new facility and arrange discharge once medically stable.  Expected Discharge Date:                  Expected Discharge Plan:  Assisted Living / Rest Home  In-House Referral:  Clinical Social Work  Discharge planning Services  CM Consult  Post Acute Care Choice:  NA Choice offered to:  NA  DME Arranged:    DME Agency:     HH Arranged:    [place] Agency:     Status of Service:  Completed, signed off  Medicare Important Message Given:    Date Medicare IM Given:    Medicare IM give by:    Date Additional Medicare IM Given:    Additional Medicare Important Message give by:     If discussed at [place] of Stay Meetings, dates discussed:    Additional Comments:  Joylene Draft, RN 04/21/2015, 10:53 AM

## 2015-04-21 NOTE — Evaluation (Signed)
Physical Therapy Evaluation Patient Details Name: Steven SOLANKI Sr. MRN: [id number] DOB: Jul 11, 1955 Today's Date: 04/21/2015   History of Present Illness  59 yo male with Copd, hx of chronic back pain, c/o intractable back pain. Pt states that the pain radiates down bilateral legs. ? Slight weakness. Denies wt loss, incontinence, fever, chills, flank pain, n/v, dysuria, hematuria. Pt c/o 9/10 pain this evening. No recent MRI or imaging. Pt will be admitted for intractable back pain  Clinical Impression   Pt was seen for evaluation.  He states that he is currently residing in ACLF and 2 weeks prior to that he completed a 3 month stay in SNF.  He is a robust appearing male who was very alert and oriented, cooperative.  He states that in 2014 he had a severe fall at his work place and since then has had a lot of lumbar surgery.  He takes pain medicine daily to control back pain.     He states that the [facility] he had just moved to refused to give him any pain medicine and this is the reason for the flare up of his pain.  Currently, his pain level is "5", his strength is WNL, balance is WNL.  He has been fully versed in transfer technique to protect his back and in lumbar ROM/strengthening exercise at SNF.  He states that he does his exercise daily.  He is able to ambulate with a walker for 66' today however I believe he could have walked farther if he had not seen his lunch being delivered to his room.  We will follow him to increase ambulatory endurance.  He should be able to transition to home setting at d/c.    Follow Up Recommendations No PT follow up    Equipment Recommendations  None recommended by PT    Recommendations for Other Services   none    Precautions / Restrictions Precautions Precautions: None Restrictions Weight Bearing Restrictions: No      Mobility  Bed Mobility Overal bed mobility: Modified Independent             General bed mobility comments: proficient in  log rolling in and out of bed  Transfers Overall transfer level: Modified independent Equipment used: Rolling walker (2 wheeled)                Ambulation/Gait Ambulation/Gait assistance: Modified independent (Device/Increase time) Ambulation Distance (Feet): 60 Feet Assistive device: Rolling walker (2 wheeled) Gait Pattern/deviations: Decreased stride length   Gait velocity interpretation: <1.8 ft/sec, indicative of risk for recurrent falls General Gait Details: tends to carry his body weight on both UEs during gait thereby minimizing LE weight bearing.  He states that this helps to decrease his back pain  Stairs            Wheelchair Mobility    Modified Rankin (Stroke Patients Only)       Balance Overall balance assessment: No apparent balance deficits (not formally assessed)                                           Pertinent Vitals/Pain Pain Assessment: 0-10 Pain Score: 5  Pain Location: low back Pain Descriptors / Indicators: Aching Pain Intervention(s): Limited activity within patient's tolerance;Monitored during session;Premedicated before session    Home Living Family/patient expects to be discharged to:: Assisted living  Home Equipment: [place] - 2 wheels;Walker - 4 wheels;Cane - single point      Prior Function Level of Independence: Independent with assistive device(s)         Comments: ambulates with either a walker or cane due to chronic lumbar pain/dysfunction     Hand Dominance        Extremity/Trunk Assessment               Lower Extremity Assessment: Overall WFL for tasks assessed         Communication   Communication: No difficulties  Cognition Arousal/Alertness: Awake/alert Behavior During Therapy: WFL for tasks assessed/performed Overall Cognitive Status: Within Functional Limits for tasks assessed                      General Comments      Exercises         Assessment/Plan    PT Assessment Patient needs continued PT services  PT Diagnosis Difficulty walking;Abnormality of gait   PT Problem List Decreased activity tolerance;Decreased mobility;Pain  PT Treatment Interventions Gait training   PT Goals (Current goals can be found in the Care Plan section) Acute Rehab PT Goals Patient Stated Goal: none stated PT Goal Formulation: With patient Time For Goal Achievement: 04/28/15 Potential to Achieve Goals: Good    Frequency Min 2X/week   Barriers to discharge   none    Co-evaluation               End of Session Equipment Utilized During Treatment: Gait belt Activity Tolerance: Patient tolerated treatment well;No increased pain Patient left: in bed;with call bell/phone within reach           Time: 1128-1156 PT Time Calculation (min) (ACUTE ONLY): 28 min   Charges:   PT Evaluation $Initial PT Evaluation Tier I: 1 Procedure     PT G CodesSable Feil  PT 04/21/2015, 12:20 PM [phone]

## 2015-04-21 NOTE — Progress Notes (Signed)
Patient requesting "Oxycodone every 6 hours as needed."  Patient states that he has been taking it this way.  Reports his back pain 7/10.  Dr. Maudie Mercury notified. Dr. Maudie Mercury stated to give the Morphine and he will change the Oxycodone.

## 2015-04-22 ENCOUNTER — Inpatient Hospital Stay (HOSPITAL_COMMUNITY): Payer: Medicaid Other

## 2015-04-22 LAB — COMPREHENSIVE METABOLIC PANEL
ALT: 16 U/L — AB (ref 17–63)
AST: 19 U/L (ref 15–41)
Albumin: 3.6 g/dL (ref 3.5–5.0)
Alkaline Phosphatase: 80 U/L (ref 38–126)
Anion gap: 6 (ref 5–15)
BILIRUBIN TOTAL: 0.2 mg/dL — AB (ref 0.3–1.2)
BUN: 12 mg/dL (ref 6–20)
CHLORIDE: 101 mmol/L (ref 101–111)
CO2: 32 mmol/L (ref 22–32)
CREATININE: 0.92 mg/dL (ref 0.61–1.24)
Calcium: 8.7 mg/dL — ABNORMAL LOW (ref 8.9–10.3)
GFR calc Af Amer: >60 mL/min (ref >60)
Glucose, Bld: 113 mg/dL — ABNORMAL HIGH (ref 65–99)
Potassium: 4 mmol/L (ref 3.5–5.1)
Sodium: 139 mmol/L (ref 135–145)
TOTAL PROTEIN: 6.6 g/dL (ref 6.5–8.1)

## 2015-04-22 LAB — LIPID PANEL
CHOLESTEROL: 160 mg/dL (ref 0–200)
HDL: 44 mg/dL (ref >40)
LDL Cholesterol: 96 mg/dL (ref 0–99)
Total CHOL/HDL Ratio: 3.6 RATIO
Triglycerides: 98 mg/dL (ref <150)
VLDL: 20 mg/dL (ref 0–40)

## 2015-04-22 LAB — CBC
HEMATOCRIT: 39.2 % (ref 39.0–52.0)
Hemoglobin: 12.8 g/dL — ABNORMAL LOW (ref 13.0–17.0)
MCH: 29.8 pg (ref 26.0–34.0)
MCHC: 32.7 g/dL (ref 30.0–36.0)
MCV: 91.2 fL (ref 78.0–100.0)
Platelets: 164 10*3/uL (ref 150–400)
RBC: 4.3 MIL/uL (ref 4.22–5.81)
RDW: 13.9 % (ref 11.5–15.5)
WBC: 8.6 10*3/uL (ref 4.0–10.5)

## 2015-04-22 MED ORDER — METHYLPREDNISOLONE SODIUM SUCC 40 MG IJ SOLR
40.0000 mg | Freq: Three times a day (TID) | INTRAMUSCULAR | Status: DC
  Administered 2015-04-22 – 2015-04-24 (×6): 40 mg via INTRAVENOUS
  Filled 2015-04-22 (×6): qty 1

## 2015-04-22 MED ORDER — GABAPENTIN 600 MG PO TABS: 600.0000 mg | ORAL_TABLET | Freq: Four times a day (QID) | ORAL | Status: DC

## 2015-04-22 MED ORDER — GABAPENTIN 300 MG PO CAPS
600.0000 mg | ORAL_CAPSULE | Freq: Four times a day (QID) | ORAL | Status: DC
  Administered 2015-04-22 – 2015-04-25 (×13): 600 mg via ORAL
  Filled 2015-04-22 (×14): qty 2

## 2015-04-22 MED ORDER — FLUOXETINE HCL 20 MG PO CAPS
40.0000 mg | ORAL_CAPSULE | Freq: Every day | ORAL | Status: DC
  Administered 2015-04-22 – 2015-04-25 (×4): 40 mg via ORAL
  Filled 2015-04-22 (×4): qty 2

## 2015-04-22 NOTE — NC FL2 (Signed)
[place] LEVEL OF CARE SCREENING TOOL     IDENTIFICATION  Patient Name: Steven LEGLER Sr. Birthdate: 1956/04/13 Sex: male Admission Date (Current Location): 04/20/2015  [facility] and [place] Number:  [place]) [phone] [place] and Address:         Provider Number: [id number]  Attending Physician Name and Address:  Jani Gravel, MD  Relative Name and Phone Number:       Current Level of Care: Hospital Recommended Level of Care: [place] Prior Approval Number:    Date Approved/Denied:   PASRR Number:    Discharge Plan: Other (Comment) (Return to [facility]F)    Current Diagnoses: Patient Active Problem List   Diagnosis Date Noted  . Intractable back pain 04/20/2015  . Acute on chronic respiratory failure with hypoxemia ([place]) 01/03/2015  . Alcohol dependence with uncomplicated withdrawal ([place])   . Major depressive disorder, recurrent, severe without psychotic features ([place])   . Uncomplicated alcohol dependence ([place])   . MDD (major depressive disorder), recurrent episode, severe ([place]) 06/24/2014  . Alcohol abuse 06/24/2014  . Alcohol dependence ([place]) 06/24/2014  . Alcohol dependence with withdrawal, uncomplicated ([place]) 02/07/4817  . Major depression, single episode 06/23/2014  . Suicidal ideations 06/23/2014  . Laryngeal pain 04/30/2014  . Neurosis, posttraumatic 02/24/2014  . Back ache 02/22/2014  . Chronic depression 02/22/2014  . Personal history of traumatic fracture 02/22/2014  . Coughing 01/23/2013  . COPD (chronic obstructive pulmonary disease) ([place]) 11/13/2012  . Abnormal CXR 11/13/2012  . Drug-seeking behavior 11/13/2012  . Odynophagia 10/31/2012  . Oral thrush 10/31/2012  . Acute-on-chronic respiratory failure ([place]) 10/29/2012  . COPD exacerbation ([place]) 10/28/2012  . Adrenal insufficiency ([place]) 10/28/2012  . Acute adrenal crisis ([place]) 10/28/2012  . Pituitary adenoma ([place]) 10/28/2012  . Anxiety disorder 10/28/2012     Orientation ACTIVITIES/SOCIAL BLADDER RESPIRATION   (Oriented x4)   Continent Normal  BEHAVIORAL SYMPTOMS/MOOD NEUROLOGICAL BOWEL NUTRITION STATUS      Continent  (Regular)  PHYSICIAN VISITS COMMUNICATION OF NEEDS Height & Weight Skin    Verbally   216 lbs. Normal          AMBULATORY STATUS RESPIRATION    Assist independent (uses walker) Normal      Personal Care Assistance Level of Assistance               Functional Limitations Info                SPECIAL CARE FACTORS FREQUENCY  PT (By licensed PT) (minimum 2x/week)     PT Frequency:  (minimum 2x/per week)             Additional Factors Info  Code Status, Allergies (Full Code.) Code Status Info:  (Full Code) Allergies Info:  (Spiriva (Tiotropium Bromide Monohydrate), Acetaminophen, Hydrocodone-acetaminophen, Penicillins, Advair Diskus (Fluticasone-Salmeterol))           Current Medications (04/22/2015): Current Facility-Administered Medications  Medication Dose Route Frequency Provider Last Rate Last Dose  . 0.9 %  sodium chloride infusion  250 mL Intravenous PRN Jani Gravel, MD      . albuterol (PROVENTIL) (2.5 MG/3ML) 0.083% nebulizer solution 3 mL  3 mL Inhalation Q6H WA Jani Gravel, MD   3 mL at 04/22/15 0751  . arformoterol (BROVANA) nebulizer solution 15 mcg  15 mcg Nebulization BID Jani Gravel, MD   15 mcg at 04/21/15 1917  . benzonatate (TESSALON) capsule 200 mg  200 mg Oral TID PRN Jani Gravel, MD      .  enoxaparin (LOVENOX) injection 40 mg  40 mg Subcutaneous Q24H Jani Gravel, MD   40 mg at 04/21/15 2108  . FLUoxetine (PROZAC) capsule 40 mg  40 mg Oral Daily Jani Gravel, MD   40 mg at 04/22/15 0930  . furosemide (LASIX) tablet 20 mg  20 mg Oral Daily Jani Gravel, MD   20 mg at 04/22/15 0932  . gabapentin (NEURONTIN) capsule 600 mg  600 mg Oral QID Jani Gravel, MD   600 mg at 04/22/15 0932  . levofloxacin (LEVAQUIN) IVPB 500 mg  500 mg Intravenous Q24H Jani Gravel, MD      . lidocaine (LIDODERM) 5 % 1 patch   1 patch Transdermal Q24H Jani Gravel, MD   1 patch at 04/22/15 [phone]  . loratadine (CLARITIN) tablet 10 mg  10 mg Oral Daily Jani Gravel, MD   10 mg at 04/22/15 0932  . methylPREDNISolone sodium succinate (SOLU-MEDROL) 40 mg/mL injection 40 mg  40 mg Intravenous 3 times per day Jani Gravel, MD      . multivitamin with minerals tablet 1 tablet  1 tablet Oral Daily Jani Gravel, MD   1 tablet at 04/22/15 0933  . ondansetron (ZOFRAN) injection 4 mg  4 mg Intravenous Q6H PRN Jani Gravel, MD   4 mg at 04/22/15 0353  . oxyCODONE (Oxy IR/ROXICODONE) immediate release tablet 10 mg  10 mg Oral Q6H PRN Jani Gravel, MD   10 mg at 04/22/15 0811  . OxyCODONE (OXYCONTIN) 12 hr tablet 20 mg  20 mg Oral Q12H Jani Gravel, MD   20 mg at 04/22/15 0929  . polyethylene glycol (MIRALAX / GLYCOLAX) packet 17 g  17 g Oral Daily PRN Jani Gravel, MD      . prazosin (MINIPRESS) capsule 1 mg  1 mg Oral QHS Jani Gravel, MD   1 mg at 04/21/15 2108  . senna-docusate (Senokot-S) tablet 1 tablet  1 tablet Oral QHS PRN Jani Gravel, MD      . sodium chloride 0.9 % injection 3 mL  3 mL Intravenous Q12H Jani Gravel, MD   3 mL at 04/22/15 0933  . sodium chloride 0.9 % injection 3 mL  3 mL Intravenous PRN Jani Gravel, MD      . traZODone (DESYREL) tablet 200 mg  200 mg Oral QHS Jani Gravel, MD   200 mg at 04/21/15 2108   Do not use this list as official medication orders. Please verify with discharge summary.  Discharge Medications:   Medication List    ASK your doctor about these medications        albuterol 108 (90 BASE) MCG/ACT inhaler  Commonly known as:  PROVENTIL HFA;VENTOLIN HFA  Inhale 2 puffs into the lungs every 6 (six) hours as needed for wheezing or shortness of breath.     arformoterol 15 MCG/2ML Nebu  Commonly known as:  BROVANA  Take 2 mLs (15 mcg total) by nebulization every 12 (twelve) hours.     benzonatate 200 MG capsule  Commonly known as:  TESSALON  Take 1 capsule (200 mg total) by mouth 3 (three) times daily as needed for cough.      BREO ELLIPTA IN  Inhale 1 puff into the lungs daily.     budesonide 0.25 MG/2ML nebulizer solution  Commonly known as:  PULMICORT  Take 2 mLs (0.25 mg total) by nebulization every 6 (six) hours. For wheezing/shortness of breath     doxycycline 100 MG tablet  Commonly known as:  VIBRA-TABS  Take 1  tablet (100 mg total) by mouth every 12 (twelve) hours.     DULoxetine 60 MG capsule  Commonly known as:  CYMBALTA  Take 60 mg by mouth daily.     insulin aspart 100 UNIT/ML injection  Commonly known as:  novoLOG  Inject 0-5 Units into the skin at bedtime.     LASIX 20 MG tablet  Generic drug:  furosemide  Take 20 mg by mouth daily. For swelling     loratadine 10 MG tablet  Commonly known as:  CLARITIN  Take 10 mg by mouth daily.     Melatonin 5 MG Tabs  Take 5 mg by mouth at bedtime.     multivitamin capsule  Take 1 capsule by mouth daily. For low vitamin     neomycin-polymyxin b-dexamethasone 3.5-10000-0.1 Susp  Commonly known as:  MAXITROL  Place 1 drop into both eyes 4 (four) times daily. 5 day course starting on 04/17/15     Oxycodone HCl 10 MG Tabs  Take 10 mg by mouth 2 (two) times daily as needed (for pain).     OxyCODONE 20 mg T12a 12 hr tablet  Commonly known as:  OXYCONTIN  Take 1 tablet (20 mg total) by mouth every 12 (twelve) hours.     oxyCODONE 5 MG immediate release tablet  Commonly known as:  Oxy IR/ROXICODONE  Take 2 tablets (10 mg total) by mouth every 6 (six) hours as needed. For pain.     prazosin 1 MG capsule  Commonly known as:  MINIPRESS  Take 1 mg by mouth at bedtime.     predniSONE 10 MG (21) Tbpk tablet  Commonly known as:  STERAPRED UNI-PAK 21 TAB  Take 1 tablet (10 mg total) by mouth daily. Take 5 tablets for the next 3 days, then 4 tablets x 3 days, 3 tablets x 3 days, 2 tablets for 3 days, 1 tablet x 3 days and then stop.     pregabalin 75 MG capsule  Commonly known as:  LYRICA  Take 75 mg by mouth 4 (four) times daily.      senna-docusate 8.6-50 MG tablet  Commonly known as:  Senokot-S  Take 1 tablet by mouth at bedtime as needed for mild constipation.     traMADol 50 MG tablet  Commonly known as:  ULTRAM  Take 50 mg by mouth 2 (two) times daily as needed for moderate pain or severe pain.     traZODone 100 MG tablet  Commonly known as:  DESYREL  Take 2 tablets (200 mg total) by mouth at bedtime.        Relevant Imaging Results:  Relevant Lab Results:  Recent Labs    Additional Information    Ihor Gully, LCSW  [phone]

## 2015-04-22 NOTE — Progress Notes (Addendum)
Subjective: Copd exacerbation:  Pt apparently went out yesterday afternoon, staff had to go out and find him. + increase in wheezing last nite. tx with solumedrol.  Pt still wheezing this am.  Chronic back pain:  Pt uds + for marijuana.  Pt denies use.  Angrily.  Pt states pain level 2/10 on current oxycontin and oxycodone.    Objective: Vital signs in last 24 hours: Temp:  [97.9 F (36.6 C)-98.8 F (37.1 C)] 98.5 F (36.9 C) (10/26 0424) Pulse Rate:  [79-89] 80 (10/26 0424) Resp:  [16-20] 17 (10/26 0424) BP: (115-123)/(65-79) 115/68 mmHg (10/26 0424) SpO2:  [91 %-95 %] 92 % (10/26 0424) Weight:  [98.1 kg (216 lb 4.3 oz)] 98.1 kg (216 lb 4.3 oz) (10/26 4097) Weight change: 1.484 kg (3 lb 4.3 oz) Last BM Date: 04/21/15  Intake/Output from previous day: 10/25 0701 - 10/26 0700 In: 1200 [P.O.:1200] Out: 2600 [Urine:2600] Intake/Output this shift:    Heent: anciteric Neck: no jvd Heart: rrr s1, s2 Lung: + wheezing, biltaterl lung fields Abd: soft, nt, nd, +bs Ext: no c/c/e   Lab Results:  Recent Labs  04/20/15 2045 04/22/15 0701  WBC 7.3 8.6  HGB 13.7 12.8*  HCT 40.8 39.2  PLT 179 164   BMET  Recent Labs  04/20/15 2045 04/22/15 0701  NA 139 139  K 3.7 4.0  CL 100* 101  CO2 28 32  GLUCOSE 153* 113*  BUN 11 12  CREATININE 0.85 0.92  CALCIUM 9.0 8.7*    Studies/Results: Dg Lumbar Spine 2-3 Views  04/20/2015  CLINICAL DATA:  Back pain for 5 years with no relief. Previous back surgery. EXAM: LUMBAR SPINE - 2-3 VIEW COMPARISON:  11/13/2011 FINDINGS: Again noted is bilateral pedicle screw and rod fixation at L4-L5. Stable grade 2 anterolisthesis at L4-L5. The interbody device at L4-L5 appears stable. The vertebral body heights are maintained. IMPRESSION: Postsurgical changes in the lower lumbar spine. No acute bone abnormality. Electronically Signed   By: Markus Daft M.D.   On: 04/20/2015 21:35   Mr Lumbar Spine Wo Contrast  04/21/2015  CLINICAL DATA:   59 year old male with chronic lumbar back pain, increasing with difficulty walking for 3 days. No known injury. Prior surgery. Subsequent encounter. EXAM: MRI LUMBAR SPINE WITHOUT CONTRAST TECHNIQUE: Multiplanar, multisequence MR imaging of the lumbar spine was performed. No intravenous contrast was administered. COMPARISON:  Lumbar radiographs 04/20/2015. [facility] Lumbar MRI 11/13/2011. Chest CT 02/07/2007. FINDINGS: Twelve full size ribs demonstrated on the 2008 chest CT. Therefore the S1 level is lumbarized. The fusion hardware is at the L5-S1 level. This numbering system differs from that in 2013. Posterior and interbody fusion hardware at L5-S1. Stable grade 1 anterolisthesis at that level. Chronic mild retrolisthesis at L1-L2. Chronic but increased disc desiccation at that level. Stable vertebral height and alignment elsewhere. Trace anterior endplate marrow edema at L1-L2. No other acute osseous abnormality identified. Visualized lower thoracic spinal cord is normal with conus medularis at L1. Postoperative changes to the posterior paraspinal soft tissues. Negative visualized abdominal viscera. Partially visible mild bladder wall thickening. T12-L1:  Negative. L1-L2: Chronic mild retrolisthesis and disc desiccation. Mild left eccentric/ far lateral disc bulging. No stenosis. L2-L3:  Mild facet hypertrophy. L3-L4: Mild disc desiccation. Moderate facet hypertrophy. No stenosis. L4-L5: Chronic disc desiccation with left paracentral annular fissure. Moderate facet and mild ligament flavum hypertrophy have not significantly changed. No spinal or lateral recess stenosis. Borderline to mild bilateral L4 foraminal stenosis is stable. L5-S1: Sequelae  of decompression and fusion. Stable thecal sac and foraminal patency. S1-S2: Lumbarized S1 level. Full size disc with normal signal and morphology. Mild epidural lipomatosis is stable. Mild facet hypertrophy greater on the right. No stenosis.  IMPRESSION: 1. 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 Previous decompression and fusion is at the L5-S1 level, this numbering system differs from that in 2013. 2. Stable postoperative appearance of L5-S1. Mild adjacent segment disease at L4-L5 with mild foraminal stenosis is stable. 3. Chronic but increased disc desiccation at L1-L2 without stenosis. Stable mild disc and moderate facet degeneration elsewhere. Electronically Signed   By: Genevie Ann M.D.   On: 04/21/2015 08:14    Medications: I have reviewed the patient's current medications.  Assessment/Plan: Chronic back pain Cont oxycontin, cont oxycodone D/c morphine iv  Copd exacerbation Pt received iv solumedrol   Decrease iv solumedrol to 40mg  iv q8 Cont levaquin iv Cont current breathing treatments CXR today.   Anxiety Pt states that cymbalta => tremor ? D/c cymbalta prozac 40mg  po qday  DVT prophylaxis: lovenox   LOS: 2 days   Jani Gravel 04/22/2015, 8:06 AM

## 2015-04-23 LAB — HEMOGLOBIN A1C
Hgb A1c MFr Bld: 5.7 % — ABNORMAL HIGH (ref 4.8–5.6)
Mean Plasma Glucose: 117 mg/dL

## 2015-04-23 MED ORDER — NICOTINE 21 MG/24HR TD PT24
21.0000 mg | MEDICATED_PATCH | Freq: Every day | TRANSDERMAL | Status: DC
  Administered 2015-04-23 – 2015-04-25 (×3): 21 mg via TRANSDERMAL
  Filled 2015-04-23 (×4): qty 1

## 2015-04-23 NOTE — Progress Notes (Signed)
Patient is unable to do [facility] inhaler effectively. Unable to take a deep breath upon command.

## 2015-04-23 NOTE — Clinical Social Work Note (Signed)
CSW met with patient who advised that he had been contacting facilities along with this [place] Social Worker, Ms. Ronnald Ramp.  He advised that Ms. Jones had to get the housing issue worked out due to ensuring that he had enough money for the placement.  He indicated that if Ms. Ronnald Ramp did not find him a new placement by discharge, he was willing to go back to [facility] while Ms. Jones continued to work on the new placement.   Ihor Gully, [place] [phone]

## 2015-04-23 NOTE — Progress Notes (Signed)
PT Cancellation Note  Patient Details Name: Steven CASTILLE Sr. MRN: [id number] DOB: 1956/04/10   Cancelled Treatment:    Reason Eval/Treat Not Completed: Other (comment).  Pt was found outside walking all around the hospital with his walker yesterday per Steven Bamberg, RN.  Since he is back at his functional baseline we will d/c him from service.   Steven Wiley  PT 04/23/2015, 9:01 AM [phone]

## 2015-04-23 NOTE — Clinical Social Work Note (Signed)
CSW met with patient and provided him a SNF list as patient's PCP indicated that patient may need SNF due to COPD. CSW discussed this with patient. Patient indicated that he was unsure as to whether he wanted to go to a SNF as his PCP said that his wheezing may be reduced in a day or two.  Patient stated that he would began calling SNF's to see who had availability.  CSW advised patient that he did not have to call the facilities because CSW could send out the information electronically to facilities that he was interested in.    CSW returned to patient's room and found patient on the phone calling facilities.  Patient requested that CSW return later as he was on the phone.   CSW spoke with patient again and advised that he wanted to contact facilities himself to ensure that the facilities could handle him and his needs.  He stated that he also wanted to make sure that the facilities would be in communication with his doctors.  Patient advised that he felt more comfortable doing it this way.  He advised that if he found a place he would let CSW know to send his information to that facility.  He advised that if he did not locate a facility, he was comfortable coming back to [facility] and continuing to work on his transition with his DSS Education officer, museum, Ms. Ronnald Ramp.    Ihor Gully, [place] [phone]

## 2015-04-23 NOTE — Progress Notes (Signed)
Patient requesting eye drops (Maxitrol) that he was taking prior to admission.  Dr. Maudie Mercury notified and stated that he will place order for them.

## 2015-04-24 LAB — COMPREHENSIVE METABOLIC PANEL
ALK PHOS: 85 U/L (ref 38–126)
ALT: 17 U/L (ref 17–63)
AST: 21 U/L (ref 15–41)
Albumin: 3.7 g/dL (ref 3.5–5.0)
Anion gap: 8 (ref 5–15)
BUN: 20 mg/dL (ref 6–20)
CALCIUM: 8.8 mg/dL — AB (ref 8.9–10.3)
CO2: 31 mmol/L (ref 22–32)
CREATININE: 0.92 mg/dL (ref 0.61–1.24)
Chloride: 99 mmol/L — ABNORMAL LOW (ref 101–111)
Glucose, Bld: 237 mg/dL — ABNORMAL HIGH (ref 65–99)
Potassium: 4 mmol/L (ref 3.5–5.1)
Sodium: 138 mmol/L (ref 135–145)
Total Bilirubin: 0.4 mg/dL (ref 0.3–1.2)
Total Protein: 6.8 g/dL (ref 6.5–8.1)

## 2015-04-24 LAB — CBC
HEMATOCRIT: 43.6 % (ref 39.0–52.0)
HEMOGLOBIN: 13.9 g/dL (ref 13.0–17.0)
MCH: 29.8 pg (ref 26.0–34.0)
MCHC: 31.9 g/dL (ref 30.0–36.0)
MCV: 93.4 fL (ref 78.0–100.0)
Platelets: 171 10*3/uL (ref 150–400)
RBC: 4.67 MIL/uL (ref 4.22–5.81)
RDW: 14.2 % (ref 11.5–15.5)
WBC: 11.8 10*3/uL — AB (ref 4.0–10.5)

## 2015-04-24 MED ORDER — ONDANSETRON HCL 4 MG/2ML IJ SOLN: 4.0000 mg | Freq: Four times a day (QID) | INTRAMUSCULAR | Status: DC | PRN

## 2015-04-24 MED ORDER — GABAPENTIN 300 MG PO CAPS
600.0000 mg | ORAL_CAPSULE | Freq: Four times a day (QID) | ORAL | Status: DC
Start: 2015-04-24 — End: 2015-07-21

## 2015-04-24 MED ORDER — POLYETHYLENE GLYCOL 3350 17 G PO PACK: 17.0000 g | PACK | Freq: Every day | ORAL | Status: DC | PRN

## 2015-04-24 MED ORDER — PREDNISONE 20 MG PO TABS
60.0000 mg | ORAL_TABLET | Freq: Every day | ORAL | Status: DC
  Administered 2015-04-24 – 2015-04-25 (×2): 60 mg via ORAL
  Filled 2015-04-24 (×3): qty 3

## 2015-04-24 MED ORDER — ACLIDINIUM BROMIDE 400 MCG/ACT IN AEPB: 1.0000 | INHALATION_SPRAY | Freq: Two times a day (BID) | RESPIRATORY_TRACT | Status: DC

## 2015-04-24 MED ORDER — LIDOCAINE 5 % EX PTCH: 1.0000 | MEDICATED_PATCH | CUTANEOUS | Status: DC

## 2015-04-24 MED ORDER — NYSTATIN 100000 UNIT/ML MT SUSP
5.0000 mL | Freq: Four times a day (QID) | OROMUCOSAL | Status: DC
  Administered 2015-04-24 – 2015-04-25 (×2): 500000 [IU] via ORAL
  Filled 2015-04-24 (×2): qty 5

## 2015-04-24 MED ORDER — LEVOFLOXACIN 500 MG PO TABS: 500.0000 mg | ORAL_TABLET | Freq: Every day | ORAL | Status: DC

## 2015-04-24 MED ORDER — OXYCODONE HCL 10 MG PO TABS: 10.0000 mg | ORAL_TABLET | Freq: Four times a day (QID) | ORAL | Status: DC | PRN

## 2015-04-24 MED ORDER — LEVOFLOXACIN 500 MG PO TABS
500.0000 mg | ORAL_TABLET | Freq: Every day | ORAL | Status: DC
  Administered 2015-04-24 – 2015-04-25 (×2): 500 mg via ORAL
  Filled 2015-04-24 (×3): qty 1

## 2015-04-24 MED ORDER — FLUOXETINE HCL 40 MG PO CAPS: 40.0000 mg | ORAL_CAPSULE | Freq: Every day | ORAL | Status: DC

## 2015-04-24 MED ORDER — LORATADINE 10 MG PO TABS: 10.0000 mg | ORAL_TABLET | Freq: Every day | ORAL | Status: DC

## 2015-04-24 MED ORDER — PREDNISONE 10 MG PO TABS
ORAL_TABLET | ORAL | Status: DC
Start: 2015-04-24 — End: 2015-06-30

## 2015-04-24 MED ORDER — NICOTINE 21 MG/24HR TD PT24: 21.0000 mg | MEDICATED_PATCH | Freq: Every day | TRANSDERMAL | Status: DC

## 2015-04-24 MED ORDER — NEOMYCIN-POLYMYXIN-DEXAMETH 3.5-10000-0.1 OP SUSP
1.0000 [drp] | Freq: Four times a day (QID) | OPHTHALMIC | Status: DC
  Administered 2015-04-24 – 2015-04-25 (×5): 1 [drp] via OPHTHALMIC
  Filled 2015-04-24 (×2): qty 5

## 2015-04-24 MED ORDER — BENZONATATE 200 MG PO CAPS: 200.0000 mg | ORAL_CAPSULE | Freq: Three times a day (TID) | ORAL | Status: DC | PRN

## 2015-04-24 NOTE — Care Management Note (Signed)
Case Management Note  Patient Details  Name: ROYALE SWAMY Sr. MRN: [id number] Date of Birth: [birth year]  Subjective/Objective:                    Action/Plan:   Expected Discharge Date:                  Expected Discharge Plan:  Assisted Living / Rest Home  In-House Referral:  Clinical Social Work  Discharge planning Services  CM Consult  Post Acute Care Choice:  NA Choice offered to:  NA  DME Arranged:    DME Agency:     HH Arranged:    [place] Agency:     Status of Service:  Completed, signed off  Medicare Important Message Given:    Date Medicare IM Given:    Medicare IM give by:    Date Additional Medicare IM Given:    Additional Medicare Important Message give by:     If discussed at [place] of Stay Meetings, dates discussed:    Additional Comments: Pt discharging back to [facility] today. CSW to arrange discharge to facility. Christinia Gully [place], RN 04/24/2015, 3:25 PM

## 2015-04-24 NOTE — NC FL2 (Signed)
[place] LEVEL OF CARE SCREENING TOOL     IDENTIFICATION  Patient Name: Steven STEPHENS Sr. Birthdate: [birth year] Sex: male Admission Date (Current Location): 04/20/2015  [facility] and [place] Number:  [facility] [facility]) [phone] [place] and Address:  [facility] [address] [place]      Provider Number: [id number]  Attending Physician Name and Address:  Jani Gravel, MD  Relative Name and Phone Number:       Current Level of Care: Hospital Recommended Level of Care: [place] Prior Approval Number:    Date Approved/Denied:   PASRR Number: [id number]  Discharge Plan: Other (Comment)    Current Diagnoses: Patient Active Problem List   Diagnosis Date Noted  . Intractable back pain 04/20/2015  . Acute on chronic respiratory failure with hypoxemia ([place]) 01/03/2015  . Alcohol dependence with uncomplicated withdrawal ([place])   . Major depressive disorder, recurrent, severe without psychotic features ([place])   . Uncomplicated alcohol dependence ([place])   . MDD (major depressive disorder), recurrent episode, severe ([place]) 06/24/2014  . Alcohol abuse 06/24/2014  . Alcohol dependence ([place]) 06/24/2014  . Alcohol dependence with withdrawal, uncomplicated ([place]) 93/57/0177  . Major depression, single episode 06/23/2014  . Suicidal ideations 06/23/2014  . Laryngeal pain 04/30/2014  . Neurosis, posttraumatic 02/24/2014  . Back ache 02/22/2014  . Chronic depression 02/22/2014  . Personal history of traumatic fracture 02/22/2014  . Coughing 01/23/2013  . COPD (chronic obstructive pulmonary disease) ([place]) 11/13/2012  . Abnormal CXR 11/13/2012  . Drug-seeking behavior 11/13/2012  . Odynophagia 10/31/2012  . Oral thrush 10/31/2012  . Acute-on-chronic respiratory failure ([place]) 10/29/2012  . COPD exacerbation ([place]) 10/28/2012  . Adrenal insufficiency ([place]) 10/28/2012  . Acute adrenal crisis ([place]) 10/28/2012  . Pituitary adenoma ([place])  10/28/2012  . Anxiety disorder 10/28/2012    Orientation ACTIVITIES/SOCIAL BLADDER RESPIRATION         Continent Normal  BEHAVIORAL SYMPTOMS/MOOD NEUROLOGICAL BOWEL NUTRITION STATUS      Continent  (Regular)  PHYSICIAN VISITS COMMUNICATION OF NEEDS Height & Weight Skin    Verbally   216 lbs. Normal          AMBULATORY STATUS RESPIRATION    Assist independent (uses walker) Normal      Personal Care Assistance Level of Assistance               Functional Limitations Info                SPECIAL CARE FACTORS FREQUENCY  PT (By licensed PT) (minimum 2x/week)     PT Frequency:  (minimum 2x/per week)             Additional Factors Info  Code Status, Allergies (Full Code.) Code Status Info:  (Full Code) Allergies Info:  (Spiriva (Tiotropium Bromide Monohydrate), Acetaminophen, Hydrocodone-acetaminophen, Penicillins, Advair Diskus (Fluticasone-Salmeterol))           Current Medications (04/24/2015): Current Facility-Administered Medications  Medication Dose Route Frequency Provider Last Rate Last Dose  . 0.9 %  sodium chloride infusion  250 mL Intravenous PRN Jani Gravel, MD      . albuterol (PROVENTIL) (2.5 MG/3ML) 0.083% nebulizer solution 3 mL  3 mL Inhalation Q6H WA Jani Gravel, MD   3 mL at 04/24/15 1314  . arformoterol (BROVANA) nebulizer solution 15 mcg  15 mcg Nebulization BID Jani Gravel, MD   15 mcg at 04/24/15 1314  . benzonatate (TESSALON) capsule 200 mg  200 mg Oral TID PRN Jani Gravel, MD      .  enoxaparin (LOVENOX) injection 40 mg  40 mg Subcutaneous Q24H Jani Gravel, MD   40 mg at 04/23/15 2202  . FLUoxetine (PROZAC) capsule 40 mg  40 mg Oral Daily Jani Gravel, MD   40 mg at 04/24/15 1104  . furosemide (LASIX) tablet 20 mg  20 mg Oral Daily Jani Gravel, MD   20 mg at 04/24/15 1106  . gabapentin (NEURONTIN) capsule 600 mg  600 mg Oral QID Jani Gravel, MD   600 mg at 04/24/15 1457  . levofloxacin (LEVAQUIN) tablet 500 mg  500 mg Oral Daily Jani Gravel, MD   500 mg  at 04/24/15 1105  . lidocaine (LIDODERM) 5 % 1 patch  1 patch Transdermal Q24H Jani Gravel, MD   1 patch at 04/24/15 1106  . loratadine (CLARITIN) tablet 10 mg  10 mg Oral Daily Jani Gravel, MD   10 mg at 04/24/15 1105  . multivitamin with minerals tablet 1 tablet  1 tablet Oral Daily Jani Gravel, MD   1 tablet at 04/24/15 1104  . neomycin-polymyxin b-dexamethasone (MAXITROL) ophthalmic suspension 1 drop  1 drop Both Eyes 4 times per day Jani Gravel, MD   1 drop at 04/24/15 1200  . nicotine (NICODERM CQ - dosed in mg/24 hours) patch 21 mg  21 mg Transdermal Daily Jani Gravel, MD   21 mg at 04/24/15 1103  . ondansetron (ZOFRAN) injection 4 mg  4 mg Intravenous Q6H PRN Jani Gravel, MD   4 mg at 04/23/15 1453  . oxyCODONE (Oxy IR/ROXICODONE) immediate release tablet 10 mg  10 mg Oral Q6H PRN Jani Gravel, MD   10 mg at 04/24/15 1457  . OxyCODONE (OXYCONTIN) 12 hr tablet 20 mg  20 mg Oral Q12H Jani Gravel, MD   20 mg at 04/24/15 1104  . polyethylene glycol (MIRALAX / GLYCOLAX) packet 17 g  17 g Oral Daily PRN Jani Gravel, MD   17 g at 04/23/15 1247  . prazosin (MINIPRESS) capsule 1 mg  1 mg Oral QHS Jani Gravel, MD   1 mg at 04/23/15 2202  . predniSONE (DELTASONE) tablet 60 mg  60 mg Oral Q breakfast Jani Gravel, MD   60 mg at 04/24/15 0842  . senna-docusate (Senokot-S) tablet 1 tablet  1 tablet Oral QHS PRN Jani Gravel, MD   1 tablet at 04/23/15 2206  . sodium chloride 0.9 % injection 3 mL  3 mL Intravenous Q12H Jani Gravel, MD   3 mL at 04/24/15 1000  . sodium chloride 0.9 % injection 3 mL  3 mL Intravenous PRN Jani Gravel, MD      . traZODone (DESYREL) tablet 200 mg  200 mg Oral QHS Jani Gravel, MD   200 mg at 04/23/15 2201   Do not use this list as official medication orders. Please verify with discharge summary.  Discharge Medications:   Medication List    STOP taking these medications        arformoterol 15 MCG/2ML Nebu  Commonly known as:  BROVANA     budesonide 0.25 MG/2ML nebulizer solution  Commonly known as:   PULMICORT     doxycycline 100 MG tablet  Commonly known as:  VIBRA-TABS     DULoxetine 60 MG capsule  Commonly known as:  CYMBALTA     insulin aspart 100 UNIT/ML injection  Commonly known as:  novoLOG     LASIX 20 MG tablet  Generic drug:  furosemide     predniSONE 10 MG (21) Tbpk tablet  Commonly known as:  STERAPRED UNI-PAK 21 TAB  Replaced by:  predniSONE 10 MG tablet     pregabalin 75 MG capsule  Commonly known as:  LYRICA     traMADol 50 MG tablet  Commonly known as:  ULTRAM      TAKE these medications        Aclidinium Bromide 400 MCG/ACT Aepb  Commonly known as:  TUDORZA PRESSAIR  Inhale 1 puff into the lungs 2 (two) times daily.     albuterol 108 (90 BASE) MCG/ACT inhaler  Commonly known as:  PROVENTIL HFA;VENTOLIN HFA  Inhale 2 puffs into the lungs every 6 (six) hours as needed for wheezing or shortness of breath.     benzonatate 200 MG capsule  Commonly known as:  TESSALON  Take 1 capsule (200 mg total) by mouth 3 (three) times daily as needed for cough.     BREO ELLIPTA IN  Inhale 1 puff into the lungs daily.     FLUoxetine 40 MG capsule  Commonly known as:  PROZAC  Take 1 capsule (40 mg total) by mouth daily.     gabapentin 300 MG capsule  Commonly known as:  NEURONTIN  Take 2 capsules (600 mg total) by mouth 4 (four) times daily.     levofloxacin 500 MG tablet  Commonly known as:  LEVAQUIN  Take 1 tablet (500 mg total) by mouth daily.     lidocaine 5 %  Commonly known as:  LIDODERM  Place 1 patch onto the skin daily. Remove & Discard patch within 12 hours or as directed by MD     loratadine 10 MG tablet  Commonly known as:  CLARITIN  Take 10 mg by mouth daily.     loratadine 10 MG tablet  Commonly known as:  CLARITIN  Take 1 tablet (10 mg total) by mouth daily.     Melatonin 5 MG Tabs  Take 5 mg by mouth at bedtime.     multivitamin capsule  Take 1 capsule by mouth daily. For low vitamin     neomycin-polymyxin b-dexamethasone  3.5-10000-0.1 Susp  Commonly known as:  MAXITROL  Place 1 drop into both eyes 4 (four) times daily. 5 day course starting on 04/17/15     nicotine 21 mg/24hr patch  Commonly known as:  NICODERM CQ - dosed in mg/24 hours  Place 1 patch (21 mg total) onto the skin daily.     ondansetron 4 MG/2ML Soln injection  Commonly known as:  ZOFRAN  Inject 2 mLs (4 mg total) into the vein every 6 (six) hours as needed for nausea or vomiting.     oxyCODONE 20 mg 12 hr tablet  Commonly known as:  OXYCONTIN  Take 1 tablet (20 mg total) by mouth every 12 (twelve) hours.     Oxycodone HCl 10 MG Tabs  Take 1 tablet (10 mg total) by mouth every 6 (six) hours as needed for severe pain or breakthrough pain (for pain).     polyethylene glycol packet  Commonly known as:  MIRALAX / GLYCOLAX  Take 17 g by mouth daily as needed for mild constipation.     prazosin 1 MG capsule  Commonly known as:  MINIPRESS  Take 1 mg by mouth at bedtime.     predniSONE 10 MG tablet  Commonly known as:  DELTASONE  60mg  po qday x 2 days then 50mg  po qday x 2 days then 40mg  po qday x 2 days then 30mg  po qday x 2 days then 20mg  po qday  x 2 days then 10mg  po qday x 2 days     senna-docusate 8.6-50 MG tablet  Commonly known as:  Senokot-S  Take 1 tablet by mouth at bedtime as needed for mild constipation.     traZODone 100 MG tablet  Commonly known as:  DESYREL  Take 2 tablets (200 mg total) by mouth at bedtime.        Relevant Imaging Results:  Relevant Lab Results:  Recent Labs    Additional Information    Torry Adamczak, Clydene Pugh, LCSW

## 2015-04-24 NOTE — Progress Notes (Signed)
Pt is complaining that the breathing treatments has given him thrush and is requesting some nystatin solution. Paged Dr.Callahan. No new orders at this time. Will continue to monitor

## 2015-04-24 NOTE — Progress Notes (Signed)
Subjective: Copd exacerbation: still wheezing, currently on iv solumetrol. Slight dry cough.  Denies fever, chills, cp, palp,  BAck pain: pain level 4/10,  We changed from lyrica to gabapentin and patient thinks that this has helped with pain.   Anxiety:  Off cymbalta,  Using prozac.  Stable.     Objective: Vital signs in last 24 hours: Temp:  [97.8 F (36.6 C)-98.4 F (36.9 C)] 98.4 F (36.9 C) (10/27 2036) Pulse Rate:  [76-81] 81 (10/27 2036) Resp:  [20-22] 20 (10/27 2036) BP: (117)/(66-67) 117/66 mmHg (10/27 2036) SpO2:  [90 %-95 %] 93 % (10/27 2036) Weight:  [99.111 kg (218 lb 8 oz)] 99.111 kg (218 lb 8 oz) (10/27 0655) Weight change:  Last BM Date: 04/21/15  Intake/Output from previous day: 10/27 0701 - 10/28 0700 In: 720 [P.O.:720] Out: 1200 [Urine:1200] Intake/Output this shift:    Heent: anicteric Neck: no jvd Heart: rrr s1, s2 Lung: +bil wheezing, no crackles,  ABd: soft, nt Ext: no cc/c/e  Lab Results:  Recent Labs  04/22/15 0701  WBC 8.6  HGB 12.8*  HCT 39.2  PLT 164   BMET  Recent Labs  04/22/15 0701  NA 139  K 4.0  CL 101  CO2 32  GLUCOSE 113*  BUN 12  CREATININE 0.92  CALCIUM 8.7*    Studies/Results: Dg Chest 2 View  04/22/2015  CLINICAL DATA:  Productive cough, shortness of Breath EXAM: CHEST  2 VIEW COMPARISON:  01/03/2015 FINDINGS: Cardiomediastinal silhouette is stable. Again noted chronic interstitial prominence. No segmental infiltrate or pulmonary edema. Stable right apical pleural parenchymal scarring. IMPRESSION: No active disease.  Stable chronic findings as described above. Electronically Signed   By: Lahoma Crocker M.D.   On: 04/22/2015 09:07    Medications: I have reviewed the patient's current medications.  Assessment/Plan: Copd exacerbation Cont iv solumedro Cont iv levaquin Will try to change over to po tomorrow  Back pain Cont gabapentin, cont oxycontin 20mg  po bid, and oxycodone 10mg  po q6h prn breakthru  pain  Anxiety/depression Cont prozac   DVT prophylaxisL scd  LOS: 4 days   Jani Gravel 04/24/2015, 12:00 AM

## 2015-04-24 NOTE — NC FL2 (Deleted)
[place] LEVEL OF CARE SCREENING TOOL     IDENTIFICATION  Patient Name: Steven BECKEL Sr. Birthdate: 07-01-1955 Sex: male Admission Date (Current Location): 04/20/2015  [facility] and [place] Number:  [facility] [facility]) [phone] [place] and Address:  [facility] [address] [place]      Provider Number: [id number]  Attending Physician Name and Address:  Jani Gravel, MD  Relative Name and Phone Number:       Current Level of Care: Hospital Recommended Level of Care: [place] Prior Approval Number:    Date Approved/Denied:   PASRR Number: [id number]  Discharge Plan: Other (Comment)    Current Diagnoses: Patient Active Problem List   Diagnosis Date Noted  . Intractable back pain 04/20/2015  . Acute on chronic respiratory failure with hypoxemia ([place]) 01/03/2015  . Alcohol dependence with uncomplicated withdrawal ([place])   . Major depressive disorder, recurrent, severe without psychotic features ([place])   . Uncomplicated alcohol dependence ([place])   . MDD (major depressive disorder), recurrent episode, severe ([place]) 06/24/2014  . Alcohol abuse 06/24/2014  . Alcohol dependence ([place]) 06/24/2014  . Alcohol dependence with withdrawal, uncomplicated ([place]) 25/85/2778  . Major depression, single episode 06/23/2014  . Suicidal ideations 06/23/2014  . Laryngeal pain 04/30/2014  . Neurosis, posttraumatic 02/24/2014  . Back ache 02/22/2014  . Chronic depression 02/22/2014  . Personal history of traumatic fracture 02/22/2014  . Coughing 01/23/2013  . COPD (chronic obstructive pulmonary disease) ([place]) 11/13/2012  . Abnormal CXR 11/13/2012  . Drug-seeking behavior 11/13/2012  . Odynophagia 10/31/2012  . Oral thrush 10/31/2012  . Acute-on-chronic respiratory failure ([place]) 10/29/2012  . COPD exacerbation ([place]) 10/28/2012  . Adrenal insufficiency ([place]) 10/28/2012  . Acute adrenal crisis ([place]) 10/28/2012  . Pituitary adenoma ([place])  10/28/2012  . Anxiety disorder 10/28/2012    Orientation ACTIVITIES/SOCIAL BLADDER RESPIRATION         Continent Normal  BEHAVIORAL SYMPTOMS/MOOD NEUROLOGICAL BOWEL NUTRITION STATUS      Continent  (Regular)  PHYSICIAN VISITS COMMUNICATION OF NEEDS Height & Weight Skin    Verbally   216 lbs. Normal          AMBULATORY STATUS RESPIRATION    Assist independent (uses walker) Normal      Personal Care Assistance Level of Assistance               Functional Limitations Info                SPECIAL CARE FACTORS FREQUENCY  PT (By licensed PT) (minimum 2x/week)     PT Frequency:  (minimum 2x/per week)             Additional Factors Info  Code Status, Allergies (Full Code.) Code Status Info:  (Full Code) Allergies Info:  (Spiriva (Tiotropium Bromide Monohydrate), Acetaminophen, Hydrocodone-acetaminophen, Penicillins, Advair Diskus (Fluticasone-Salmeterol))           Current Medications (04/24/2015): Current Facility-Administered Medications  Medication Dose Route Frequency Provider Last Rate Last Dose  . 0.9 %  sodium chloride infusion  250 mL Intravenous PRN Jani Gravel, MD      . albuterol (PROVENTIL) (2.5 MG/3ML) 0.083% nebulizer solution 3 mL  3 mL Inhalation Q6H WA Jani Gravel, MD   3 mL at 04/24/15 1314  . arformoterol (BROVANA) nebulizer solution 15 mcg  15 mcg Nebulization BID Jani Gravel, MD   15 mcg at 04/24/15 1314  . benzonatate (TESSALON) capsule 200 mg  200 mg Oral TID PRN Jani Gravel, MD      .  enoxaparin (LOVENOX) injection 40 mg  40 mg Subcutaneous Q24H Jani Gravel, MD   40 mg at 04/23/15 2202  . FLUoxetine (PROZAC) capsule 40 mg  40 mg Oral Daily Jani Gravel, MD   40 mg at 04/24/15 1104  . furosemide (LASIX) tablet 20 mg  20 mg Oral Daily Jani Gravel, MD   20 mg at 04/24/15 1106  . gabapentin (NEURONTIN) capsule 600 mg  600 mg Oral QID Jani Gravel, MD   600 mg at 04/24/15 1105  . levofloxacin (LEVAQUIN) tablet 500 mg  500 mg Oral Daily Jani Gravel, MD   500 mg  at 04/24/15 1105  . lidocaine (LIDODERM) 5 % 1 patch  1 patch Transdermal Q24H Jani Gravel, MD   1 patch at 04/24/15 1106  . loratadine (CLARITIN) tablet 10 mg  10 mg Oral Daily Jani Gravel, MD   10 mg at 04/24/15 1105  . multivitamin with minerals tablet 1 tablet  1 tablet Oral Daily Jani Gravel, MD   1 tablet at 04/24/15 1104  . neomycin-polymyxin b-dexamethasone (MAXITROL) ophthalmic suspension 1 drop  1 drop Both Eyes 4 times per day Jani Gravel, MD   1 drop at 04/24/15 0730  . nicotine (NICODERM CQ - dosed in mg/24 hours) patch 21 mg  21 mg Transdermal Daily Jani Gravel, MD   21 mg at 04/24/15 1103  . ondansetron (ZOFRAN) injection 4 mg  4 mg Intravenous Q6H PRN Jani Gravel, MD   4 mg at 04/23/15 1453  . oxyCODONE (Oxy IR/ROXICODONE) immediate release tablet 10 mg  10 mg Oral Q6H PRN Jani Gravel, MD   10 mg at 04/24/15 0841  . OxyCODONE (OXYCONTIN) 12 hr tablet 20 mg  20 mg Oral Q12H Jani Gravel, MD   20 mg at 04/24/15 1104  . polyethylene glycol (MIRALAX / GLYCOLAX) packet 17 g  17 g Oral Daily PRN Jani Gravel, MD   17 g at 04/23/15 1247  . prazosin (MINIPRESS) capsule 1 mg  1 mg Oral QHS Jani Gravel, MD   1 mg at 04/23/15 2202  . predniSONE (DELTASONE) tablet 60 mg  60 mg Oral Q breakfast Jani Gravel, MD   60 mg at 04/24/15 0842  . senna-docusate (Senokot-S) tablet 1 tablet  1 tablet Oral QHS PRN Jani Gravel, MD   1 tablet at 04/23/15 2206  . sodium chloride 0.9 % injection 3 mL  3 mL Intravenous Q12H Jani Gravel, MD   3 mL at 04/24/15 1000  . sodium chloride 0.9 % injection 3 mL  3 mL Intravenous PRN Jani Gravel, MD      . traZODone (DESYREL) tablet 200 mg  200 mg Oral QHS Jani Gravel, MD   200 mg at 04/23/15 2201   Do not use this list as official medication orders. Please verify with discharge summary.  Discharge Medications:   Medication List    ASK your doctor about these medications        albuterol 108 (90 BASE) MCG/ACT inhaler  Commonly known as:  PROVENTIL HFA;VENTOLIN HFA  Inhale 2 puffs into the lungs  every 6 (six) hours as needed for wheezing or shortness of breath.     arformoterol 15 MCG/2ML Nebu  Commonly known as:  BROVANA  Take 2 mLs (15 mcg total) by nebulization every 12 (twelve) hours.     benzonatate 200 MG capsule  Commonly known as:  TESSALON  Take 1 capsule (200 mg total) by mouth 3 (three) times daily as needed for cough.  BREO ELLIPTA IN  Inhale 1 puff into the lungs daily.     budesonide 0.25 MG/2ML nebulizer solution  Commonly known as:  PULMICORT  Take 2 mLs (0.25 mg total) by nebulization every 6 (six) hours. For wheezing/shortness of breath     doxycycline 100 MG tablet  Commonly known as:  VIBRA-TABS  Take 1 tablet (100 mg total) by mouth every 12 (twelve) hours.     DULoxetine 60 MG capsule  Commonly known as:  CYMBALTA  Take 60 mg by mouth daily.     insulin aspart 100 UNIT/ML injection  Commonly known as:  novoLOG  Inject 0-5 Units into the skin at bedtime.     LASIX 20 MG tablet  Generic drug:  furosemide  Take 20 mg by mouth daily. For swelling     loratadine 10 MG tablet  Commonly known as:  CLARITIN  Take 10 mg by mouth daily.     Melatonin 5 MG Tabs  Take 5 mg by mouth at bedtime.     multivitamin capsule  Take 1 capsule by mouth daily. For low vitamin     neomycin-polymyxin b-dexamethasone 3.5-10000-0.1 Susp  Commonly known as:  MAXITROL  Place 1 drop into both eyes 4 (four) times daily. 5 day course starting on 04/17/15     Oxycodone HCl 10 MG Tabs  Take 10 mg by mouth 2 (two) times daily as needed (for pain).     oxyCODONE 20 mg 12 hr tablet  Commonly known as:  OXYCONTIN  Take 1 tablet (20 mg total) by mouth every 12 (twelve) hours.     oxyCODONE 5 MG immediate release tablet  Commonly known as:  Oxy IR/ROXICODONE  Take 2 tablets (10 mg total) by mouth every 6 (six) hours as needed. For pain.     prazosin 1 MG capsule  Commonly known as:  MINIPRESS  Take 1 mg by mouth at bedtime.     predniSONE 10 MG (21) Tbpk tablet   Commonly known as:  STERAPRED UNI-PAK 21 TAB  Take 1 tablet (10 mg total) by mouth daily. Take 5 tablets for the next 3 days, then 4 tablets x 3 days, 3 tablets x 3 days, 2 tablets for 3 days, 1 tablet x 3 days and then stop.     pregabalin 75 MG capsule  Commonly known as:  LYRICA  Take 75 mg by mouth 4 (four) times daily.     senna-docusate 8.6-50 MG tablet  Commonly known as:  Senokot-S  Take 1 tablet by mouth at bedtime as needed for mild constipation.     traMADol 50 MG tablet  Commonly known as:  ULTRAM  Take 50 mg by mouth 2 (two) times daily as needed for moderate pain or severe pain.     traZODone 100 MG tablet  Commonly known as:  DESYREL  Take 2 tablets (200 mg total) by mouth at bedtime.        Relevant Imaging Results:  Relevant Lab Results:  Recent Labs    Additional Information    Steven Wiley, Steven Pugh, LCSW

## 2015-04-24 NOTE — Clinical Social Work Note (Addendum)
CSW spoke with Dr. Maudie Mercury, who advised that patient may be appropriate for SNF. CSW advised Dr. Maudie Mercury that patient had not provided the names of any SNF for CSW to send his information to. CSW advised that patient's information could not be sent without his permission.  Dr. Maudie Mercury agreed to meet CSW in patient's room at 1:15 p.m.  CSW met with patient and patient provided the names of the following facilities: [facility] in [place], [facility], [facility] [place], [facility] and [facility]. Patient advised that he had contacted all of these facilities and that they accepted his Medicaid and had availability. CSW called each facility and was advised by four of the facilities that that they did not accept Medicaid.  [facility] did not have any beds.    CSW met with patient, Dr. Maudie Mercury was at bedside. Upon entering the room, CSW found patient reviewing his SNF and ALF list.  CSW advised patient of the previous bed responses the the facilities given.  Patient stated that the places previously told him he could come.  Attending discussed SNF as an option. Patient began to review SNF list.  CSW discussed Ms. Ronnald Ramp role again with patient. Patient provided verbal permission to contact DSS regarding their involvement with him related to placement.  CSW contact Ms. Jones and left a voicemail. CSW spoke with Mikki Santee, (supervisor to Ms. Ronnald Ramp) at [place].  Mikki Santee advised that patient had not had an open case in APS since April.  Patient stated that he has been speaking with Ms. Ronnald Ramp since being in the hospital.  Mikki Santee provided CSW and patient wit the names of four facilities in [facility] that frequently take Medicaid.  CSW reviewed the facilities with patient. Patient was agreeable for his clinicals to be faxed to [facility], [place], [place].  CSW did not receive bed offers from SNF sent.    Dr. Maudie Mercury had patient's clinicals sent to [facility]. CSW spoke with Estill Bamberg who  indicated that they would not be able to make a bed offer.  CSW spoke with Aram Beecham at [facility] advised that she was not aware that patient would be returning.  She indicated that patient wrote a statement indicating that he would not be returning.  CSW advised that on 10/25, Kim at [facility] had indicated that patient could come back to the facility at discharge. Aram Beecham indicated that this had not been discussed with her.  She indicated that at this time she would be unable to obtain patient's medications that were new and/or changed from the pharmacy.   CSW spoke with patient who advised that [facility] was giving him a hard time about returning due to the [facility] coming in two weeks ago and him voicing his concerns.   He indicated that he did not write a letter stating that he would not come back. He stated that Trudy asked him to write that he was not coming back as he was leaving to come to the hospital.  He stated that he wrote that he desired to be in [facility] and he would try to go there after hospitalization.    CSW advised Aram Beecham that should patient's medication be able to be obtained patient would return. Aram Beecham was agreeable and advised that facility staff would provide a cellular number for her to should patient return after she leaves the building or this weekend. CSW sent clinicals via [facility]. Trudy requested that the [facility] and  discharge summary be faxed and CSW manually faxed the information to [facility] as requested.    Ihor Gully, LCSW  [phone]   Ihor Gully, LCSW. [phone]

## 2015-04-24 NOTE — Discharge Summary (Addendum)
Physician Discharge Summary  Patient ID: Steven VANDERPOL Sr. MRN: [id number] DOB/AGE: 03-25-1956 59 y.o.  Admit date: 04/20/2015 Discharge date: 04/24/2015  Admission Diagnoses: Intractable back pain Anxiety Depression Copd  Discharge Diagnoses:  Active Problems:   Anxiety disorder   COPD (chronic obstructive pulmonary disease) (HCC)   Intractable back pain Copd exacerbation Depression   Discharged Condition: stable  Hospital Course: 59 yo male with hx of chronic back pain, depression, Copd, had c/o intractable back pain and admitted for w/up.  MRI showed degenerative changes.  We discontinued lyrica, and switched to gabapentin which helped with the pain.  We had to increase his oxycontin to 20mg  po bid, and oxycodone to 10mg  po q6h prn to control his pain.  He went outside to walk and began wheezing and had worsening of copd exacerbation. Pt was changed from po prednisone to solumedrol and resumed today on prednisone 60mg  po qday.  He continues on levaquin.  Pt has been continued on his current inhalers, which include tudorza, breo, and albuterol.  Pt seems better today in terms of his breathing,  He will be discharged today.   Consults: None  Significant Diagnostic Studies: radiology: MRI  CLINICAL DATA: 59 year old male with chronic lumbar back pain, increasing with difficulty walking for 3 days. No known injury. Prior surgery. Subsequent encounter.  EXAM: MRI LUMBAR SPINE WITHOUT CONTRAST  TECHNIQUE: Multiplanar, multisequence MR imaging of the lumbar spine was performed. No intravenous contrast was administered.  COMPARISON: Lumbar radiographs 04/20/2015. [facility] Lumbar MRI 11/13/2011. Chest CT 02/07/2007.  FINDINGS: Twelve full size ribs demonstrated on the 2008 chest CT. Therefore the S1 level is lumbarized. The fusion hardware is at the L5-S1 level. This numbering system differs from that in 2013.  Posterior and interbody fusion  hardware at L5-S1. Stable grade 1 anterolisthesis at that level. Chronic mild retrolisthesis at L1-L2. Chronic but increased disc desiccation at that level. Stable vertebral height and alignment elsewhere. Trace anterior endplate marrow edema at L1-L2. No other acute osseous abnormality identified.  Visualized lower thoracic spinal cord is normal with conus medularis at L1.  Postoperative changes to the posterior paraspinal soft tissues. Negative visualized abdominal viscera. Partially visible mild bladder wall thickening.  T12-L1: Negative.  L1-L2: Chronic mild retrolisthesis and disc desiccation. Mild left eccentric/ far lateral disc bulging. No stenosis.  L2-L3: Mild facet hypertrophy.  L3-L4: Mild disc desiccation. Moderate facet hypertrophy. No stenosis.  L4-L5: Chronic disc desiccation with left paracentral annular fissure. Moderate facet and mild ligament flavum hypertrophy have not significantly changed. No spinal or lateral recess stenosis. Borderline to mild bilateral L4 foraminal stenosis is stable.  L5-S1: Sequelae of decompression and fusion. Stable thecal sac and foraminal patency.  S1-S2: Lumbarized S1 level. Full size disc with normal signal and morphology. Mild epidural lipomatosis is stable. Mild facet hypertrophy greater on the right. No stenosis.  IMPRESSION: 1. 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 Previous decompression and fusion is at the L5-S1 level, this numbering system differs from that in 2013. 2. Stable postoperative appearance of L5-S1. Mild adjacent segment disease at L4-L5 with mild foraminal stenosis is stable. 3. Chronic but increased disc desiccation at L1-L2 without stenosis. Stable mild disc and moderate facet degeneration elsewhere.   Electronically Signed  By: Genevie Ann M.D.  On: 04/21/2015 08:14       CXR no acute process   Treatments: steroids: solu-medrol  Discharge Exam: Blood  pressure 124/72, pulse 82, temperature 98.2 F (36.8 C), temperature source Oral, resp.  rate 20, height 5\' 9"  (1.753 m), weight 99.837 kg (220 lb 1.6 oz), SpO2 95 %. Heent: anicteric Neck: nio jvd Heart: rrr s1, s2 Lung: slight wheeze, no crackles.  ABd: soft, obese, nt, nd, +bs Ext: no c/c/e   A/P: INTRACTABLE BACK PAIN Cont oxycontin, oxycodone, cont gabapentin,  Please refer to pain clinic for further management upon discharge  Copd exacerbation Levaquin 500mg  po qday x 7 days Taper prednisone 60mg  po qday x 2 days 50mg  poq day x 2 days then 40mg  poq day x2 days then 30mg  po qday x 2 days then 20mg  po qday x 2 days then 10mg  po qday x 2 days.   Cont breo Cont tudorza Cont albuterol  Depression/Anxiety Changed from cymbalta to prozac,  Pt appears to be doing better.    Hypertension Cont minipress for now.   Pituitary adenoma Please arrange follow up with endocrinology on discharge   Disposition: ALF     Medication List    STOP taking these medications        arformoterol 15 MCG/2ML Nebu  Commonly known as:  BROVANA     budesonide 0.25 MG/2ML nebulizer solution  Commonly known as:  PULMICORT     doxycycline 100 MG tablet  Commonly known as:  VIBRA-TABS     DULoxetine 60 MG capsule  Commonly known as:  CYMBALTA     insulin aspart 100 UNIT/ML injection  Commonly known as:  novoLOG     LASIX 20 MG tablet  Generic drug:  furosemide     predniSONE 10 MG (21) Tbpk tablet  Commonly known as:  STERAPRED UNI-PAK 21 TAB  Replaced by:  predniSONE 10 MG tablet     pregabalin 75 MG capsule  Commonly known as:  LYRICA     traMADol 50 MG tablet  Commonly known as:  ULTRAM      TAKE these medications        Aclidinium Bromide 400 MCG/ACT Aepb  Commonly known as:  TUDORZA PRESSAIR  Inhale 1 puff into the lungs 2 (two) times daily.     albuterol 108 (90 BASE) MCG/ACT inhaler  Commonly known as:  PROVENTIL HFA;VENTOLIN HFA  Inhale 2 puffs into the lungs  every 6 (six) hours as needed for wheezing or shortness of breath.     benzonatate 200 MG capsule  Commonly known as:  TESSALON  Take 1 capsule (200 mg total) by mouth 3 (three) times daily as needed for cough.     BREO ELLIPTA IN  Inhale 1 puff into the lungs daily.     FLUoxetine 40 MG capsule  Commonly known as:  PROZAC  Take 1 capsule (40 mg total) by mouth daily.     gabapentin 300 MG capsule  Commonly known as:  NEURONTIN  Take 2 capsules (600 mg total) by mouth 4 (four) times daily.     levofloxacin 500 MG tablet  Commonly known as:  LEVAQUIN  Take 1 tablet (500 mg total) by mouth daily.     lidocaine 5 %  Commonly known as:  LIDODERM  Place 1 patch onto the skin daily. Remove & Discard patch within 12 hours or as directed by MD     loratadine 10 MG tablet  Commonly known as:  CLARITIN  Take 10 mg by mouth daily.     loratadine 10 MG tablet  Commonly known as:  CLARITIN  Take 1 tablet (10 mg total) by mouth daily.     Melatonin 5 MG Tabs  Take 5 mg by mouth at bedtime.     multivitamin capsule  Take 1 capsule by mouth daily. For low vitamin     neomycin-polymyxin b-dexamethasone 3.5-10000-0.1 Susp  Commonly known as:  MAXITROL  Place 1 drop into both eyes 4 (four) times daily. 5 day course starting on 04/17/15     nicotine 21 mg/24hr patch  Commonly known as:  NICODERM CQ - dosed in mg/24 hours  Place 1 patch (21 mg total) onto the skin daily.     oxyCODONE 20 mg 12 hr tablet  Commonly known as:  OXYCONTIN  Take 1 tablet (20 mg total) by mouth every 12 (twelve) hours.     Oxycodone HCl 10 MG Tabs  Take 1 tablet (10 mg total) by mouth every 6 (six) hours as needed for severe pain or breakthrough pain (for pain).     polyethylene glycol packet  Commonly known as:  MIRALAX / GLYCOLAX  Take 17 g by mouth daily as needed for mild constipation.     prazosin 1 MG capsule  Commonly known as:  MINIPRESS  Take 1 mg by mouth at bedtime.     predniSONE 10 MG  tablet  Commonly known as:  DELTASONE  60mg  po qday x 2 days then 50mg  po qday x 2 days then 40mg  po qday x 2 days then 30mg  po qday x 2 days then 20mg  po qday x 2 days then 10mg  po qday x 2 days     senna-docusate 8.6-50 MG tablet  Commonly known as:  Senokot-S  Take 1 tablet by mouth at bedtime as needed for mild constipation.     traZODone 100 MG tablet  Commonly known as:  DESYREL  Take 2 tablets (200 mg total) by mouth at bedtime.         SignedJani Gravel 04/24/2015, 6:37 PM

## 2015-04-25 NOTE — Discharge Summary (Addendum)
Physician Discharge Summary  Patient ID: Steven DOWIS Sr. MRN: [id number] DOB/AGE: 07/07/1955 59 y.o.  Admit date: 04/20/2015 Discharge date: 04/25/2015  Admission Diagnoses: Intractable back pain  Discharge Diagnoses:  Active Problems:   Anxiety disorder   COPD (chronic obstructive pulmonary disease) (HCC)   Intractable back pain   Discharged Condition: stable  Hospital Course: see prior note from yesterday.    Consults: None  Significant Diagnostic Studies: MRI  Treatments: solumedrol, levaquin.    Discharge Exam: Blood pressure 133/78, pulse 79, temperature 98.3 F (36.8 C), temperature source Oral, resp. rate 19, height 5\' 9"  (1.753 m), weight 99.927 kg (220 lb 4.8 oz), SpO2 92 %. See exam yesterday  Disposition: [facility] Defined Note rn gave discharge order without my consent and pt not discharged ? Due to hospital inability to provide medication.  Now social worker asks me to discharge the patient ? And change discharge date.      Medication List    STOP taking these medications        arformoterol 15 MCG/2ML Nebu  Commonly known as:  BROVANA     budesonide 0.25 MG/2ML nebulizer solution  Commonly known as:  PULMICORT     doxycycline 100 MG tablet  Commonly known as:  VIBRA-TABS     DULoxetine 60 MG capsule  Commonly known as:  CYMBALTA     insulin aspart 100 UNIT/ML injection  Commonly known as:  novoLOG     LASIX 20 MG tablet  Generic drug:  furosemide     predniSONE 10 MG (21) Tbpk tablet  Commonly known as:  STERAPRED UNI-PAK 21 TAB  Replaced by:  predniSONE 10 MG tablet     pregabalin 75 MG capsule  Commonly known as:  LYRICA     traMADol 50 MG tablet  Commonly known as:  ULTRAM      TAKE these medications        Aclidinium Bromide 400 MCG/ACT Aepb  Commonly known as:  TUDORZA PRESSAIR  Inhale 1 puff into the lungs 2 (two) times daily.     albuterol 108 (90 BASE) MCG/ACT inhaler  Commonly known as:   PROVENTIL HFA;VENTOLIN HFA  Inhale 2 puffs into the lungs every 6 (six) hours as needed for wheezing or shortness of breath.     benzonatate 200 MG capsule  Commonly known as:  TESSALON  Take 1 capsule (200 mg total) by mouth 3 (three) times daily as needed for cough.     BREO ELLIPTA IN  Inhale 1 puff into the lungs daily.     FLUoxetine 40 MG capsule  Commonly known as:  PROZAC  Take 1 capsule (40 mg total) by mouth daily.     gabapentin 300 MG capsule  Commonly known as:  NEURONTIN  Take 2 capsules (600 mg total) by mouth 4 (four) times daily.     levofloxacin 500 MG tablet  Commonly known as:  LEVAQUIN  Take 1 tablet (500 mg total) by mouth daily.     lidocaine 5 %  Commonly known as:  LIDODERM  Place 1 patch onto the skin daily. Remove & Discard patch within 12 hours or as directed by MD     loratadine 10 MG tablet  Commonly known as:  CLARITIN  Take 10 mg by mouth daily.     loratadine 10 MG tablet  Commonly known as:  CLARITIN  Take 1 tablet (10 mg total) by mouth daily.     Melatonin 5 MG  Tabs  Take 5 mg by mouth at bedtime.     multivitamin capsule  Take 1 capsule by mouth daily. For low vitamin     neomycin-polymyxin b-dexamethasone 3.5-10000-0.1 Susp  Commonly known as:  MAXITROL  Place 1 drop into both eyes 4 (four) times daily. 5 day course starting on 04/17/15     nicotine 21 mg/24hr patch  Commonly known as:  NICODERM CQ - dosed in mg/24 hours  Place 1 patch (21 mg total) onto the skin daily.     oxyCODONE 20 mg 12 hr tablet  Commonly known as:  OXYCONTIN  Take 1 tablet (20 mg total) by mouth every 12 (twelve) hours.     Oxycodone HCl 10 MG Tabs  Take 1 tablet (10 mg total) by mouth every 6 (six) hours as needed for severe pain or breakthrough pain (for pain).     polyethylene glycol packet  Commonly known as:  MIRALAX / GLYCOLAX  Take 17 g by mouth daily as needed for mild constipation.     prazosin 1 MG capsule  Commonly known as:   MINIPRESS  Take 1 mg by mouth at bedtime.     predniSONE 10 MG tablet  Commonly known as:  DELTASONE  60mg  po qday x 2 days then 50mg  po qday x 2 days then 40mg  po qday x 2 days then 30mg  po qday x 2 days then 20mg  po qday x 2 days then 10mg  po qday x 2 days     senna-docusate 8.6-50 MG tablet  Commonly known as:  Senokot-S  Take 1 tablet by mouth at bedtime as needed for mild constipation.     traZODone 100 MG tablet  Commonly known as:  DESYREL  Take 2 tablets (200 mg total) by mouth at bedtime.       a/p See yesterday discharge summary, apparently pt not able to be discharged yesterday due to lack of medication availability?  Uncertain why not discharged cause was told medication would be availlable  Pt will go to ALF   Signed: Jani Gravel 04/25/2015, 2:11 PM

## 2015-04-25 NOTE — Progress Notes (Signed)
Ok per MD for d/c today to [facility] via facility transport.  DC summary and Fl2 completed.  Patient is aware of d/c per nursing.  CSW spoke with patient's nurse via phone to finalize transport arrangements Patient updated Dr. Reynaldo Minium- Medical Director of d/c plan. . CSW signing off. Lorie Phenix. Pauline Good, [place] (weekend coverage)

## 2015-04-25 NOTE — Progress Notes (Signed)
Pt. Sent back to [facility]. Report given to [facility]. Prescriptions for pt.'s pain medicine sent with driver and also medications from the pharmacy for today and tomorrow.

## 2015-06-18 ENCOUNTER — Inpatient Hospital Stay
Admission: EM | Admit: 2015-06-18 | Discharge: 2015-06-30 | DRG: 190 | Disposition: A | Discharge status: Skilled Nursing Facility | Payer: Medicaid Other | Attending: Internal Medicine | Admitting: Internal Medicine

## 2015-06-18 ENCOUNTER — Emergency Department: Payer: Medicaid Other

## 2015-06-18 DIAGNOSIS — F411 Generalized anxiety disorder: Secondary | ICD-10-CM | POA: Diagnosis present

## 2015-06-18 DIAGNOSIS — Z825 Family history of asthma and other chronic lower respiratory diseases: Secondary | ICD-10-CM | POA: Diagnosis not present

## 2015-06-18 DIAGNOSIS — K429 Umbilical hernia without obstruction or gangrene: Secondary | ICD-10-CM

## 2015-06-18 DIAGNOSIS — R000 Tachycardia, unspecified: Secondary | ICD-10-CM

## 2015-06-18 DIAGNOSIS — J9621 Acute and chronic respiratory failure with hypoxia: Secondary | ICD-10-CM | POA: Diagnosis present

## 2015-06-18 DIAGNOSIS — F17200 Nicotine dependence, unspecified, uncomplicated: Secondary | ICD-10-CM | POA: Diagnosis present

## 2015-06-18 DIAGNOSIS — R0989 Other specified symptoms and signs involving the circulatory and respiratory systems: Secondary | ICD-10-CM

## 2015-06-18 DIAGNOSIS — K439 Ventral hernia without obstruction or gangrene: Secondary | ICD-10-CM | POA: Insufficient documentation

## 2015-06-18 DIAGNOSIS — K5903 Drug induced constipation: Secondary | ICD-10-CM | POA: Diagnosis present

## 2015-06-18 DIAGNOSIS — R079 Chest pain, unspecified: Secondary | ICD-10-CM | POA: Diagnosis present

## 2015-06-18 DIAGNOSIS — Z765 Malingerer [conscious simulation]: Secondary | ICD-10-CM | POA: Diagnosis present

## 2015-06-18 DIAGNOSIS — Z79891 Long term (current) use of opiate analgesic: Secondary | ICD-10-CM | POA: Diagnosis not present

## 2015-06-18 DIAGNOSIS — J209 Acute bronchitis, unspecified: Secondary | ICD-10-CM | POA: Diagnosis present

## 2015-06-18 DIAGNOSIS — K219 Gastro-esophageal reflux disease without esophagitis: Secondary | ICD-10-CM | POA: Diagnosis present

## 2015-06-18 DIAGNOSIS — Z590 Homelessness: Secondary | ICD-10-CM | POA: Diagnosis not present

## 2015-06-18 DIAGNOSIS — R042 Hemoptysis: Secondary | ICD-10-CM

## 2015-06-18 DIAGNOSIS — Z800 Family history of malignant neoplasm of digestive organs: Secondary | ICD-10-CM

## 2015-06-18 DIAGNOSIS — J440 Chronic obstructive pulmonary disease with acute lower respiratory infection: Secondary | ICD-10-CM | POA: Diagnosis not present

## 2015-06-18 DIAGNOSIS — G8929 Other chronic pain: Secondary | ICD-10-CM | POA: Diagnosis present

## 2015-06-18 DIAGNOSIS — J45909 Unspecified asthma, uncomplicated: Secondary | ICD-10-CM | POA: Diagnosis present

## 2015-06-18 DIAGNOSIS — J189 Pneumonia, unspecified organism: Secondary | ICD-10-CM | POA: Diagnosis present

## 2015-06-18 DIAGNOSIS — I10 Essential (primary) hypertension: Secondary | ICD-10-CM | POA: Diagnosis present

## 2015-06-18 DIAGNOSIS — M549 Dorsalgia, unspecified: Secondary | ICD-10-CM | POA: Diagnosis present

## 2015-06-18 DIAGNOSIS — F4323 Adjustment disorder with mixed anxiety and depressed mood: Secondary | ICD-10-CM | POA: Diagnosis not present

## 2015-06-18 DIAGNOSIS — J441 Chronic obstructive pulmonary disease with (acute) exacerbation: Secondary | ICD-10-CM | POA: Diagnosis present

## 2015-06-18 DIAGNOSIS — G629 Polyneuropathy, unspecified: Secondary | ICD-10-CM | POA: Diagnosis present

## 2015-06-18 HISTORY — DX: Unspecified asthma, uncomplicated: J45.909

## 2015-06-18 HISTORY — DX: Essential (primary) hypertension: I10

## 2015-06-18 LAB — BASIC METABOLIC PANEL
ANION GAP: 10 (ref 5–15)
BUN: 8 mg/dL (ref 6–20)
CALCIUM: 8.8 mg/dL — AB (ref 8.9–10.3)
CO2: 26 mmol/L (ref 22–32)
Chloride: 101 mmol/L (ref 101–111)
Creatinine, Ser: 0.95 mg/dL (ref 0.61–1.24)
GFR calc Af Amer: >60 mL/min (ref >60)
GLUCOSE: 165 mg/dL — AB (ref 65–99)
POTASSIUM: 3.7 mmol/L (ref 3.5–5.1)
SODIUM: 137 mmol/L (ref 135–145)

## 2015-06-18 LAB — CBC
HCT: 50.6 % (ref 40.0–52.0)
Hemoglobin: 16.5 g/dL (ref 13.0–18.0)
MCH: 28.9 pg (ref 26.0–34.0)
MCHC: 32.5 g/dL (ref 32.0–36.0)
MCV: 89 fL (ref 80.0–100.0)
Platelets: 241 10*3/uL (ref 150–440)
RBC: 5.69 MIL/uL (ref 4.40–5.90)
RDW: 14.7 % — ABNORMAL HIGH (ref 11.5–14.5)
WBC: 7.7 10*3/uL (ref 3.8–10.6)

## 2015-06-18 LAB — TROPONIN I

## 2015-06-18 MED ORDER — IPRATROPIUM-ALBUTEROL 0.5-2.5 (3) MG/3ML IN SOLN
3.0000 mL | Freq: Once | RESPIRATORY_TRACT | Status: AC
  Administered 2015-06-18: 3 mL via RESPIRATORY_TRACT
  Filled 2015-06-18: qty 3

## 2015-06-18 MED ORDER — CETYLPYRIDINIUM CHLORIDE 0.05 % MT LIQD
7.0000 mL | Freq: Two times a day (BID) | OROMUCOSAL | Status: DC
  Administered 2015-06-18: 7 mL via OROMUCOSAL

## 2015-06-18 MED ORDER — LEVOFLOXACIN 750 MG PO TABS: 750.0000 mg | ORAL_TABLET | Freq: Every day | ORAL | Status: DC

## 2015-06-18 MED ORDER — MELATONIN 5 MG PO TABS: 5.0000 mg | ORAL_TABLET | Freq: Every day | ORAL | Status: DC

## 2015-06-18 MED ORDER — FLUOXETINE HCL 20 MG PO CAPS: 40.0000 mg | ORAL_CAPSULE | Freq: Every day | ORAL | Status: DC

## 2015-06-18 MED ORDER — ASPIRIN EC 325 MG PO TBEC
325.0000 mg | DELAYED_RELEASE_TABLET | Freq: Every day | ORAL | Status: DC
  Administered 2015-06-18 – 2015-06-30 (×13): 325 mg via ORAL
  Filled 2015-06-18 (×13): qty 1

## 2015-06-18 MED ORDER — LEVOFLOXACIN IN D5W 750 MG/150ML IV SOLN
750.0000 mg | INTRAVENOUS | Status: DC
  Filled 2015-06-18: qty 150

## 2015-06-18 MED ORDER — LORATADINE 10 MG PO TABS: 10.0000 mg | ORAL_TABLET | Freq: Every day | ORAL | Status: DC

## 2015-06-18 MED ORDER — MORPHINE SULFATE (PF) 2 MG/ML IV SOLN: 2.0000 mg | INTRAVENOUS | Status: DC | PRN

## 2015-06-18 MED ORDER — BENZONATATE 100 MG PO CAPS
200.0000 mg | ORAL_CAPSULE | Freq: Three times a day (TID) | ORAL | Status: DC | PRN
  Administered 2015-06-20 – 2015-06-25 (×7): 200 mg via ORAL
  Filled 2015-06-18 (×10): qty 2

## 2015-06-18 MED ORDER — OXYCODONE HCL 5 MG PO TABS
10.0000 mg | ORAL_TABLET | Freq: Four times a day (QID) | ORAL | Status: DC | PRN
  Administered 2015-06-18 – 2015-06-20 (×5): 10 mg via ORAL
  Filled 2015-06-18 (×5): qty 2

## 2015-06-18 MED ORDER — ONDANSETRON HCL 4 MG/2ML IJ SOLN
4.0000 mg | Freq: Four times a day (QID) | INTRAMUSCULAR | Status: DC | PRN
  Administered 2015-06-18 – 2015-06-30 (×4): 4 mg via INTRAVENOUS
  Filled 2015-06-18 (×4): qty 2

## 2015-06-18 MED ORDER — IPRATROPIUM-ALBUTEROL 0.5-2.5 (3) MG/3ML IN SOLN
3.0000 mL | Freq: Four times a day (QID) | RESPIRATORY_TRACT | Status: DC
  Administered 2015-06-18 – 2015-06-19 (×2): 3 mL via RESPIRATORY_TRACT
  Filled 2015-06-18 (×3): qty 3

## 2015-06-18 MED ORDER — METHYLPREDNISOLONE SODIUM SUCC 40 MG IJ SOLR
40.0000 mg | Freq: Four times a day (QID) | INTRAMUSCULAR | Status: DC
  Administered 2015-06-18 – 2015-06-19 (×3): 40 mg via INTRAVENOUS
  Filled 2015-06-18 (×3): qty 1

## 2015-06-18 MED ORDER — NICOTINE 21 MG/24HR TD PT24: 21.0000 mg | MEDICATED_PATCH | Freq: Every day | TRANSDERMAL | Status: DC

## 2015-06-18 MED ORDER — OXYCODONE HCL 5 MG PO TABS
15.0000 mg | ORAL_TABLET | Freq: Once | ORAL | Status: AC
  Administered 2015-06-18: 15 mg via ORAL
  Filled 2015-06-18: qty 3

## 2015-06-18 MED ORDER — DEXTROSE 5 % IV SOLN
500.0000 mg | INTRAVENOUS | Status: DC
  Administered 2015-06-18: 22:00:00 via INTRAVENOUS
  Filled 2015-06-18 (×3): qty 500

## 2015-06-18 MED ORDER — ACETAMINOPHEN 325 MG PO TABS: 650.0000 mg | ORAL_TABLET | ORAL | Status: DC | PRN

## 2015-06-18 MED ORDER — RAMELTEON 8 MG PO TABS
8.0000 mg | ORAL_TABLET | Freq: Every day | ORAL | Status: DC
  Administered 2015-06-18 – 2015-06-29 (×12): 8 mg via ORAL
  Filled 2015-06-18 (×12): qty 1

## 2015-06-18 MED ORDER — ADULT MULTIVITAMIN W/MINERALS CH
1.0000 | ORAL_TABLET | Freq: Every day | ORAL | Status: DC
  Administered 2015-06-18 – 2015-06-30 (×13): 1 via ORAL
  Filled 2015-06-18 (×13): qty 1

## 2015-06-18 MED ORDER — OXYCODONE HCL 5 MG PO TABS
5.0000 mg | ORAL_TABLET | Freq: Once | ORAL | Status: AC
  Administered 2015-06-18: 5 mg via ORAL
  Filled 2015-06-18: qty 1

## 2015-06-18 MED ORDER — LIDOCAINE 5 % EX PTCH
1.0000 | MEDICATED_PATCH | CUTANEOUS | Status: DC
  Administered 2015-06-20 – 2015-06-30 (×9): 1 via TRANSDERMAL
  Filled 2015-06-18 (×14): qty 1

## 2015-06-18 MED ORDER — ALBUTEROL SULFATE (2.5 MG/3ML) 0.083% IN NEBU
2.5000 mg | INHALATION_SOLUTION | RESPIRATORY_TRACT | Status: DC | PRN
Start: 2015-06-18 — End: 2015-06-30

## 2015-06-18 MED ORDER — ALBUTEROL SULFATE (2.5 MG/3ML) 0.083% IN NEBU
2.5000 mg | INHALATION_SOLUTION | Freq: Once | RESPIRATORY_TRACT | Status: AC
  Administered 2015-06-18: 2.5 mg via RESPIRATORY_TRACT
  Filled 2015-06-18: qty 3

## 2015-06-18 MED ORDER — ENOXAPARIN SODIUM 40 MG/0.4ML ~~LOC~~ SOLN
40.0000 mg | SUBCUTANEOUS | Status: DC
  Administered 2015-06-18 – 2015-06-29 (×12): 40 mg via SUBCUTANEOUS
  Filled 2015-06-18 (×12): qty 0.4

## 2015-06-18 MED ORDER — PRAZOSIN HCL 1 MG PO CAPS
1.0000 mg | ORAL_CAPSULE | Freq: Every day | ORAL | Status: DC
  Administered 2015-06-18 – 2015-06-29 (×12): 1 mg via ORAL
  Filled 2015-06-18 (×13): qty 1

## 2015-06-18 MED ORDER — TRAZODONE HCL 100 MG PO TABS
200.0000 mg | ORAL_TABLET | Freq: Every day | ORAL | Status: DC
  Administered 2015-06-18 – 2015-06-29 (×12): 200 mg via ORAL
  Filled 2015-06-18 (×12): qty 2

## 2015-06-18 MED ORDER — LORATADINE 10 MG PO TABS
10.0000 mg | ORAL_TABLET | Freq: Every day | ORAL | Status: DC
  Administered 2015-06-19 – 2015-06-30 (×12): 10 mg via ORAL
  Filled 2015-06-18 (×13): qty 1

## 2015-06-18 MED ORDER — FLUOXETINE HCL 20 MG PO CAPS
40.0000 mg | ORAL_CAPSULE | Freq: Every day | ORAL | Status: DC
  Administered 2015-06-18 – 2015-06-30 (×13): 40 mg via ORAL
  Filled 2015-06-18 (×13): qty 2

## 2015-06-18 MED ORDER — ALBUTEROL SULFATE HFA 108 (90 BASE) MCG/ACT IN AERS: 2.0000 | INHALATION_SPRAY | Freq: Four times a day (QID) | RESPIRATORY_TRACT | Status: DC | PRN

## 2015-06-18 MED ORDER — METHYLPREDNISOLONE SODIUM SUCC 125 MG IJ SOLR
80.0000 mg | Freq: Once | INTRAMUSCULAR | Status: AC
  Administered 2015-06-18: 80 mg via INTRAVENOUS
  Filled 2015-06-18: qty 2

## 2015-06-18 MED ORDER — NEOMYCIN-POLYMYXIN-DEXAMETH 3.5-10000-0.1 OP SUSP
1.0000 [drp] | Freq: Four times a day (QID) | OPHTHALMIC | Status: DC
  Administered 2015-06-18 – 2015-06-30 (×36): 1 [drp] via OPHTHALMIC
  Filled 2015-06-18: qty 5

## 2015-06-18 MED ORDER — IOHEXOL 350 MG/ML SOLN
80.0000 mL | Freq: Once | INTRAVENOUS | Status: AC | PRN
  Administered 2015-06-18: 75 mL via INTRAVENOUS

## 2015-06-18 MED ORDER — GABAPENTIN 400 MG PO CAPS
400.0000 mg | ORAL_CAPSULE | Freq: Once | ORAL | Status: AC
  Administered 2015-06-18: 400 mg via ORAL
  Filled 2015-06-18: qty 1

## 2015-06-18 MED ORDER — GABAPENTIN 300 MG PO CAPS
600.0000 mg | ORAL_CAPSULE | Freq: Four times a day (QID) | ORAL | Status: DC
  Administered 2015-06-18 – 2015-06-30 (×46): 600 mg via ORAL
  Filled 2015-06-18 (×48): qty 2

## 2015-06-18 MED ORDER — SODIUM CHLORIDE 0.9 % IV SOLN
8.0000 mg | Freq: Once | INTRAVENOUS | Status: AC
  Administered 2015-06-18: 8 mg via INTRAVENOUS
  Filled 2015-06-18: qty 4

## 2015-06-18 MED ORDER — FENTANYL CITRATE (PF) 100 MCG/2ML IJ SOLN
50.0000 ug | Freq: Once | INTRAMUSCULAR | Status: AC
  Administered 2015-06-18: 50 ug via INTRAVENOUS
  Filled 2015-06-18: qty 2

## 2015-06-18 MED ORDER — NICOTINE 14 MG/24HR TD PT24
14.0000 mg | MEDICATED_PATCH | Freq: Every day | TRANSDERMAL | Status: DC
  Administered 2015-06-18 – 2015-06-30 (×13): 14 mg via TRANSDERMAL
  Filled 2015-06-18 (×13): qty 1

## 2015-06-18 MED ORDER — SENNOSIDES-DOCUSATE SODIUM 8.6-50 MG PO TABS
1.0000 | ORAL_TABLET | Freq: Every evening | ORAL | Status: DC | PRN
Start: 2015-06-18 — End: 2015-06-30
  Administered 2015-06-20 – 2015-06-29 (×5): 1 via ORAL
  Filled 2015-06-18 (×5): qty 1

## 2015-06-18 MED ORDER — POLYETHYLENE GLYCOL 3350 17 G PO PACK
17.0000 g | PACK | Freq: Every day | ORAL | Status: DC | PRN
  Administered 2015-06-19 – 2015-06-29 (×5): 17 g via ORAL
  Filled 2015-06-18 (×6): qty 1

## 2015-06-18 MED ORDER — OXYCODONE HCL ER 20 MG PO T12A
20.0000 mg | EXTENDED_RELEASE_TABLET | Freq: Two times a day (BID) | ORAL | Status: DC
  Administered 2015-06-18 – 2015-06-30 (×24): 20 mg via ORAL
  Filled 2015-06-18 (×25): qty 1

## 2015-06-18 NOTE — ED Notes (Signed)
MD at bedside per family request.
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
## 2015-06-18 NOTE — Progress Notes (Signed)
Patient admitted to unit. Oriented to room, call bell, and staff. Bed in lowest position. Fall safety plan reviewed. Full assessment to Epic. Skin assessment verified with Ginger Carne RN. Telemetry box verification with tele clerk and Grayland Ormond NT- Box#: [id number]. Will continue to monitor.
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
## 2015-06-18 NOTE — Progress Notes (Signed)
Pt refusing his evening dose of Levaquin-- he states he will get discharged from the hospital and break out in small yellow blisters.  Pt wants to make MD aware that he is getting hot/cold- will sweat and get cold.  MD also asking about melatonin.  That medication is not carried in our hospital pharmacy.  MD to change IV abx and to give pharmacy substitute of melatonin.   Jessee Avers

## 2015-06-18 NOTE — Progress Notes (Signed)
Pt requesting nicotine patch 14mg  instead of 21 mg. Dr. Margaretmary Eddy aware - ok for RN to modify order.

## 2015-06-18 NOTE — ED Notes (Signed)
Dr Kaminski at bedside.  

## 2015-06-18 NOTE — ED Notes (Signed)
Pt c/o CP X 2 days that started while he was watching TV. Pt clenching chest.

## 2015-06-18 NOTE — ED Notes (Signed)
States pain is improved  Rates about 4/10

## 2015-06-19 ENCOUNTER — Inpatient Hospital Stay (HOSPITAL_COMMUNITY)
Admit: 2015-06-19 | Discharge: 2015-06-19 | Disposition: A | Discharge status: Home / Self Care | Payer: Medicaid Other | Attending: Internal Medicine | Admitting: Internal Medicine

## 2015-06-19 DIAGNOSIS — R079 Chest pain, unspecified: Secondary | ICD-10-CM

## 2015-06-19 LAB — GLUCOSE, CAPILLARY: GLUCOSE-CAPILLARY: 229 mg/dL — AB (ref 65–99)

## 2015-06-19 MED ORDER — METHYLPREDNISOLONE SODIUM SUCC 125 MG IJ SOLR
60.0000 mg | Freq: Four times a day (QID) | INTRAMUSCULAR | Status: DC
  Administered 2015-06-19 – 2015-06-25 (×23): 60 mg via INTRAVENOUS
  Filled 2015-06-19 (×23): qty 2

## 2015-06-19 MED ORDER — BUDESONIDE 0.25 MG/2ML IN SUSP
0.2500 mg | Freq: Two times a day (BID) | RESPIRATORY_TRACT | Status: DC
  Administered 2015-06-19 – 2015-06-25 (×11): 0.25 mg via RESPIRATORY_TRACT
  Filled 2015-06-19 (×11): qty 2

## 2015-06-19 MED ORDER — IPRATROPIUM-ALBUTEROL 0.5-2.5 (3) MG/3ML IN SOLN
3.0000 mL | RESPIRATORY_TRACT | Status: DC
  Administered 2015-06-19 – 2015-06-30 (×63): 3 mL via RESPIRATORY_TRACT
  Filled 2015-06-19 (×62): qty 3

## 2015-06-19 MED ORDER — AZITHROMYCIN 250 MG PO TABS
500.0000 mg | ORAL_TABLET | Freq: Every day | ORAL | Status: DC
  Administered 2015-06-19 – 2015-06-21 (×3): 500 mg via ORAL
  Filled 2015-06-19 (×3): qty 2

## 2015-06-19 MED ORDER — MORPHINE SULFATE (PF) 2 MG/ML IV SOLN
2.0000 mg | INTRAVENOUS | Status: DC | PRN
Start: 2015-06-19 — End: 2015-06-20

## 2015-06-19 NOTE — Progress Notes (Signed)
[place] at [place] NAME: Steven Wiley    MR#:  [id number]  DATE OF BIRTH:  29-Jun-1955  SUBJECTIVE:   Here due to shortness of breath, chest pain and noted to be in COPD exacerbation. Still has significant wheezing and bronchospasm today.  REVIEW OF SYSTEMS:    Review of Systems  Constitutional: Negative for fever and chills.  HENT: Negative for congestion and tinnitus.   Eyes: Negative for blurred vision and double vision.  Respiratory: Positive for cough, shortness of breath and wheezing.   Cardiovascular: Negative for chest pain, orthopnea and PND.  Gastrointestinal: Negative for nausea, vomiting, abdominal pain and diarrhea.  Genitourinary: Negative for dysuria and hematuria.  Neurological: Negative for dizziness, sensory change and focal weakness.  All other systems reviewed and are negative.   Nutrition: Heart healthy Tolerating Diet: Yes Tolerating PT: Await evaluation   DRUG ALLERGIES:   Allergies  Allergen Reactions  . Spiriva [Tiotropium Bromide Monohydrate] Other (See Comments)    Lung congestion  . Acetaminophen Hives, Nausea Only, Other (See Comments) and Nausea And Vomiting    +stomach problems   . Advair Diskus [Fluticasone-Salmeterol]     Lung congestion  . Hydrocodone-Acetaminophen Nausea Only  . Penicillins Hives, Rash and Other (See Comments)    Welts Hives, blisters Yes. hosp.    VITALS:  Blood pressure 117/83, pulse 75, temperature 97.7 F (36.5 C), temperature source Oral, resp. rate 20, height 5\' 10"  (1.778 m), weight 99.791 kg (220 lb), SpO2 93 %.  PHYSICAL EXAMINATION:   Physical Exam  GENERAL:  59 y.o.-year-old patient lying in the bed in mild respiratory distress.  EYES: Pupils equal, round, reactive to light and accommodation. No scleral icterus. Extraocular muscles intact.  HEENT: Head atraumatic, normocephalic. Oropharynx and nasopharynx clear.  NECK:  Supple, no jugular venous  distention. No thyroid enlargement, no tenderness.  LUNGS: Good air entry bilaterally. Diffuse wheezing, rhonchi bilaterally. No use of accessory muscles of respiration.  CARDIOVASCULAR: S1, S2 normal. No murmurs, rubs, or gallops.  ABDOMEN: Soft, nontender, nondistended. Bowel sounds present. No organomegaly or mass.  EXTREMITIES: No cyanosis, clubbing or edema b/l.    NEUROLOGIC: Cranial nerves II through XII are intact. No focal Motor or sensory deficits b/l.   PSYCHIATRIC: The patient is alert and oriented x 3.  SKIN: No obvious rash, lesion, or ulcer.    LABORATORY PANEL:   CBC  Recent Labs Lab 06/18/15 0942  WBC 7.7  HGB 16.5  HCT 50.6  PLT 241   ------------------------------------------------------------------------------------------------------------------  Chemistries   Recent Labs Lab 06/18/15 0942  NA 137  K 3.7  CL 101  CO2 26  GLUCOSE 165*  BUN 8  CREATININE 0.95  CALCIUM 8.8*   ------------------------------------------------------------------------------------------------------------------  Cardiac Enzymes  Recent Labs Lab 06/18/15 0942  TROPONINI <0.03   ------------------------------------------------------------------------------------------------------------------  RADIOLOGY:  Dg Chest 2 View  06/18/2015  CLINICAL DATA:  Whole body pain for 3 days, worsening yesterday. EXAM: CHEST  2 VIEW COMPARISON:  04/22/2015. FINDINGS: There is hyperinflation of the lungs compatible with COPD. Chronic biapical scarring, right greater than left. Scarring at the right lung base. No acute airspace opacities or effusions. Heart is normal size. IMPRESSION: COPD/chronic changes.  No active disease. Electronically Signed   By: Rolm Baptise M.D.   On: 06/18/2015 10:14   Ct Angio Chest Pe W/cm &/or Wo Cm  06/18/2015  CLINICAL DATA:  Chest pain for 2 days. EXAM: CT ANGIOGRAPHY CHEST WITH CONTRAST TECHNIQUE: Multidetector  CT imaging of the chest was performed using  the standard protocol during bolus administration of intravenous contrast. Multiplanar CT image reconstructions and MIPs were obtained to evaluate the vascular anatomy. CONTRAST:  81mL OMNIPAQUE IOHEXOL 350 MG/ML SOLN COMPARISON:  11/27/2013 FINDINGS: There is adequate opacification of the pulmonary arteries. There is no pulmonary embolus. The main pulmonary artery, right main pulmonary artery and left main pulmonary arteries are normal in size. The heart size is normal. There is no pericardial effusion. There is right posterior pleural thickening and right apical scarring. There are bilateral emphysematous changes. There is no pneumothorax. There is no focal consolidation. There is an enlarged subcarinal lymph node measuring 15 mm in short axis. There are calcified right hilar lymph nodes likely reflecting sequela prior granulomatous disease. There is no lytic or blastic osseous lesion. The visualized portions of the upper abdomen are unremarkable. Review of the MIP images confirms the above findings. IMPRESSION: 1. No evidence of a pulmonary embolus. 2. Right posterior pleural thickening and right apical scarring. Electronically Signed   By: Kathreen Devoid   On: 06/18/2015 14:25     ASSESSMENT AND PLAN:   59 year old male with past medical history of COPD, chronic pain, depression, neuropathy, history of anxiety, GERD, who presented to the hospital due to shortness of breath and noted to be in acute COPD exacerbation.  #1 COPD exacerbation-this is likely due to underlying acute bronchitis, acute with ongoing tobacco abuse. -Continue IV steroids, around-the-clock nebulizer treatments, I will add Pulmicort nebs. Continue Zithromax. -Assess for home oxygen prior to discharge.  #2 chronic pain-continue OxyContin, oxycodone, as needed morphine, lidocaine patch. -Patient does have high drug seeking behavior.  #3 neuropathy-continue Neurontin.  #4 depression-continue Prozac.  #5 tobacco abuse-continue  nicotine patch.   All the records are reviewed and case discussed with Care Management/Social Workerr. Management plans discussed with the patient, family and they are in agreement.  CODE STATUS: Full  DVT Prophylaxis: Lovenox  TOTAL TIME TAKING CARE OF THIS PATIENT: 30 minutes.   POSSIBLE D/C IN 1-2 DAYS, DEPENDING ON CLINICAL CONDITION.   Henreitta Leber M.D on 06/19/2015 at 3:47 PM  Between 7am to 6pm - Pager - [phone]  After 6pm go to [email or web address] - password [email or web address] [facility]  Office  [phone]  CC: Primary care physician; Marden Noble, MD

## 2015-06-19 NOTE — Progress Notes (Signed)
Pt tearful - says he would just like someone to sit and talk with him. This nurse stayed approximately 30 minutes - seems to have helped. Called chaplain to come speak with him as well sometime this afternoon or tomorrow.

## 2015-06-19 NOTE — Care Management (Signed)
Discussed during progression the need to assess for need of home 02 due to admitting dx.  Patient presents from home.  Patient lives alone.  Says he got off track because he did not take his medications.  When asked why patient gets off track conversation topic and starts discussing how he was taking care of his sick friend for several weeks.  Patient has PCP- Nicky Pugh.  He has not made appointments "just because I got busy."  Patient has chronic back pain from a work related injury.  He denies that he has financial problems that prevent him from seeking medical care or paying for meds.  He has medicaid.  Lives with his brother and confirms address and phone number.

## 2015-06-19 NOTE — Progress Notes (Signed)
Stopped in. Patient resting. [place] 1200

## 2015-06-19 NOTE — Progress Notes (Signed)
PHARMACIST - PHYSICIAN COMMUNICATION DR:   Steven Wiley CONCERNING: Antibiotic IV to Oral Route Change Policy  RECOMMENDATION: This patient is receiving Azithromycin  by the intravenous route.  Based on criteria approved by the Pharmacy and Therapeutics Committee, the antibiotic(s) is/are being converted to the equivalent oral dose form(s).   DESCRIPTION: These criteria include:  Patient being treated for a respiratory tract infection, urinary tract infection, cellulitis or clostridium difficile associated diarrhea if on metronidazole  The patient is not neutropenic and does not exhibit a GI malabsorption state  The patient is eating (either orally or via tube) and/or has been taking other orally administered medications for a least 24 hours  The patient is improving clinically and has a Tmax < 100.5  If you have questions about this conversion, please contact the Pharmacy Department  []   [phone] )  Steven Wiley [x]   [phone] )  [facility] []   [phone] )  Steven Wiley []   [phone] )  [facility] []   [phone] )  Steven Wiley    [place]e [place], PharmD, BCPS Clinical Pharmacist.

## 2015-06-19 NOTE — Progress Notes (Signed)
*  PRELIMINARY RESULTS* Echocardiogram 2D Echocardiogram has been performed.  Steven Wiley 06/19/2015, 8:37 AM

## 2015-06-20 MED ORDER — OXYCODONE HCL 5 MG PO TABS
10.0000 mg | ORAL_TABLET | ORAL | Status: DC | PRN
  Administered 2015-06-20 – 2015-06-30 (×53): 10 mg via ORAL
  Filled 2015-06-20 (×54): qty 2

## 2015-06-20 MED ORDER — MORPHINE SULFATE (PF) 2 MG/ML IV SOLN: 2.0000 mg | Freq: Four times a day (QID) | INTRAVENOUS | Status: DC | PRN

## 2015-06-20 NOTE — Progress Notes (Signed)
Not able to administer svn treatment at 4 due to 2 resp failure patient requiring intubation by RT in icu

## 2015-06-20 NOTE — Progress Notes (Signed)
[place] at [place] NAME: Steven Wiley    MR#:  [id number]  DATE OF BIRTH:  01-Mar-1956  SUBJECTIVE:   Here due to shortness of breath, chest pain and noted to be in COPD exacerbation. Still continues to have significant wheezing and bronchospasm   REVIEW OF SYSTEMS:    Review of Systems  Constitutional: Negative for fever and chills.  HENT: Negative for congestion and tinnitus.   Eyes: Negative for blurred vision and double vision.  Respiratory: Positive for cough, shortness of breath and wheezing.   Cardiovascular: Negative for chest pain, orthopnea and PND.  Gastrointestinal: Negative for nausea, vomiting, abdominal pain and diarrhea.  Genitourinary: Negative for dysuria and hematuria.  Neurological: Negative for dizziness, sensory change and focal weakness.  All other systems reviewed and are negative.   Nutrition: Heart healthy Tolerating Diet: Yes Tolerating PT: Await evaluation   DRUG ALLERGIES:   Allergies  Allergen Reactions  . Spiriva [Tiotropium Bromide Monohydrate] Other (See Comments)    Lung congestion  . Acetaminophen Hives, Nausea Only, Other (See Comments) and Nausea And Vomiting    +stomach problems   . Advair Diskus [Fluticasone-Salmeterol]     Lung congestion  . Hydrocodone-Acetaminophen Nausea Only  . Penicillins Hives, Rash and Other (See Comments)    Welts Hives, blisters Yes. hosp.    VITALS:  Blood pressure 121/63, pulse 79, temperature 98 F (36.7 C), temperature source Oral, resp. rate 18, height 5\' 10"  (1.778 m), weight 99.791 kg (220 lb), SpO2 95 %.  PHYSICAL EXAMINATION:   Physical Exam  GENERAL:  59 y.o.-year-old patient lying in the bed in mild to moderate respiratory distress.  EYES: Pupils equal, round, reactive to light and accommodation. No scleral icterus. Extraocular muscles intact.  HEENT: Head atraumatic, normocephalic. Oropharynx and nasopharynx clear.  NECK:  Supple, no  jugular venous distention. No thyroid enlargement, no tenderness.  LUNGS: Good air entry bilaterally. Diffuse wheezing, rhonchi bilaterally. No use of accessory muscles of respiration.  CARDIOVASCULAR: S1, S2 normal. No murmurs, rubs, or gallops.  ABDOMEN: Soft, nontender, nondistended. Bowel sounds present. No organomegaly or mass.  EXTREMITIES: No cyanosis, clubbing or edema b/l.    NEUROLOGIC: Cranial nerves II through XII are intact. No focal Motor or sensory deficits b/l.   PSYCHIATRIC: The patient is alert and oriented x 3.  SKIN: No obvious rash, lesion, or ulcer.    LABORATORY PANEL:   CBC  Recent Labs Lab 06/18/15 0942  WBC 7.7  HGB 16.5  HCT 50.6  PLT 241   ------------------------------------------------------------------------------------------------------------------  Chemistries   Recent Labs Lab 06/18/15 0942  NA 137  K 3.7  CL 101  CO2 26  GLUCOSE 165*  BUN 8  CREATININE 0.95  CALCIUM 8.8*   ------------------------------------------------------------------------------------------------------------------  Cardiac Enzymes  Recent Labs Lab 06/18/15 0942  TROPONINI <0.03   ------------------------------------------------------------------------------------------------------------------  RADIOLOGY:  Ct Angio Chest Pe W/cm &/or Wo Cm  06/18/2015  CLINICAL DATA:  Chest pain for 2 days. EXAM: CT ANGIOGRAPHY CHEST WITH CONTRAST TECHNIQUE: Multidetector CT imaging of the chest was performed using the standard protocol during bolus administration of intravenous contrast. Multiplanar CT image reconstructions and MIPs were obtained to evaluate the vascular anatomy. CONTRAST:  67mL OMNIPAQUE IOHEXOL 350 MG/ML SOLN COMPARISON:  11/27/2013 FINDINGS: There is adequate opacification of the pulmonary arteries. There is no pulmonary embolus. The main pulmonary artery, right main pulmonary artery and left main pulmonary arteries are normal in size. The heart size is  normal.  There is no pericardial effusion. There is right posterior pleural thickening and right apical scarring. There are bilateral emphysematous changes. There is no pneumothorax. There is no focal consolidation. There is an enlarged subcarinal lymph node measuring 15 mm in short axis. There are calcified right hilar lymph nodes likely reflecting sequela prior granulomatous disease. There is no lytic or blastic osseous lesion. The visualized portions of the upper abdomen are unremarkable. Review of the MIP images confirms the above findings. IMPRESSION: 1. No evidence of a pulmonary embolus. 2. Right posterior pleural thickening and right apical scarring. Electronically Signed   By: Kathreen Devoid   On: 06/18/2015 14:25     ASSESSMENT AND PLAN:   59 year old male with past medical history of COPD, chronic pain, depression, neuropathy, history of anxiety, GERD, who presented to the hospital due to shortness of breath and noted to be in acute COPD exacerbation.  #1 COPD exacerbation-this is  due to underlying acute bronchitis, with ongoing tobacco abuse. -Continue IV steroids, around-the-clock nebulizer treatments, Pulmicort nebs, Zithromax. -Assess for home oxygen prior to discharge.  #2 chronic pain-continue OxyContin, oxycodone, will wean morphine, cont. lidocaine patch. -Patient does have high drug seeking behavior.  #3 neuropathy-continue Neurontin.  #4 depression-continue Prozac.  #5 tobacco abuse-continue nicotine patch.  D/c tele.  Possible d/c home in next 1-2 days once resp. Status improves.   All the records are reviewed and case discussed with Care Management/Social Workerr. Management plans discussed with the patient, family and they are in agreement.  CODE STATUS: Full  DVT Prophylaxis: Lovenox  TOTAL TIME TAKING CARE OF THIS PATIENT: 30 minutes.   POSSIBLE D/C IN 1-2 DAYS, DEPENDING ON CLINICAL CONDITION.   Henreitta Leber M.D on 06/20/2015 at 1:49 PM  Between 7am to  6pm - Pager - [phone]  After 6pm go to [email or web address] - password [email or web address] [facility]  Office  [phone]  CC: Primary care physician; Marden Noble, MD

## 2015-06-20 NOTE — Progress Notes (Signed)
A & O. Pt reported pain and received oxy. No tele. 3 L of oxygen. Urinal. Takes meds ok. Report called to [facility] on [address]. Pt has no further concerns at this time.

## 2015-06-21 NOTE — Progress Notes (Signed)
[name] NAME: Steven Wiley    MR#:  [id number]  DATE OF BIRTH:  Mar 22, 1956  SUBJECTIVE:   Here due to shortness of breath, chest pain and noted to be in COPD exacerbation. Wheezing/bronchospams a bit improved.  Anxious at times.   REVIEW OF SYSTEMS:    Review of Systems  Constitutional: Negative for fever and chills.  HENT: Negative for congestion and tinnitus.   Eyes: Negative for blurred vision and double vision.  Respiratory: Positive for cough, shortness of breath and wheezing.   Cardiovascular: Negative for chest pain, orthopnea and PND.  Gastrointestinal: Negative for nausea, vomiting, abdominal pain and diarrhea.  Genitourinary: Negative for dysuria and hematuria.  Neurological: Negative for dizziness, sensory change and focal weakness.  All other systems reviewed and are negative.   Nutrition: Heart healthy Tolerating Diet: Yes Tolerating PT: Await evaluation   DRUG ALLERGIES:   Allergies  Allergen Reactions  . Spiriva [Tiotropium Bromide Monohydrate] Other (See Comments)    Lung congestion  . Acetaminophen Hives, Nausea Only, Other (See Comments) and Nausea And Vomiting    +stomach problems   . Advair Diskus [Fluticasone-Salmeterol]     Lung congestion  . Hydrocodone-Acetaminophen Nausea Only  . Penicillins Hives, Rash and Other (See Comments)    Welts Hives, blisters Yes. hosp.    VITALS:  Blood pressure 107/70, pulse 69, temperature 97.5 F (36.4 C), temperature source Oral, resp. rate 20, height 5\' 10"  (1.778 m), weight 99.791 kg (220 lb), SpO2 98 %.  PHYSICAL EXAMINATION:   Physical Exam  GENERAL:  59 y.o.-year-old patient lying in the bed in mild respiratory distress.  EYES: Pupils equal, round, reactive to light and accommodation. No scleral icterus. Extraocular muscles intact.  HEENT: Head atraumatic, normocephalic. Oropharynx and nasopharynx clear.  NECK:  Supple, no jugular venous  distention. No thyroid enlargement, no tenderness.  LUNGS: Good air entry bilaterally. Diffuse insp/exp. wheezing, rhonchi bilaterally. No use of accessory muscles of respiration.  CARDIOVASCULAR: S1, S2 normal. No murmurs, rubs, or gallops.  ABDOMEN: Soft, nontender, nondistended. Bowel sounds present. No organomegaly or mass.  EXTREMITIES: No cyanosis, clubbing or edema b/l.    NEUROLOGIC: Cranial nerves II through XII are intact. No focal Motor or sensory deficits b/l.   PSYCHIATRIC: The patient is alert and oriented x 3. Anxious.  SKIN: No obvious rash, lesion, or ulcer.    LABORATORY PANEL:   CBC  Recent Labs Lab 06/18/15 0942  WBC 7.7  HGB 16.5  HCT 50.6  PLT 241   ------------------------------------------------------------------------------------------------------------------  Chemistries   Recent Labs Lab 06/18/15 0942  NA 137  K 3.7  CL 101  CO2 26  GLUCOSE 165*  BUN 8  CREATININE 0.95  CALCIUM 8.8*   ------------------------------------------------------------------------------------------------------------------  Cardiac Enzymes  Recent Labs Lab 06/18/15 0942  TROPONINI <0.03   ------------------------------------------------------------------------------------------------------------------  RADIOLOGY:  No results found.   ASSESSMENT AND PLAN:   59 year old male with past medical history of COPD, chronic pain, depression, neuropathy, history of anxiety, GERD, who presented to the hospital due to shortness of breath and noted to be in acute COPD exacerbation.  #1 COPD exacerbation-this is  due to underlying acute bronchitis, with ongoing tobacco abuse. -Continue IV steroids, around-the-clock nebulizer treatments, Pulmicort nebs, Zithromax. - slowly improving and will Assess for home oxygen prior to discharge.  #2 chronic pain-continue OxyContin, oxycodone, cont. lidocaine patch. - d/c IV Morphine.  -Patient does have high drug seeking  behavior.  #3 neuropathy-continue  Neurontin.  #4 depression-continue Prozac.  #5 tobacco abuse-continue nicotine patch.  Possible d/c home in next 1-2 days once resp. Status improves.   All the records are reviewed and case discussed with Care Management/Social Workerr. Management plans discussed with the patient, family and they are in agreement.  CODE STATUS: Full  DVT Prophylaxis: Lovenox  TOTAL TIME TAKING CARE OF THIS PATIENT: 25 minutes.   POSSIBLE D/C IN 1-2 DAYS, DEPENDING ON CLINICAL CONDITION.   Henreitta Leber M.D on 06/21/2015 at 1:49 PM  Between 7am to 6pm - Pager - [phone]  After 6pm go to [email or web address] - password [email or web address] [facility]  Office  [phone]  CC: Primary care physician; Marden Noble, MD

## 2015-06-21 NOTE — Plan of Care (Signed)
Problem: Activity: Goal: Risk for activity intolerance will decrease Outcome: Not Progressing Bedrest with bathroom priveleges

## 2015-06-22 LAB — CREATININE, SERUM
Creatinine, Ser: 0.86 mg/dL (ref 0.61–1.24)
GFR calc non Af Amer: >60 mL/min (ref >60)

## 2015-06-22 MED ORDER — AZITHROMYCIN 250 MG PO TABS
500.0000 mg | ORAL_TABLET | Freq: Every day | ORAL | Status: AC
  Administered 2015-06-22 – 2015-06-25 (×4): 500 mg via ORAL
  Filled 2015-06-22 (×4): qty 2

## 2015-06-22 NOTE — Progress Notes (Signed)
Pharmacy Antibiotic Follow-up Note  Steven Wiley. is a 59 y.o. year-old male admitted on 06/18/2015.  The patient is currently on day 4 of azithromycin for COPD exacerbation.  Assessment/Plan: After discussion with Dr. Verdell Carmine, the length of therapy for azithromycin for 7 is 7 days. The stop date is entered and will be after a total of 7 doses.  Temp (24hrs), Avg:97.9 F (36.6 C), Min:97.5 F (36.4 C), Max:98.2 F (36.8 C)   Recent Labs Lab 06/18/15 0942  WBC 7.7    Recent Labs Lab 06/18/15 0942 06/22/15 0627  CREATININE 0.95 0.86   Estimated Creatinine Clearance: 108.1 mL/min (by C-G formula based on Cr of 0.86).    Allergies  Allergen Reactions  . Spiriva [Tiotropium Bromide Monohydrate] Other (See Comments)    Lung congestion  . Acetaminophen Hives, Nausea Only, Other (See Comments) and Nausea And Vomiting    +stomach problems   . Advair Diskus [Fluticasone-Salmeterol]     Lung congestion  . Hydrocodone-Acetaminophen Nausea Only  . Penicillins Hives, Rash and Other (See Comments)    Welts Hives, blisters Yes. hosp.    Antimicrobials this admission: azithroymcin 12/22 >> 12/28   Levels/dose changes this admission:   Microbiology results: none  Thank you for allowing pharmacy to be a part of this patient's care.  Ramond Dial PharmD 06/22/2015 2:45 PM

## 2015-06-22 NOTE — Progress Notes (Addendum)
[place] at [place] NAME: Steven Wiley    MR#:  [id number]  DATE OF BIRTH:  1956-02-25  SUBJECTIVE:   Here due to shortness of breath, chest pain and noted to be in COPD exacerbation.  Continues to have significant wheezing and bronchospasm.  REVIEW OF SYSTEMS:    Review of Systems  Constitutional: Negative for fever and chills.  HENT: Negative for congestion and tinnitus.   Eyes: Negative for blurred vision and double vision.  Respiratory: Positive for cough, shortness of breath and wheezing.   Cardiovascular: Negative for chest pain, orthopnea and PND.  Gastrointestinal: Negative for nausea, vomiting, abdominal pain and diarrhea.  Genitourinary: Negative for dysuria and hematuria.  Neurological: Negative for dizziness, sensory change and focal weakness.  All other systems reviewed and are negative.   Nutrition: Heart healthy Tolerating Diet: Yes Tolerating PT: Await evaluation   DRUG ALLERGIES:   Allergies  Allergen Reactions  . Spiriva [Tiotropium Bromide Monohydrate] Other (See Comments)    Lung congestion  . Acetaminophen Hives, Nausea Only, Other (See Comments) and Nausea And Vomiting    +stomach problems   . Advair Diskus [Fluticasone-Salmeterol]     Lung congestion  . Hydrocodone-Acetaminophen Nausea Only  . Penicillins Hives, Rash and Other (See Comments)    Welts Hives, blisters Yes. hosp.    VITALS:  Blood pressure 123/61, pulse 86, temperature 97.5 F (36.4 C), temperature source Oral, resp. rate 18, height 5\' 10"  (1.778 m), weight 99.791 kg (220 lb), SpO2 95 %.  PHYSICAL EXAMINATION:   Physical Exam  GENERAL:  59 y.o.-year-old patient lying in the bed in mild respiratory distress.  EYES: Pupils equal, round, reactive to light and accommodation. No scleral icterus. Extraocular muscles intact.  HEENT: Head atraumatic, normocephalic. Oropharynx and nasopharynx clear.  NECK:  Supple, no jugular  venous distention. No thyroid enlargement, no tenderness.  LUNGS: Good air entry bilaterally. Diffuse insp/exp. wheezing, rhonchi bilaterally. No use of accessory muscles of respiration.  CARDIOVASCULAR: S1, S2 normal. No murmurs, rubs, or gallops.  ABDOMEN: Soft, nontender, nondistended. Bowel sounds present. No organomegaly or mass.  EXTREMITIES: No cyanosis, clubbing or edema b/l.    NEUROLOGIC: Cranial nerves II through XII are intact. No focal Motor or sensory deficits b/l.   PSYCHIATRIC: The patient is alert and oriented x 3. Anxious.  SKIN: No obvious rash, lesion, or ulcer.    LABORATORY PANEL:   CBC  Recent Labs Lab 06/18/15 0942  WBC 7.7  HGB 16.5  HCT 50.6  PLT 241   ------------------------------------------------------------------------------------------------------------------  Chemistries   Recent Labs Lab 06/18/15 0942 06/22/15 0627  NA 137  --   K 3.7  --   CL 101  --   CO2 26  --   GLUCOSE 165*  --   BUN 8  --   CREATININE 0.95 0.86  CALCIUM 8.8*  --    ------------------------------------------------------------------------------------------------------------------  Cardiac Enzymes  Recent Labs Lab 06/18/15 0942  TROPONINI <0.03   ------------------------------------------------------------------------------------------------------------------  RADIOLOGY:  No results found.   ASSESSMENT AND PLAN:   59 year old male with past medical history of COPD, chronic pain, depression, neuropathy, history of anxiety, GERD, who presented to the hospital due to shortness of breath and noted to be in acute COPD exacerbation.  #1 COPD exacerbation-this is  due to underlying acute bronchitis, with ongoing tobacco abuse.  -Continue IV steroids, around-the-clock nebulizer treatments, Pulmicort nebs, Zithromax. - very slow to improve despite getting maximal medical therapy. Will get Pulm  consult w/ Dr. Humphrey Rolls tomorrow.   #2 chronic pain-continue OxyContin,  oxycodone, cont. lidocaine patch. - d/c IV Morphine.  -Patient does have high drug seeking behavior.  #3 neuropathy-continue Neurontin.  #4 depression-continue Prozac.  #5 tobacco abuse-continue nicotine patch.  Will get PT eval today.   Possible d/c home in next 1-2 days once resp. Status improves.   All the records are reviewed and case discussed with Care Management/Social Workerr. Management plans discussed with the patient, family and they are in agreement.  CODE STATUS: Full  DVT Prophylaxis: Lovenox  TOTAL TIME TAKING CARE OF THIS PATIENT: 25 minutes.   POSSIBLE D/C IN 1-2 DAYS, DEPENDING ON CLINICAL CONDITION.   Henreitta Leber M.D on 06/22/2015 at 3:01 PM  Between 7am to 6pm - Pager - [phone]  After 6pm go to [email or web address] - password [email or web address] [facility]  Office  [phone]  CC: Primary care physician; Marden Noble, MD

## 2015-06-22 NOTE — Plan of Care (Signed)
Problem: Activity: Goal: Risk for activity intolerance will decrease Outcome: Not Progressing Pt still experiencing SOB w/ exertion

## 2015-06-22 NOTE — Care Management Note (Signed)
Case Management Note  Patient Details  Name: Steven LYKE Sr. MRN: [id number] Date of Birth: [birth year]  Subjective/Objective:     PT consult ordered today. Weaned off MSO4. Still on oxycodone for chronic pain issues.                Action/Plan:   Expected Discharge Date:                  Expected Discharge Plan:     In-House Referral:     Discharge planning Services     Post Acute Care Choice:    Choice offered to:     DME Arranged:    DME Agency:     HH Arranged:    [place] Agency:     Status of Service:     Medicare Important Message Given:    Date Medicare IM Given:    Medicare IM give by:    Date Additional Medicare IM Given:    Additional Medicare Important Message give by:     If discussed at [place] of Stay Meetings, dates discussed:    Additional Comments:  Kesia Dalto A, RN 06/22/2015, 4:43 PM

## 2015-06-23 ENCOUNTER — Inpatient Hospital Stay: Payer: Medicaid Other

## 2015-06-23 MED ORDER — FUROSEMIDE 10 MG/ML IJ SOLN
40.0000 mg | Freq: Once | INTRAMUSCULAR | Status: AC
  Administered 2015-06-23: 40 mg via INTRAVENOUS
  Filled 2015-06-23: qty 4
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
## 2015-06-23 NOTE — Evaluation (Signed)
Physical Therapy Evaluation Patient Details Name: Steven UPSHAW Sr. MRN: [id number] DOB: [birth year] Today's Date: 06/23/2015   History of Present Illness  presented to ER secondary to chest pain and SOB; admitted with acute on chronic respiratory failure secondary to COPD exacerbation.  PMH significant for HTN, COPD, neuropathy and chronic back pain.  Clinical Impression  Upon evaluation, patient alert and oriented to basic information; follows all simple commands.  Generally hyper-verbal and very perseverative on multiple medical conditions.  Bilat UE/LE strength globally weak and deconditioned, but functional for basic transfers and mobility.  Able to complete bed mobility, sit/stand, basic transfers and gait (3 lateral steps) with RW, min assist.  Significant SOB with minimal activity, though maintains sats >93% on 3L supplemental O2.  Unable to tolerate additional mobility, unable to demonstrate cardiopulmonary endurance necessary to facilitate safe discharge home alone. Would benefit from skilled PT to address above deficits and promote optimal return to PLOF; recommend transition to STR upon discharge from acute hospitalization.     Follow Up Recommendations SNF    Equipment Recommendations       Recommendations for Other Services       Precautions / Restrictions Precautions Precautions: Fall Restrictions Weight Bearing Restrictions: No      Mobility  Bed Mobility Overal bed mobility: Needs Assistance Bed Mobility: Supine to Sit     Supine to sit: Min assist        Transfers Overall transfer level: Needs assistance Equipment used: Rolling walker (2 wheeled) Transfers: Sit to/from Stand Sit to Stand: Min assist            Ambulation/Gait Ambulation/Gait assistance: Min assist Ambulation Distance (Feet): 3 Feet Assistive device: Rolling walker (2 wheeled)       General Gait Details: lateral stepping edge of bed with RW, very short, shuffling steps with  poor endurance/activity tolerance.  Maintains sats >93%, but significant DOE; unable to tolerate additional distance due to SOB/fatigue.  Stairs            Wheelchair Mobility    Modified Rankin (Stroke Patients Only)       Balance Overall balance assessment: Needs assistance Sitting-balance support: No upper extremity supported;Feet supported Sitting balance-Leahy Scale: Good     Standing balance support: Bilateral upper extremity supported Standing balance-Leahy Scale: Fair                               Pertinent Vitals/Pain Pain Assessment: 0-10 Pain Score: 5  Pain Descriptors / Indicators: Aching Pain Intervention(s): Limited activity within patient's tolerance;Monitored during session;Repositioned    Home Living Family/patient expects to be discharged to:: Private residence Living Arrangements: Alone   Type of Home: House Home Access: Stairs to enter Entrance Stairs-Rails: Right Entrance Stairs-Number of Steps: 5-6 Home Layout: One level Home Equipment: Environmental consultant - 2 wheels;Walker - 4 wheels;Cane - single point      Prior Function Level of Independence: Independent with assistive device(s)         Comments: Frequent use of rollator due to chronic back pain; "manages" ADLs without assist.  Does endorse at least one fall in previous six months.  Of note, patient with recent rehab stay (discharged home Oct 2016 per patient report)     Hand Dominance        Extremity/Trunk Assessment   Upper Extremity Assessment: Overall WFL for tasks assessed           Lower Extremity Assessment: Overall  WFL for tasks assessed (globally at least 3/5 throughout bilat LEs; baseline neuropathy mid-thighs distally)         Communication   Communication: No difficulties  Cognition Arousal/Alertness: Awake/alert Behavior During Therapy: Anxious Overall Cognitive Status: Within Functional Limits for tasks assessed                      General  Comments      Exercises        Assessment/Plan    PT Assessment Patient needs continued PT services  PT Diagnosis Difficulty walking;Generalized weakness   PT Problem List Decreased strength;Decreased range of motion;Decreased activity tolerance;Decreased balance;Decreased mobility;Decreased coordination;Decreased knowledge of use of DME;Decreased safety awareness;Decreased knowledge of precautions;Cardiopulmonary status limiting activity  PT Treatment Interventions DME instruction;Gait training;Stair training;Functional mobility training;Therapeutic activities;Therapeutic exercise;Balance training;Patient/family education   PT Goals (Current goals can be found in the Care Plan section) Acute Rehab PT Goals Patient Stated Goal: "to get to a facility and get stronger before I go home by myself" PT Goal Formulation: With patient Time For Goal Achievement: 07/07/15 Potential to Achieve Goals: Good    Frequency Min 2X/week   Barriers to discharge Decreased caregiver support;Inaccessible home environment      Co-evaluation               End of Session Equipment Utilized During Treatment: Gait belt Activity Tolerance: Patient tolerated treatment well Patient left: in bed;with bed alarm set;with call bell/phone within reach           Time: 1029-1044 PT Time Calculation (min) (ACUTE ONLY): 15 min   Charges:   PT Evaluation $Initial PT Evaluation Tier I: 1 Procedure     PT G Codes:       Xena Propst H. Owens Shark, PT, DPT, NCS 06/23/2015, 11:10 AM [phone]

## 2015-06-23 NOTE — Progress Notes (Signed)
Found patient to be SOB with audible coarse crackles on the right side. Informed RN Jeanette Caprice of pt condition. RR 20, SPO2 93% on 2L [place].

## 2015-06-23 NOTE — Progress Notes (Signed)
MD notified of pt SOB and current respiratory condition. RT reports pt sounds wet on right side. IV lasix 40mg  x 1 ordered and routine CXR ordered for am also. Will give and continue to monitor.

## 2015-06-23 NOTE — Progress Notes (Signed)
[place] at [place] NAME: Aziel Schad    MR#:  [id number]  DATE OF BIRTH:  1956-04-30  SUBJECTIVE:   Here due to shortness of breath, chest pain and noted to be in COPD exacerbation.  Continues to have significant wheezing and bronchospasm.  REVIEW OF SYSTEMS:    Review of Systems  Constitutional: Negative for fever and chills.  HENT: Negative for congestion and tinnitus.   Eyes: Negative for blurred vision and double vision.  Respiratory: Positive for cough, shortness of breath and wheezing.   Cardiovascular: Negative for chest pain, orthopnea and PND.  Gastrointestinal: Negative for nausea, vomiting, abdominal pain and diarrhea.  Genitourinary: Negative for dysuria and hematuria.  Musculoskeletal: Positive for back pain.  Neurological: Negative for dizziness, sensory change and focal weakness.  All other systems reviewed and are negative.   Nutrition: Heart healthy Tolerating Diet: Yes Tolerating PT: Await evaluation   DRUG ALLERGIES:   Allergies  Allergen Reactions  . Spiriva [Tiotropium Bromide Monohydrate] Other (See Comments)    Lung congestion  . Acetaminophen Hives, Nausea Only, Other (See Comments) and Nausea And Vomiting    +stomach problems   . Advair Diskus [Fluticasone-Salmeterol]     Lung congestion  . Hydrocodone-Acetaminophen Nausea Only  . Penicillins Hives, Rash and Other (See Comments)    Welts Hives, blisters Yes. hosp.    VITALS:  Blood pressure 121/60, pulse 83, temperature 98.5 F (36.9 C), temperature source Oral, resp. rate 19, height 5\' 10"  (1.778 m), weight 99.791 kg (220 lb), SpO2 94 %.  PHYSICAL EXAMINATION:   Physical Exam  Constitutional: He is oriented to person, place, and time and well-developed, well-nourished, and in no distress. No distress.  HENT:  Head: Normocephalic.  Eyes: No scleral icterus.  Neck: Normal range of motion. Neck supple. No JVD present. No tracheal  deviation present.  Cardiovascular: Normal rate, regular rhythm and normal heart sounds.  Exam reveals no gallop and no friction rub.   No murmur heard. Pulmonary/Chest: Effort normal. No respiratory distress. He has wheezes. He has no rales. He exhibits no tenderness.  Abdominal: Soft. Bowel sounds are normal. He exhibits no distension and no mass. There is no tenderness. There is no rebound and no guarding.  Musculoskeletal: Normal range of motion. He exhibits no edema.  Neurological: He is alert and oriented to person, place, and time.  Skin: Skin is warm. No rash noted. No erythema.  Psychiatric: Affect and judgment normal.      LABORATORY PANEL:   CBC  Recent Labs Lab 06/18/15 0942  WBC 7.7  HGB 16.5  HCT 50.6  PLT 241   ------------------------------------------------------------------------------------------------------------------  Chemistries   Recent Labs Lab 06/18/15 0942 06/22/15 0627  NA 137  --   K 3.7  --   CL 101  --   CO2 26  --   GLUCOSE 165*  --   BUN 8  --   CREATININE 0.95 0.86  CALCIUM 8.8*  --    ------------------------------------------------------------------------------------------------------------------  Cardiac Enzymes  Recent Labs Lab 06/18/15 0942  TROPONINI <0.03   ------------------------------------------------------------------------------------------------------------------  RADIOLOGY:  Dg Chest Port 1 View  06/23/2015  CLINICAL DATA:  Underlying COPD with rales on physical examination EXAM: PORTABLE CHEST 1 VIEW COMPARISON:  June 18, 2015 chest radiograph and chest CT June 18, 2015 ; chest radiograph May 20, 2014 FINDINGS: There is underlying emphysematous change, better appreciable on recent chest CT examination. There is mild scarring in the bases. There is  asymmetric apical pleural thickening on the right, stable. There is no edema or consolidation. The heart size and pulmonary vascular normal. No  adenopathy. No bone lesions. IMPRESSION: Stable underlying emphysematous change. Stable asymmetric right apical pleural thickening. No edema or consolidation. Electronically Signed   By: Lowella Grip III M.D.   On: 06/23/2015 07:31     ASSESSMENT AND PLAN:   59 year old male with past medical history of COPD, chronic pain, depression, neuropathy, history of anxiety, GERD, who presented to the hospital due to shortness of breath and noted to be in acute COPD exacerbation.  1 acute COPD exacerbation: Patient is very slow to respond. Patient continues to be on high-dose steroids, oxygen, nebulizer and inhalerS. His pulmonologist has been consulted. Further recommendations as per pulmonary consultation. CT chest on admission showed no evidence of pneumonia.   2 chronic pain-continue OxyContin, oxycodone, cont. lidocaine patch. Patient does have high drug seeking behavior.  he is followed by pain clinic.  3 neuropathy-continue Neurontin.  4 depression-continue Prozac.  5 tobacco abuse-continue nicotine patch.  Physical therapy is recommending skilled nursing facility at discharge   Management plans discussed with the patient and he is in agreement  CODE STATUS: Full  DVT Prophylaxis: Lovenox  TOTAL TIME TAKING CARE OF THIS PATIENT: 25 minutes.   POSSIBLE D/C IN ??, DEPENDING ON CLINICAL CONDITION.   Zamantha Strebel M.D on 06/23/2015 at 12:11 PM  Between 7am to 6pm - Pager - [phone]  After 6pm go to [email or web address] - password [email or web address] [facility]  Office  [phone]  CC: Primary care physician; Marden Noble, MD

## 2015-06-24 ENCOUNTER — Inpatient Hospital Stay: Payer: Medicaid Other

## 2015-06-24 LAB — CBC
HCT: 44.3 % (ref 40.0–52.0)
Hemoglobin: 14.5 g/dL (ref 13.0–18.0)
MCH: 29.9 pg (ref 26.0–34.0)
MCHC: 32.8 g/dL (ref 32.0–36.0)
MCV: 91.4 fL (ref 80.0–100.0)
PLATELETS: 144 10*3/uL — AB (ref 150–440)
RBC: 4.84 MIL/uL (ref 4.40–5.90)
RDW: 14.8 % — AB (ref 11.5–14.5)
WBC: 7.2 10*3/uL (ref 3.8–10.6)

## 2015-06-24 LAB — GLUCOSE, CAPILLARY
GLUCOSE-CAPILLARY: 230 mg/dL — AB (ref 65–99)
Glucose-Capillary: 236 mg/dL — ABNORMAL HIGH (ref 65–99)
Glucose-Capillary: 272 mg/dL — ABNORMAL HIGH (ref 65–99)

## 2015-06-24 LAB — BASIC METABOLIC PANEL
Anion gap: 6 (ref 5–15)
BUN: 35 mg/dL — AB (ref 6–20)
CHLORIDE: 98 mmol/L — AB (ref 101–111)
CO2: 32 mmol/L (ref 22–32)
CREATININE: 0.99 mg/dL (ref 0.61–1.24)
Calcium: 8.4 mg/dL — ABNORMAL LOW (ref 8.9–10.3)
GFR calc Af Amer: >60 mL/min (ref >60)
GFR calc non Af Amer: >60 mL/min (ref >60)
Glucose, Bld: 262 mg/dL — ABNORMAL HIGH (ref 65–99)
Potassium: 4.4 mmol/L (ref 3.5–5.1)
SODIUM: 136 mmol/L (ref 135–145)

## 2015-06-24 MED ORDER — FUROSEMIDE 10 MG/ML IJ SOLN
40.0000 mg | Freq: Every day | INTRAMUSCULAR | Status: DC
  Administered 2015-06-24 – 2015-06-25 (×2): 40 mg via INTRAVENOUS
  Filled 2015-06-24 (×2): qty 4

## 2015-06-24 MED ORDER — INSULIN ASPART 100 UNIT/ML ~~LOC~~ SOLN
0.0000 [IU] | Freq: Three times a day (TID) | SUBCUTANEOUS | Status: DC
  Administered 2015-06-24 – 2015-06-25 (×3): 3 [IU] via SUBCUTANEOUS
  Administered 2015-06-25: 2 [IU] via SUBCUTANEOUS
  Filled 2015-06-24: qty 2
  Filled 2015-06-24 (×3): qty 3

## 2015-06-24 MED ORDER — INSULIN ASPART 100 UNIT/ML ~~LOC~~ SOLN
0.0000 [IU] | Freq: Every day | SUBCUTANEOUS | Status: DC
  Administered 2015-06-24: 3 [IU] via SUBCUTANEOUS
  Filled 2015-06-24: qty 3

## 2015-06-24 NOTE — Progress Notes (Signed)
Inpatient Diabetes Program Recommendations  AACE/ADA: New Consensus Statement on Inpatient Glycemic Control (2015)  Target Ranges:  Prepandial:   less than 140 mg/dL      Peak postprandial:   less than 180 mg/dL (1-2 hours)      Critically ill patients:  140 - 180 mg/dL  Results for Steven Wiley, Steven SR. (MRN [id number]) as of 06/24/2015 09:50  Ref. Range 06/18/2015 09:42 06/24/2015 05:51  Glucose Latest Ref Range: 65-99 mg/dL 165 (H) 262 (H)   Review of Glycemic Control  Diabetes history: NO Outpatient Diabetes medications: NA Current orders for Inpatient glycemic control: None  Inpatient Diabetes Program Recommendations: Correction (SSI): Patient has no history of diabetes. Lab glucose 262 mg/dl this morning. Patient is ordered Solumedrol 60 mg Q6H which is likely cause of hyperglycemia. While inpatient and ordered steroids, please consider ordering CBGs with Novolog correction scale ACHS.  Thanks, Barnie Alderman, RN, MSN, CDE Diabetes Coordinator Inpatient Diabetes Program [phone] (Team Pager from [place] to [place]) [phone] ([facility] office) [phone] [facility] office) [phone] [facility] office)

## 2015-06-24 NOTE — NC FL2 (Addendum)
[place] LEVEL OF CARE SCREENING TOOL     IDENTIFICATION  Patient Name: Steven BALDI Sr. Birthdate: Nov 25, 1955 Sex: male Admission Date (Current Location): 06/18/2015  [place] and [place] Number:  Engineering geologist and Address:  [facility], [address] [place], [place] [address]      Provider Number: [id number]  Attending Physician Name and Address:  Bettey Costa, MD  Relative Name and Phone Number:       Current Level of Care: Hospital Recommended Level of Care: [place] Prior Approval Number:    Date Approved/Denied:   PASRR Number: [id number]  Discharge Plan: SNF    Current Diagnoses: Patient Active Problem List   Diagnosis Date Noted  . Chest pain 06/18/2015  . Intractable back pain 04/20/2015  . Acute on chronic respiratory failure with hypoxemia ([place]) 01/03/2015  . Alcohol dependence with uncomplicated withdrawal ([place])   . Major depressive disorder, recurrent, severe without psychotic features ([place])   . Uncomplicated alcohol dependence ([place])   . MDD (major depressive disorder), recurrent episode, severe ([place]) 06/24/2014  . Alcohol abuse 06/24/2014  . Alcohol dependence ([place]) 06/24/2014  . Alcohol dependence with withdrawal, uncomplicated ([place]) [id number]  . Major depression, single episode 06/23/2014  . Suicidal ideations 06/23/2014  . Laryngeal pain 04/30/2014  . Neurosis, posttraumatic 02/24/2014  . Back ache 02/22/2014  . Chronic depression 02/22/2014  . Personal history of traumatic fracture 02/22/2014  . Coughing 01/23/2013  . COPD (chronic obstructive pulmonary disease) ([place]) 11/13/2012  . Abnormal CXR 11/13/2012  . Drug-seeking behavior 11/13/2012  . Odynophagia 10/31/2012  . Oral thrush 10/31/2012  . Acute-on-chronic respiratory failure ([place]) 10/29/2012  . COPD exacerbation ([place]) 10/28/2012  . Adrenal insufficiency ([place]) 10/28/2012  . Acute adrenal crisis ([place]) 10/28/2012  .  Pituitary adenoma ([place]) 10/28/2012  . Anxiety disorder 10/28/2012    Orientation RESPIRATION BLADDER Height & Weight    Self, Time, Situation, Place  O2 (2 liters) Continent   220 lbs.  BEHAVIORAL SYMPTOMS/MOOD NEUROLOGICAL BOWEL NUTRITION STATUS   (none)   Continent Diet  AMBULATORY STATUS COMMUNICATION OF NEEDS Skin   Limited Assist Verbally Normal                       Personal Care Assistance Level of Assistance  Bathing, Dressing Bathing Assistance: Limited assistance   Dressing Assistance: Limited assistance     Functional Limitations Info             SPECIAL CARE FACTORS FREQUENCY  PT (By licensed PT)                    Contractures Contractures Info: Not present    Additional Factors Info  Code Status Code Status Info: Full             Current Medications (06/24/2015):  This is the current hospital active medication list Current Facility-Administered Medications  Medication Dose Route Frequency Provider Last Rate Last Dose  . acetaminophen (TYLENOL) tablet 650 mg  650 mg Oral Q4H PRN Aruna Gouru, MD      . albuterol (PROVENTIL) (2.5 MG/3ML) 0.083% nebulizer solution 2.5 mg  2.5 mg Nebulization Q4H PRN Nicholes Mango, MD      . aspirin EC tablet 325 mg  325 mg Oral Daily Nicholes Mango, MD   325 mg at 06/24/15 0918  . azithromycin (ZITHROMAX) tablet 500 mg  500 mg Oral Daily Henreitta Leber, MD   500 mg at  06/23/15 1839  . benzonatate (TESSALON) capsule 200 mg  200 mg Oral TID PRN Nicholes Mango, MD   200 mg at 06/24/15 0918  . budesonide (PULMICORT) nebulizer solution 0.25 mg  0.25 mg Nebulization BID Henreitta Leber, MD   0.25 mg at 06/24/15 0755  . enoxaparin (LOVENOX) injection 40 mg  40 mg Subcutaneous Q24H Nicholes Mango, MD   40 mg at 06/23/15 1839  . FLUoxetine (PROZAC) capsule 40 mg  40 mg Oral Daily Ahmed Prima, MD   40 mg at 06/24/15 [id number]  . furosemide (LASIX) injection 40 mg  40 mg Intravenous Daily Bettey Costa, MD   40 mg at 06/24/15 1212   . gabapentin (NEURONTIN) capsule 600 mg  600 mg Oral QID Nicholes Mango, MD   600 mg at 06/24/15 0918  . insulin aspart (novoLOG) injection 0-5 Units  0-5 Units Subcutaneous QHS Sital Mody, MD      . insulin aspart (novoLOG) injection 0-9 Units  0-9 Units Subcutaneous TID WC Bettey Costa, MD   3 Units at 06/24/15 1219  . ipratropium-albuterol (DUONEB) 0.5-2.5 (3) MG/3ML nebulizer solution 3 mL  3 mL Nebulization Q4H Henreitta Leber, MD   3 mL at 06/24/15 1147  . lidocaine (LIDODERM) 5 % 1 patch  1 patch Transdermal Q24H Nicholes Mango, MD   1 patch at 06/24/15 0925  . loratadine (CLARITIN) tablet 10 mg  10 mg Oral Daily Nicholes Mango, MD   10 mg at 06/24/15 0918  . methylPREDNISolone sodium succinate (SOLU-MEDROL) 125 mg/2 mL injection 60 mg  60 mg Intravenous Q6H Henreitta Leber, MD   60 mg at 06/24/15 0919  . multivitamin with minerals tablet 1 tablet  1 tablet Oral Daily Nicholes Mango, MD   1 tablet at 06/24/15 [phone]  . neomycin-polymyxin b-dexamethasone (MAXITROL) ophthalmic suspension 1 drop  1 drop Both Eyes QID Nicholes Mango, MD   1 drop at 06/24/15 1351  . nicotine (NICODERM CQ - dosed in mg/24 hours) patch 14 mg  14 mg Transdermal Daily Nicholes Mango, MD   14 mg at 06/24/15 0918  . ondansetron (ZOFRAN) injection 4 mg  4 mg Intravenous Q6H PRN Nicholes Mango, MD   4 mg at 06/18/15 2229  . oxyCODONE (Oxy IR/ROXICODONE) immediate release tablet 10 mg  10 mg Oral Q4H PRN Henreitta Leber, MD   10 mg at 06/24/15 1349  . oxyCODONE (OXYCONTIN) 12 hr tablet 20 mg  20 mg Oral Q12H Nicholes Mango, MD   20 mg at 06/24/15 0918  . polyethylene glycol (MIRALAX / GLYCOLAX) packet 17 g  17 g Oral Daily PRN Nicholes Mango, MD   17 g at 06/22/15 1528  . prazosin (MINIPRESS) capsule 1 mg  1 mg Oral QHS Nicholes Mango, MD   1 mg at 06/23/15 2132  . ramelteon (ROZEREM) tablet 8 mg  8 mg Oral QHS Lance Coon, MD   8 mg at 06/23/15 2132  . senna-docusate (Senokot-S) tablet 1 tablet  1 tablet Oral QHS PRN Nicholes Mango, MD   1 tablet at  06/22/15 2144  . traZODone (DESYREL) tablet 200 mg  200 mg Oral QHS Nicholes Mango, MD   200 mg at 06/23/15 2132     Discharge Medications: Please see discharge summary for a list of discharge medications.  Relevant Imaging Results:  Relevant Lab Results:   Additional Information SS: [id number]  Shela Leff, LCSW

## 2015-06-24 NOTE — Progress Notes (Signed)
[place] [facility] [place] NAME: Marquavis Mardis    MR#:  [id number]  DATE OF BIRTH:  [birth year]  SUBJECTIVE:   Continues to have shortness of breath and wheezing with frothy sputum today. Slightly blood-tinged REVIEW OF SYSTEMS:    Review of Systems  Constitutional: Negative for fever and chills.  HENT: Negative for congestion and tinnitus.   Eyes: Negative for blurred vision and double vision.  Respiratory: Positive for cough, shortness of breath and wheezing.   Cardiovascular: Negative for chest pain, orthopnea and PND.  Gastrointestinal: Negative for nausea, vomiting, abdominal pain and diarrhea.  Genitourinary: Negative for dysuria and hematuria.  Musculoskeletal: Positive for back pain.  Neurological: Negative for dizziness, sensory change and focal weakness.  All other systems reviewed and are negative.   Nutrition: Heart healthy Tolerating Diet: Yes   DRUG ALLERGIES:   Allergies  Allergen Reactions  . Spiriva [Tiotropium Bromide Monohydrate] Other (See Comments)    Lung congestion  . Acetaminophen Hives, Nausea Only, Other (See Comments) and Nausea And Vomiting    +stomach problems   . Advair Diskus [Fluticasone-Salmeterol]     Lung congestion  . Hydrocodone-Acetaminophen Nausea Only  . Penicillins Hives, Rash and Other (See Comments)    Welts Hives, blisters Yes. hosp.    VITALS:  Blood pressure 124/52, pulse 81, temperature 98.1 F (36.7 C), temperature source Oral, resp. rate 20, height 5\' 10"  (1.778 m), weight 99.791 kg (220 lb), SpO2 95 %.  PHYSICAL EXAMINATION:   Physical Exam  Constitutional: He is oriented to person, place, and time and well-developed, well-nourished, and in no distress. No distress.  HENT:  Head: Normocephalic.  Eyes: No scleral icterus.  Neck: Normal range of motion. Neck supple. No JVD present. No tracheal deviation present.  Cardiovascular: Normal rate, regular rhythm and normal  heart sounds.  Exam reveals no gallop and no friction rub.   No murmur heard. Pulmonary/Chest: Effort normal. No respiratory distress. Wheezes: Patient with diffuse expiratory wheezing and prolonged wheezing on expiration. He has no rales. He exhibits no tenderness.  Abdominal: Soft. Bowel sounds are normal. He exhibits no distension and no mass. There is no tenderness. There is no rebound and no guarding.  Musculoskeletal: Normal range of motion. He exhibits no edema.  Neurological: He is alert and oriented to person, place, and time.  Skin: Skin is warm. No rash noted. No erythema.  Psychiatric: Affect and judgment normal.      LABORATORY PANEL:   CBC  Recent Labs Lab 06/24/15 0551  WBC 7.2  HGB 14.5  HCT 44.3  PLT 144*   ------------------------------------------------------------------------------------------------------------------  Chemistries   Recent Labs Lab 06/24/15 0551  NA 136  K 4.4  CL 98*  CO2 32  GLUCOSE 262*  BUN 35*  CREATININE 0.99  CALCIUM 8.4*   ------------------------------------------------------------------------------------------------------------------  Cardiac Enzymes  Recent Labs Lab 06/18/15 0942  TROPONINI <0.03   ------------------------------------------------------------------------------------------------------------------  RADIOLOGY:  Dg Chest Port 1 View  06/23/2015  CLINICAL DATA:  Underlying COPD with rales on physical examination EXAM: PORTABLE CHEST 1 VIEW COMPARISON:  June 18, 2015 chest radiograph and chest CT June 18, 2015 ; chest radiograph May 20, 2014 FINDINGS: There is underlying emphysematous change, better appreciable on recent chest CT examination. There is mild scarring in the bases. There is asymmetric apical pleural thickening on the right, stable. There is no edema or consolidation. The heart size and pulmonary vascular normal. No adenopathy. No bone lesions. IMPRESSION: Stable underlying  emphysematous change. Stable asymmetric right apical pleural thickening. No edema or consolidation. Electronically Signed   By: Lowella Grip III M.D.   On: 06/23/2015 07:31     ASSESSMENT AND PLAN:   59 year old male with past medical history of COPD, chronic pain, depression, neuropathy, history of anxiety, GERD, who presented to the hospital due to shortness of breath and noted to be in acute COPD exacerbation.  1 acute COPD exacerbation: Patient is very slow to respond. Patient continues to be on high-dose steroids, oxygen, nebulizer and inhalerS. His pulmonologist has been consulted. Further recommendations as per pulmonary consultation. CT chest on admission showed no evidence of pneumonia. Due to hemoptysis and frothy sputum I will order chest x-ray and start Lasix 40 IV every 12 hours. Echocardiogram on December 23 showed normal ejection fraction no mention of diastolic dysfunction.  2 chronic pain-continue OxyContin, oxycodone, cont. lidocaine patch. Patient does have high drug seeking behavior.  he is followed by pain clinic.  3 neuropathy-continue Neurontin.  4 depression-continue Prozac.  5 tobacco abuse-continue nicotine patch.  Physical therapy is recommending skilled nursing facility at discharge   Management plans discussed with the patient and he is in agreement  CODE STATUS: Full  DVT Prophylaxis: Lovenox  TOTAL TIME TAKING CARE OF THIS PATIENT: 25 minutes.   POSSIBLE D/C IN ??, DEPENDING ON CLINICAL CONDITION.   Naliah Eddington M.D on 06/24/2015 at 10:04 AM  Between 7am to 6pm - Pager - [phone]  After 6pm go to [email or web address] - password [email or web address] [facility]  Office  [phone]  CC: Primary care physician; Marden Noble, MD

## 2015-06-24 NOTE — Clinical Social Work Note (Signed)
Clinical Social Work Assessment  Patient Details  Name: Steven HANCOX Sr. MRN: [id number] Date of Birth: 1956-06-20  Date of referral:  06/24/15               Reason for consult:  Facility Placement                Permission sought to share information with:  Chartered certified accountant granted to share information::  Yes, Verbal Permission Granted  Name::        Agency::     Relationship::     Contact Information:     Housing/Transportation Living arrangements for the past 2 months:  Single Family Home Source of Information:  Patient Patient Interpreter Needed:  None Criminal Activity/Legal Involvement Pertinent to Current Situation/Hospitalization:  No - Comment as needed Significant Relationships:  Friend Lives with:  Self Do you feel safe going back to the place where you live?    Need for family participation in patient care:     Care giving concerns:  Patient resides alone   Facilities manager / plan:  Patient is known to [facility] [place] and has been placed out of county for [facility] (due to [facility] limited beds) in the past. Patient is stating that he left [facility] [place] in October and thought he could manage but cannot. Patient is aware of placement process and is aware he may have to go out of county. Patient has a male friend as a support system but she has health problems and her support is limited. Bedsearch initiated today.  Employment status:  Disabled (Comment on whether or not currently receiving Disability) Insurance information:  Medicaid In [place] PT Recommendations:  [place] / Referral to community resources:     Patient/Family's Response to care:  Patient expressed appreciation for CSW assistance.   Patient/Family's Understanding of and Emotional Response to Diagnosis, Current Treatment, and Prognosis:  Patient reports that he knows he may have to go out of county and is aware of his limitations.  Emotional  Assessment Appearance:  Appears stated age Attitude/Demeanor/Rapport:   (pleasant and cooperative) Affect (typically observed):  Accepting, Appropriate, Calm Orientation:  Oriented to Self, Oriented to Place, Oriented to  Time, Oriented to Situation Alcohol / Substance use:  Not Applicable Psych involvement (Current and /or in the community):  No (Comment)  Discharge Needs  Concerns to be addressed:  Care Coordination Readmission within the last 30 days:  No Current discharge risk:  None Barriers to Discharge:  No Barriers Identified   Shela Leff, LCSW 06/24/2015, 12:39 PM

## 2015-06-25 LAB — GLUCOSE, CAPILLARY
GLUCOSE-CAPILLARY: 203 mg/dL — AB (ref 65–99)
GLUCOSE-CAPILLARY: 248 mg/dL — AB (ref 65–99)
GLUCOSE-CAPILLARY: 175 mg/dL — AB (ref 65–99)
Glucose-Capillary: 189 mg/dL — ABNORMAL HIGH (ref 65–99)

## 2015-06-25 LAB — CREATININE, SERUM
Creatinine, Ser: 0.93 mg/dL (ref 0.61–1.24)
GFR calc non Af Amer: >60 mL/min (ref >60)

## 2015-06-25 MED ORDER — METHYLPREDNISOLONE SODIUM SUCC 125 MG IJ SOLR
60.0000 mg | Freq: Three times a day (TID) | INTRAMUSCULAR | Status: DC
  Administered 2015-06-25 – 2015-06-29 (×12): 60 mg via INTRAVENOUS
  Filled 2015-06-25 (×14): qty 2

## 2015-06-25 MED ORDER — INSULIN ASPART 100 UNIT/ML ~~LOC~~ SOLN
0.0000 [IU] | Freq: Three times a day (TID) | SUBCUTANEOUS | Status: DC
  Administered 2015-06-25: 5 [IU] via SUBCUTANEOUS
  Administered 2015-06-26 (×3): 3 [IU] via SUBCUTANEOUS
  Administered 2015-06-27: 5 [IU] via SUBCUTANEOUS
  Administered 2015-06-27 – 2015-06-30 (×9): 3 [IU] via SUBCUTANEOUS
  Filled 2015-06-25 (×7): qty 3
  Filled 2015-06-25: qty 5
  Filled 2015-06-25: qty 8
  Filled 2015-06-25 (×6): qty 3
  Filled 2015-06-25: qty 2

## 2015-06-25 MED ORDER — BUDESONIDE 0.5 MG/2ML IN SUSP
0.5000 mg | Freq: Two times a day (BID) | RESPIRATORY_TRACT | Status: DC
  Administered 2015-06-25 – 2015-06-30 (×10): 0.5 mg via RESPIRATORY_TRACT
  Filled 2015-06-25 (×10): qty 2

## 2015-06-25 MED ORDER — INSULIN ASPART 100 UNIT/ML ~~LOC~~ SOLN
0.0000 [IU] | Freq: Every day | SUBCUTANEOUS | Status: DC
  Administered 2015-06-26: 3 [IU] via SUBCUTANEOUS
  Administered 2015-06-27: 2 [IU] via SUBCUTANEOUS
  Filled 2015-06-25: qty 4
  Filled 2015-06-25: qty 2

## 2015-06-25 NOTE — Progress Notes (Signed)
Was notified by respiratory therapy that patient may have been smoking in the bathroom.  Went into talk with patient and he denied smoking.  Steven Wiley even became tearful, stating that he "promise" the he "was not smoking."  His primary RN was notified of this.  Will continue to monitor.

## 2015-06-25 NOTE — Progress Notes (Signed)
[place] at [place] NAME: Steven Wiley    MR#:  [id number]  DATE OF BIRTH:  Mar 13, 1956  SUBJECTIVE:   Patient reports that he is not feeling well and is weak. Patient continues to have some wheezing however a bit improved since yesterday. REVIEW OF SYSTEMS:    Review of Systems  Constitutional: Positive for malaise/fatigue. Negative for fever and chills.  HENT: Negative for congestion and tinnitus.   Eyes: Negative for blurred vision and double vision.  Respiratory: Positive for cough, shortness of breath and wheezing.   Cardiovascular: Negative for chest pain, orthopnea and PND.  Gastrointestinal: Negative for nausea, vomiting, abdominal pain and diarrhea.  Genitourinary: Negative for dysuria and hematuria.  Musculoskeletal: Negative for back pain.  Neurological: Positive for weakness. Negative for dizziness, sensory change and focal weakness.  All other systems reviewed and are negative.   Nutrition: Heart healthy Tolerating Diet: Yes   DRUG ALLERGIES:   Allergies  Allergen Reactions  . Spiriva [Tiotropium Bromide Monohydrate] Other (See Comments)    Lung congestion  . Acetaminophen Hives, Nausea Only, Other (See Comments) and Nausea And Vomiting    +stomach problems   . Advair Diskus [Fluticasone-Salmeterol]     Lung congestion  . Hydrocodone-Acetaminophen Nausea Only  . Penicillins Hives, Rash and Other (See Comments)    Welts Hives, blisters Yes. hosp.    VITALS:  Blood pressure 109/52, pulse 72, temperature 97.7 F (36.5 C), temperature source Oral, resp. rate 17, height 5\' 10"  (1.778 m), weight 99.791 kg (220 lb), SpO2 94 %.  PHYSICAL EXAMINATION:   Physical Exam  Constitutional: He is oriented to person, place, and time and well-developed, well-nourished, and in no distress. No distress.  HENT:  Head: Normocephalic.  Eyes: No scleral icterus.  Neck: Normal range of motion. Neck supple. No JVD present.  No tracheal deviation present.  Cardiovascular: Normal rate, regular rhythm and normal heart sounds.  Exam reveals no gallop and no friction rub.   No murmur heard. Pulmonary/Chest: Effort normal. No respiratory distress. Wheezes: Expiratory wheezing throughout with good air movement. He has no rales. He exhibits no tenderness.  Abdominal: Soft. Bowel sounds are normal. He exhibits no distension and no mass. There is no tenderness. There is no rebound and no guarding.  Musculoskeletal: Normal range of motion. He exhibits no edema.  Neurological: He is alert and oriented to person, place, and time.  Skin: Skin is warm. No rash noted. No erythema.  Psychiatric: Affect and judgment normal.      LABORATORY PANEL:   CBC  Recent Labs Lab 06/24/15 0551  WBC 7.2  HGB 14.5  HCT 44.3  PLT 144*   ------------------------------------------------------------------------------------------------------------------  Chemistries   Recent Labs Lab 06/24/15 0551 06/25/15 0449  NA 136  --   K 4.4  --   CL 98*  --   CO2 32  --   GLUCOSE 262*  --   BUN 35*  --   CREATININE 0.99 0.93  CALCIUM 8.4*  --    ------------------------------------------------------------------------------------------------------------------  Cardiac Enzymes No results for input(s): TROPONINI in the last 168 hours. ------------------------------------------------------------------------------------------------------------------  RADIOLOGY:  Dg Chest 1 View  06/24/2015  CLINICAL DATA:  Shortness of breath.  History of COPD and smoking. EXAM: CHEST 1 VIEW COMPARISON:  06/23/2015 FINDINGS: Stable appearance of emphysematous lung disease without significant hyperinflation. Stable right apical parenchymal scar and pleural thickening. This has been demonstrated by prior CT. No definite focal airspace consolidation, edema, pneumothorax or  pleural fluid identified. The heart size is normal. There is stable mild tortuosity  of the thoracic aorta. No bony abnormalities identified. IMPRESSION: Stable chronic lung disease. No acute infiltrate or pulmonary edema identified. Electronically Signed   By: Aletta Edouard M.D.   On: 06/24/2015 11:05     ASSESSMENT AND PLAN:   59 year old male with past medical history of COPD, chronic pain, depression, neuropathy, history of anxiety, GERD, who presented to the hospital due to shortness of breath and noted to be in acute COPD exacerbation.  1 acute COPD exacerbation: Patient is very slow to respond. Patient continues to be on high-dose steroids, oxygen, nebulizer and inhalerS. Dr Humphrey Rolls has been consulted and I am waiting for further recommendations.. CT chest on admission showed no evidence of pneumonia. Due to hemoptysis and frothy sputum  Chest x-ray yesterday shows no evidence of acute infiltrate or pulmonary edema. Echocardiogram on December 23 showed normal ejection fraction no mention of diastolic dysfunction. I will change IV steroids from 60-40 IV every 8 hours.  2 chronic pain-continue OxyContin, oxycodone, cont. lidocaine patch. Patient does have high drug seeking behavior.  he is followed by pain clinic.  3 neuropathy-continue Neurontin.  4 depression-continue Prozac.  5 tobacco abuse-continue nicotine patch.  Physical therapy is recommending skilled nursing facility at discharge   Management plans discussed with the patient and he is in agreement  CODE STATUS: Full  DVT Prophylaxis: Lovenox  TOTAL TIME TAKING CARE OF THIS PATIENT: 23 minutes.   POSSIBLE D/C IN ??, DEPENDING ON CLINICAL CONDITION.   Hanin Decook M.D on 06/25/2015 at 10:19 AM  Between 7am to 6pm - Pager - [phone]  After 6pm go to [email or web address] - password [email or web address] [facility]  Office  [phone]  CC: Primary care physician; Marden Noble, MD
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
## 2015-06-25 NOTE — Clinical Social Work Note (Signed)
Patient has had a bed offer from [facility] and patient will go there at discharge. Shela Leff MSW,LCSW [phone]

## 2015-06-25 NOTE — Progress Notes (Signed)
Physical Therapy Treatment Patient Details Name: Steven GRANADOS Sr. MRN: [id number] DOB: 1955-10-31 Today's Date: 06/25/2015    History of Present Illness presented to ER secondary to chest pain and SOB; admitted with acute on chronic respiratory failure secondary to COPD exacerbation.  PMH significant for HTN, COPD, neuropathy and chronic back pain.    PT Comments    Pt not tolerating treatment session well, seemingly quite anxious during session, catastrophic current condition, and confrontational to encouragement and uplifting speech. Pt making very minimal progress toward goals as evidenced by increasing ambulation from 3-6 feet, but when pointing this out, pt responds 'I'm not getting better, just doing what I'm told; I don't have much of a choice." Pt's greatest limitation continues to be DOE (5/10) which continues to limit ability to perform all ADL, IADL, and mobility at baseline function. Patient presenting with impairment of strength, pain, O2 perfusion, balance, and activity tolerance, limiting ability to perform ADL and mobility tasks at  baseline level of function. Patient will benefit from skilled intervention to address the above impairments and limitations, in order to restore to prior level of function, improve patient safety upon discharge, and to decrease caregiver burden.    Follow Up Recommendations  SNF     Equipment Recommendations  None recommended by PT    Recommendations for Other Services       Precautions / Restrictions Precautions Precautions: Fall Restrictions Weight Bearing Restrictions: No    Mobility  Bed Mobility Overal bed mobility: Needs Assistance Bed Mobility: Supine to Sit     Supine to sit: Min assist;HOB elevated     General bed mobility comments: Single HHA.   Transfers Overall transfer level: Needs assistance Equipment used: Rolling walker (2 wheeled) Transfers: Sit to/from Stand Sit to Stand: Min guard         General  transfer comment: Performs 6 times for strengthening; very labored, slow, and high effort to perform; O2 sats and HR remain mostly unchanged compared to resting/recumbent.   Ambulation/Gait Ambulation/Gait assistance: Min guard Ambulation Distance (Feet): 6 Feet Assistive device: Rolling walker (2 wheeled)       General Gait Details: very slow, reluctant, and anxious; vitals remain stable, mostly unchanged; Pt asks to DC session as he feels he cannot tolerate any more.    Stairs            Wheelchair Mobility    Modified Rankin (Stroke Patients Only)       Balance Overall balance assessment: No apparent balance deficits (not formally assessed);Modified Independent   Sitting balance-Leahy Scale: Good       Standing balance-Leahy Scale: Fair                      Cognition Arousal/Alertness: Awake/alert Behavior During Therapy: Anxious (catastrophizing pain and current condition. ) Overall Cognitive Status: Within Functional Limits for tasks assessed                      Exercises Other Exercises Other Exercises: STS: 6x at [employer] for LE strengthening.     General Comments        Pertinent Vitals/Pain Pain Assessment: Faces Faces Pain Scale: Hurts even more Pain Location: Reports LBP, HA, chest pain, Neck pain, etc. Says he does not know 'where to begin.' Pain Intervention(s): Limited activity within patient's tolerance;Monitored during session;Premedicated before session    Home Living  Prior Function            PT Goals (current goals can now be found in the care plan section) Acute Rehab PT Goals Patient Stated Goal: "to get to a facility and get stronger before I go home by myself" PT Goal Formulation: With patient Time For Goal Achievement: 07/07/15 Potential to Achieve Goals: Good Progress towards PT goals: PT to reassess next treatment (minimal progress made, will re evalaute next session. )     Frequency  Min 2X/week    PT Plan Current plan remains appropriate    Co-evaluation             End of Session Equipment Utilized During Treatment: Gait belt Activity Tolerance: Patient limited by fatigue;Patient limited by pain;Treatment limited secondary to agitation Patient left: in bed;with bed alarm set;with call bell/phone within reach     Time: 1446-1458 PT Time Calculation (min) (ACUTE ONLY): 12 min  Charges:  $Therapeutic Activity: 8-22 mins                    G Codes:      Georgiann Neider C 06/27/15, 3:11 PM  3:14 PM  Etta Grandchild, PT, DPT [place] License # [id number]

## 2015-06-25 NOTE — Significant Event (Signed)
Walked in patient room as he was walking out of the bathroom.  I immediately told the charge nurse.  Charge nurse is in the room at this time.

## 2015-06-25 NOTE — Care Management (Signed)
CSW following for discharge to SNF.  Will continue to follow for changing discharge plan.

## 2015-06-26 LAB — CBC
HCT: 43.2 % (ref 40.0–52.0)
Hemoglobin: 14.1 g/dL (ref 13.0–18.0)
MCH: 29.6 pg (ref 26.0–34.0)
MCHC: 32.7 g/dL (ref 32.0–36.0)
MCV: 90.5 fL (ref 80.0–100.0)
PLATELETS: 148 10*3/uL — AB (ref 150–440)
RBC: 4.78 MIL/uL (ref 4.40–5.90)
RDW: 15.3 % — ABNORMAL HIGH (ref 11.5–14.5)
WBC: 8.3 10*3/uL (ref 3.8–10.6)

## 2015-06-26 LAB — BASIC METABOLIC PANEL
Anion gap: 5 (ref 5–15)
BUN: 45 mg/dL — AB (ref 6–20)
CHLORIDE: 97 mmol/L — AB (ref 101–111)
CO2: 34 mmol/L — AB (ref 22–32)
Calcium: 8.2 mg/dL — ABNORMAL LOW (ref 8.9–10.3)
Creatinine, Ser: 0.79 mg/dL (ref 0.61–1.24)
GFR calc Af Amer: >60 mL/min (ref >60)
GLUCOSE: 211 mg/dL — AB (ref 65–99)
POTASSIUM: 4.9 mmol/L (ref 3.5–5.1)
Sodium: 136 mmol/L (ref 135–145)

## 2015-06-26 LAB — GLUCOSE, CAPILLARY
GLUCOSE-CAPILLARY: 152 mg/dL — AB (ref 65–99)
GLUCOSE-CAPILLARY: 168 mg/dL — AB (ref 65–99)
GLUCOSE-CAPILLARY: 257 mg/dL — AB (ref 65–99)
Glucose-Capillary: 160 mg/dL — ABNORMAL HIGH (ref 65–99)

## 2015-06-26 MED ORDER — CODEINE SULFATE 30 MG PO TABS
30.0000 mg | ORAL_TABLET | ORAL | Status: DC | PRN
Start: 2015-06-26 — End: 2015-06-30

## 2015-06-26 MED ORDER — HYDROCOD POLST-CPM POLST ER 10-8 MG/5ML PO SUER
5.0000 mL | Freq: Two times a day (BID) | ORAL | Status: DC
  Administered 2015-06-26 – 2015-06-30 (×9): 5 mL via ORAL
  Filled 2015-06-26 (×9): qty 5

## 2015-06-26 MED ORDER — GUAIFENESIN ER 600 MG PO TB12
600.0000 mg | ORAL_TABLET | Freq: Two times a day (BID) | ORAL | Status: DC
  Administered 2015-06-26 – 2015-06-30 (×9): 600 mg via ORAL
  Filled 2015-06-26 (×9): qty 1

## 2015-06-26 MED ORDER — THEOPHYLLINE ER 300 MG PO TB12
300.0000 mg | ORAL_TABLET | Freq: Two times a day (BID) | ORAL | Status: DC
  Administered 2015-06-26 – 2015-06-30 (×9): 300 mg via ORAL
  Filled 2015-06-26 (×10): qty 1
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
## 2015-06-26 NOTE — Progress Notes (Signed)
RN offered to walk with patient around the nursing station to monitor O2 stats. Patient refused and said that "he has barely been able to get out of the bed since he has been here." will continue to encourage pt.   Steven Wiley

## 2015-06-26 NOTE — Progress Notes (Signed)
[place] [facility] [place] NAME: Steven Wiley    MR#:  [id number]  DATE OF BIRTH:  [birth year]  SUBJECTIVE:   Patient reports that he is not feeling well and is weak. Patient continues to have wheezing and cough , complains of achiness all over his body, but because of cough , minimal sputum production with dark sputum .  REVIEW OF SYSTEMS:    Review of Systems  Constitutional: Positive for malaise/fatigue. Negative for fever and chills.  HENT: Negative for congestion and tinnitus.   Eyes: Negative for blurred vision and double vision.  Respiratory: Positive for cough, shortness of breath and wheezing.   Cardiovascular: Negative for chest pain, orthopnea and PND.  Gastrointestinal: Negative for nausea, vomiting, abdominal pain and diarrhea.  Genitourinary: Negative for dysuria and hematuria.  Musculoskeletal: Negative for back pain.  Neurological: Positive for weakness. Negative for dizziness, sensory change and focal weakness.  All other systems reviewed and are negative.   Nutrition: Heart healthy Tolerating Diet: Yes   DRUG ALLERGIES:   Allergies  Allergen Reactions  . Spiriva [Tiotropium Bromide Monohydrate] Other (See Comments)    Lung congestion  . Acetaminophen Hives, Nausea Only, Other (See Comments) and Nausea And Vomiting    +stomach problems   . Advair Diskus [Fluticasone-Salmeterol]     Lung congestion  . Hydrocodone-Acetaminophen Nausea Only  . Penicillins Hives, Rash and Other (See Comments)    Welts Hives, blisters Yes. hosp.    VITALS:  Blood pressure 128/67, pulse 81, temperature 98.2 F (36.8 C), temperature source Oral, resp. rate 18, height 5\' 10"  (1.778 m), weight 99.791 kg (220 lb), SpO2 96 %.  PHYSICAL EXAMINATION:   Physical Exam  Constitutional: He is oriented to person, place, and time and well-developed, well-nourished, and in no distress. No distress.  HENT:  Head: Normocephalic.  Eyes: No  scleral icterus.  Neck: Normal range of motion. Neck supple. No JVD present. No tracheal deviation present.  Cardiovascular: Normal rate, regular rhythm and normal heart sounds.  Exam reveals no gallop and no friction rub.   No murmur heard. Pulmonary/Chest: Effort normal. No respiratory distress. Wheezes: Expiratory wheezing throughout with good air movement. He has no rales. He exhibits no tenderness.  Abdominal: Soft. Bowel sounds are normal. He exhibits no distension and no mass. There is no tenderness. There is no rebound and no guarding.  Musculoskeletal: Normal range of motion. He exhibits no edema.  Neurological: He is alert and oriented to person, place, and time.  Skin: Skin is warm. No rash noted. No erythema.  Psychiatric: Affect and judgment normal.      LABORATORY PANEL:   CBC  Recent Labs Lab 06/26/15 0453  WBC 8.3  HGB 14.1  HCT 43.2  PLT 148*   ------------------------------------------------------------------------------------------------------------------  Chemistries   Recent Labs Lab 06/26/15 0453  NA 136  K 4.9  CL 97*  CO2 34*  GLUCOSE 211*  BUN 45*  CREATININE 0.79  CALCIUM 8.2*   ------------------------------------------------------------------------------------------------------------------  Cardiac Enzymes No results for input(s): TROPONINI in the last 168 hours. ------------------------------------------------------------------------------------------------------------------  RADIOLOGY:  No results found.   ASSESSMENT AND PLAN:   59 year old male with past medical history of COPD, chronic pain, depression, neuropathy, history of anxiety, GERD, who presented to the hospital due to shortness of breath and noted to be in acute COPD exacerbation.  1 acute COPD exacerbation: Patient is very slow to respond. Continue on high-dose steroids, oxygen, nebulizer and inhaler. Appreciate pulmonologist input.Marland Kitchen CT  chest on admission showed no  evidence of pneumonia. Due to hemoptysis and frothy sputum . Initiate patient on Humibid and Tussionex , add codeine for cough , follow clinically  Chest x-ray yesterday shows no evidence of acute infiltrate or pulmonary edema. Echocardiogram on December 23 showed normal ejection fraction no mention of diastolic dysfunction.   2 chronic pain-continue OxyContin, oxycodone, cont. lidocaine patch. Patient does have high drug seeking behavior.  he is followed by pain clinic.   3 neuropathy-continue Neurontin.  4 depression-continue Prozac.  5 tobacco abuse-continue nicotine patch.  6. Physical therapy is recommending skilled nursing facility at discharge ,  social work is involved for further recommendations, continue physical therapy while patient is in the hospital  Management plans discussed with the patient and he is in agreement  CODE STATUS: Full  DVT Prophylaxis: Lovenox  TOTAL TIME TAKING CARE OF THIS PATIENT: 45 minutes.  Prolonged discussion with patient about treatment plan , all questions were answered , he voiced understanding  POSSIBLE D/C IN ??, DEPENDING ON CLINICAL CONDITION.   Theodoro Grist M.D on 06/26/2015 at 2:57 PM  Between 7am to 6pm - Pager - [phone]  After 6pm go to [email or web address] - password [email or web address] [facility]  Office  [phone]  CC: Primary care physician; Marden Noble, MD

## 2015-06-26 NOTE — Progress Notes (Signed)
Inpatient Diabetes Program Recommendations  AACE/ADA: New Consensus Statement on Inpatient Glycemic Control (2015)  Target Ranges:  Prepandial:   less than 140 mg/dL      Peak postprandial:   less than 180 mg/dL (1-2 hours)      Critically ill patients:  140 - 180 mg/dL  Results for JANZEN, KLEBE SR. (MRN [id number]) as of 06/26/2015 07:50  Ref. Range 06/25/2015 07:39 06/25/2015 11:21 06/25/2015 16:29 06/25/2015 20:58 06/26/2015 07:31  Glucose-Capillary Latest Ref Range: 65-99 mg/dL 189 (H) 203 (H) 248 (H) 175 (H) 160 (H)   Review of Glycemic Control Diabetes history: NO Outpatient Diabetes medications: NA Current orders for Inpatient glycemic control: Novolog 0-15 units TID with meals, Novolog 0-5 units HS   Inpatient Diabetes Program Recommendations: Correction (SSI): Please consider increasing Novolog correction to Resistant scale.  Thanks, Barnie Alderman, RN, MSN, CDE Diabetes Coordinator Inpatient Diabetes Program [phone] (Team Pager from [place] to [place]) [phone] (AP office) [phone] [facility] office) [phone] [facility] office)

## 2015-06-26 NOTE — Clinical Social Work Note (Signed)
CSW asked MD when patient may be ready for discharge and was told that patient is still wheezing and coughing and will need to see how he is tomorrow. CSW has left [facility] [place] admission coordinator Chrys Racer: [phone]) a message to see if they can take patient over the weekend.  Shela Leff MSW,LCSW [phone]

## 2015-06-27 LAB — GLUCOSE, CAPILLARY
GLUCOSE-CAPILLARY: 190 mg/dL — AB (ref 65–99)
GLUCOSE-CAPILLARY: 170 mg/dL — AB (ref 65–99)
Glucose-Capillary: 249 mg/dL — ABNORMAL HIGH (ref 65–99)
Glucose-Capillary: 241 mg/dL — ABNORMAL HIGH (ref 65–99)

## 2015-06-27 MED ORDER — NYSTATIN 100000 UNIT/ML MT SUSP
5.0000 mL | Freq: Four times a day (QID) | OROMUCOSAL | Status: DC
  Administered 2015-06-27 – 2015-06-30 (×13): 500000 [IU] via OROMUCOSAL
  Filled 2015-06-27 (×13): qty 5

## 2015-06-27 NOTE — Progress Notes (Signed)
[place] at [place] NAME: Steven Wiley    MR#:  [id number]  DATE OF BIRTH:  1955-10-11  SUBJECTIVE:   Patient feels a little better today, less cough, some sputum production. Patient continues to have wheezing and cough , complains of achiness all over his body, but because of cough , minimal sputum production with dark sputum .  REVIEW OF SYSTEMS:    Review of Systems  Constitutional: Positive for malaise/fatigue. Negative for fever and chills.  HENT: Negative for congestion and tinnitus.   Eyes: Negative for blurred vision and double vision.  Respiratory: Positive for cough, shortness of breath and wheezing.   Cardiovascular: Negative for chest pain, orthopnea and PND.  Gastrointestinal: Negative for nausea, vomiting, abdominal pain and diarrhea.  Genitourinary: Negative for dysuria and hematuria.  Musculoskeletal: Negative for back pain.  Neurological: Positive for weakness. Negative for dizziness, sensory change and focal weakness.  All other systems reviewed and are negative.   Nutrition: Heart healthy Tolerating Diet: Yes   DRUG ALLERGIES:   Allergies  Allergen Reactions  . Spiriva [Tiotropium Bromide Monohydrate] Other (See Comments)    Lung congestion  . Acetaminophen Hives, Nausea Only, Other (See Comments) and Nausea And Vomiting    +stomach problems   . Advair Diskus [Fluticasone-Salmeterol]     Lung congestion  . Hydrocodone-Acetaminophen Nausea Only  . Penicillins Hives, Rash and Other (See Comments)    Welts Hives, blisters Yes. hosp.    VITALS:  Blood pressure 126/58, pulse 82, temperature 98 F (36.7 C), temperature source Oral, resp. rate 20, height 5\' 10"  (1.778 m), weight 99.791 kg (220 lb), SpO2 96 %.  PHYSICAL EXAMINATION:   Physical Exam  Constitutional: He is oriented to person, place, and time and well-developed, well-nourished, and in no distress. No distress.  HENT:  Head: Normocephalic.   Eyes: No scleral icterus.  Neck: Normal range of motion. Neck supple. No JVD present. No tracheal deviation present.  Cardiovascular: Normal rate, regular rhythm and normal heart sounds.  Exam reveals no gallop and no friction rub.   No murmur heard. Pulmonary/Chest: Effort normal. No respiratory distress. Wheezes: Expiratory wheezing throughout with good air movement. He has no rales. He exhibits no tenderness.  Abdominal: Soft. Bowel sounds are normal. He exhibits no distension and no mass. There is no tenderness. There is no rebound and no guarding.  Musculoskeletal: Normal range of motion. He exhibits no edema.  Neurological: He is alert and oriented to person, place, and time.  Skin: Skin is warm. No rash noted. No erythema.  Psychiatric: Affect and judgment normal.      LABORATORY PANEL:   CBC  Recent Labs Lab 06/26/15 0453  WBC 8.3  HGB 14.1  HCT 43.2  PLT 148*   ------------------------------------------------------------------------------------------------------------------  Chemistries   Recent Labs Lab 06/26/15 0453  NA 136  K 4.9  CL 97*  CO2 34*  GLUCOSE 211*  BUN 45*  CREATININE 0.79  CALCIUM 8.2*   ------------------------------------------------------------------------------------------------------------------  Cardiac Enzymes No results for input(s): TROPONINI in the last 168 hours. ------------------------------------------------------------------------------------------------------------------  RADIOLOGY:  No results found.   ASSESSMENT AND PLAN:   59 year old male with past medical history of COPD, chronic pain, depression, neuropathy, history of anxiety, GERD, who presented to the hospital due to shortness of breath and noted to be in acute COPD exacerbation.  1 acute COPD exacerbation: Patient is very slow to respond. Continue on high-dose steroids, oxygen, nebulizer and inhaler. Appreciate pulmonologist input.Marland Kitchen CT  chest on admission  showed no evidence of pneumonia. Due to hemoptysis and frothy sputum . Initiated patient on Humibid and Tussionex , added codeine for cough , improved clinically  Chest x-ray yesterday shows no evidence of acute infiltrate or pulmonary edema. Echocardiogram on December 23 showed normal ejection fraction no mention of diastolic dysfunction. Dr. Raul Del saw patient in consultation and added theophylline as well as flutter valve.    2 chronic pain-continue OxyContin, oxycodone, cont. lidocaine patch. Patient does have high drug seeking behavior.  he is followed by pain clinic.   3 neuropathy-continue Neurontin.  4 depression-continue Prozac.  5 tobacco abuse-continue nicotine patch.  6. Physical therapy is recommending skilled nursing facility at discharge ,  social work is involved for further recommendations, continue physical therapy while patient is in the hospital  Management plans discussed with the patient and he is in agreement  CODE STATUS: Full  DVT Prophylaxis: Lovenox  TOTAL TIME TAKING CARE OF THIS PATIENT: 40 minutes.    POSSIBLE D/C IN ??, DEPENDING ON CLINICAL CONDITION.   Theodoro Grist M.D on 06/27/2015 at 1:41 PM  Between 7am to 6pm - Pager - [phone]  After 6pm go to [email or web address] - password [email or web address] [facility]  Office  [phone]  CC: Primary care physician; Marden Noble, MD

## 2015-06-28 DIAGNOSIS — J9621 Acute and chronic respiratory failure with hypoxia: Secondary | ICD-10-CM | POA: Insufficient documentation

## 2015-06-28 DIAGNOSIS — R079 Chest pain, unspecified: Secondary | ICD-10-CM | POA: Insufficient documentation

## 2015-06-28 DIAGNOSIS — K429 Umbilical hernia without obstruction or gangrene: Secondary | ICD-10-CM

## 2015-06-28 DIAGNOSIS — K439 Ventral hernia without obstruction or gangrene: Secondary | ICD-10-CM | POA: Insufficient documentation

## 2015-06-28 DIAGNOSIS — M549 Dorsalgia, unspecified: Secondary | ICD-10-CM

## 2015-06-28 DIAGNOSIS — G8929 Other chronic pain: Secondary | ICD-10-CM | POA: Insufficient documentation

## 2015-06-28 LAB — GLUCOSE, CAPILLARY
GLUCOSE-CAPILLARY: 184 mg/dL — AB (ref 65–99)
GLUCOSE-CAPILLARY: 180 mg/dL — AB (ref 65–99)
GLUCOSE-CAPILLARY: 200 mg/dL — AB (ref 65–99)
Glucose-Capillary: 156 mg/dL — ABNORMAL HIGH (ref 65–99)

## 2015-06-28 MED ORDER — IOHEXOL 240 MG/ML SOLN: 25.0000 mL | INTRAMUSCULAR | Status: AC

## 2015-06-28 MED ORDER — MENTHOL 3 MG MT LOZG
1.0000 | LOZENGE | OROMUCOSAL | Status: DC | PRN
  Filled 2015-06-28: qty 9

## 2015-06-28 NOTE — Progress Notes (Signed)
[place] at [place] NAME: Steven Wiley    MR#:  [id number]  DATE OF BIRTH:  [birth year]  SUBJECTIVE:   Patient feels a some better today, still lots of dry cough, minimal sputum production. Patient continues to have wheezing and cough , complains of achiness all over his body, but because of cough , minimal sputum production with dark sputum . Also abdominal pain whenever he coughs due to umbilical hernia REVIEW OF SYSTEMS:    Review of Systems  Constitutional: Positive for malaise/fatigue. Negative for fever and chills.  HENT: Negative for congestion and tinnitus.   Eyes: Negative for blurred vision and double vision.  Respiratory: Positive for cough, shortness of breath and wheezing.   Cardiovascular: Negative for chest pain, orthopnea and PND.  Gastrointestinal: Negative for nausea, vomiting, abdominal pain and diarrhea.  Genitourinary: Negative for dysuria and hematuria.  Musculoskeletal: Negative for back pain.  Neurological: Positive for weakness. Negative for dizziness, sensory change and focal weakness.  All other systems reviewed and are negative.   Nutrition: Heart healthy Tolerating Diet: Yes   DRUG ALLERGIES:   Allergies  Allergen Reactions  . Spiriva [Tiotropium Bromide Monohydrate] Other (See Comments)    Lung congestion  . Acetaminophen Hives, Nausea Only, Other (See Comments) and Nausea And Vomiting    +stomach problems   . Advair Diskus [Fluticasone-Salmeterol]     Lung congestion  . Hydrocodone-Acetaminophen Nausea Only  . Penicillins Hives, Rash and Other (See Comments)    Welts Hives, blisters Yes. hosp.    VITALS:  Blood pressure 130/74, pulse 80, temperature 98.4 F (36.9 C), temperature source Oral, resp. rate 18, height 5\' 10"  (1.778 m), weight 99.791 kg (220 lb), SpO2 97 %.  PHYSICAL EXAMINATION:   Physical Exam  Constitutional: He is oriented to person, place, and time and well-developed,  well-nourished, and in no distress. No distress.  HENT:  Head: Normocephalic.  Eyes: No scleral icterus.  Neck: Normal range of motion. Neck supple. No JVD present. No tracheal deviation present.  Cardiovascular: Normal rate, regular rhythm and normal heart sounds.  Exam reveals no gallop and no friction rub.   No murmur heard. Pulmonary/Chest: Effort normal. No respiratory distress. Wheezes: Expiratory wheezing throughout with good air movement. He has no rales. He exhibits no tenderness.  Abdominal: Soft. Bowel sounds are normal. He exhibits no distension and no mass. There is no tenderness. There is no rebound and no guarding.  Musculoskeletal: Normal range of motion. He exhibits no edema.  Neurological: He is alert and oriented to person, place, and time.  Skin: Skin is warm. No rash noted. No erythema.  Psychiatric: Affect and judgment normal.   tender abdominal area centrally on palpation with a reducible umbilical hernia    LABORATORY PANEL:   CBC  Recent Labs Lab 06/26/15 0453  WBC 8.3  HGB 14.1  HCT 43.2  PLT 148*   ------------------------------------------------------------------------------------------------------------------  Chemistries   Recent Labs Lab 06/26/15 0453  NA 136  K 4.9  CL 97*  CO2 34*  GLUCOSE 211*  BUN 45*  CREATININE 0.79  CALCIUM 8.2*   ------------------------------------------------------------------------------------------------------------------  Cardiac Enzymes No results for input(s): TROPONINI in the last 168 hours. ------------------------------------------------------------------------------------------------------------------  RADIOLOGY:  No results found.   ASSESSMENT AND PLAN:   60 year old male with past medical history of COPD, chronic pain, depression, neuropathy, history of anxiety, GERD, who presented to the hospital due to shortness of breath and noted to be in acute COPD exacerbation.  1 acute COPD  exacerbation: Patient is very slow to respond. Continue on high-dose steroids, oxygen, nebulizer and inhaler, now on theophylline orally. Appreciate pulmonologist input.. CT chest on admission showed no evidence of pneumonia. Due to hemoptysis and frothy sputum . Initiated patient on Humibid and Tussionex , added codeine for cough , minimal improvement clinically  Chest x-ray shows no evidence of acute infiltrate or pulmonary edema. Echocardiogram on December 23 showed normal ejection fraction no mention of diastolic dysfunction. Dr. Raul Del saw patient in consultation and added theophylline as well as flutter valve.    2 chronic pain-continue OxyContin, oxycodone, cont. lidocaine patch. Patient does have high drug seeking behavior.  he is followed by pain clinic.   3 neuropathy-continue Neurontin.  4 depression-continue Prozac.  5 tobacco abuse-continue nicotine patch.  6. Physical therapy is recommending skilled nursing facility at discharge ,  social work is involved for further recommendations, continue physical therapy while patient is in the hospital 7. Umbilical hernia, reducible, per surgery, no need for CT scan of abdomen, supportive therapy   Management plans discussed with the patient and he is in agreement  CODE STATUS: Full  DVT Prophylaxis: Lovenox  TOTAL TIME TAKING CARE OF THIS PATIENT: 40 minutes.   Discussed with Dr. Azalee Course POSSIBLE D/C IN ??, DEPENDING ON CLINICAL CONDITION.   Theodoro Grist M.D on 06/28/2015 at 1:05 PM  Between 7am to 6pm - Pager - [phone]  After 6pm go to [email or web address] - password [email or web address] [place] [facility]  Office  [phone]  CC: Primary care physician; Marden Noble, MD
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
## 2015-06-28 NOTE — Progress Notes (Signed)
Patient is scheduled to have a CT of his abdomen today. Per radiology, start contrast solution at 11, then give the second bottle at 12 since patient ate breakfast this morning. Will notify when patient has completed contrast. Abdominal binder received from materials to use per MD order. Went to put on patient and Dr. Azalee Course from surgery stated not to put it on, that it would not help. MD just pushed the hernia back in and the patient states it already felt better. Coralee Pesa was already opened and patient states he wants to keep it for home, so will leave with the patient.

## 2015-06-28 NOTE — Progress Notes (Addendum)
Dr. Azalee Course stated patient does not need CT of abdomen, order cancelled and CT notified. CT tech will come back and get contrast. Patient did not drink contrast.

## 2015-06-29 DIAGNOSIS — F4323 Adjustment disorder with mixed anxiety and depressed mood: Secondary | ICD-10-CM

## 2015-06-29 LAB — GLUCOSE, CAPILLARY
GLUCOSE-CAPILLARY: 191 mg/dL — AB (ref 65–99)
Glucose-Capillary: 203 mg/dL — ABNORMAL HIGH (ref 65–99)
Glucose-Capillary: 167 mg/dL — ABNORMAL HIGH (ref 65–99)
Glucose-Capillary: 195 mg/dL — ABNORMAL HIGH (ref 65–99)

## 2015-06-29 LAB — EXPECTORATED SPUTUM ASSESSMENT W GRAM STAIN, RFLX TO RESP C: Special Requests: NORMAL

## 2015-06-29 LAB — EXPECTORATED SPUTUM ASSESSMENT W REFEX TO RESP CULTURE

## 2015-06-29 MED ORDER — LEVOFLOXACIN 500 MG PO TABS
750.0000 mg | ORAL_TABLET | Freq: Every day | ORAL | Status: DC
  Administered 2015-06-29 – 2015-06-30 (×2): 750 mg via ORAL
  Filled 2015-06-29 (×2): qty 1

## 2015-06-29 MED ORDER — METHYLPREDNISOLONE SODIUM SUCC 125 MG IJ SOLR
60.0000 mg | Freq: Two times a day (BID) | INTRAMUSCULAR | Status: DC
  Administered 2015-06-30: 60 mg via INTRAVENOUS
  Filled 2015-06-29: qty 2

## 2015-06-29 MED ORDER — LEVOFLOXACIN IN D5W 750 MG/150ML IV SOLN
750.0000 mg | INTRAVENOUS | Status: DC
  Filled 2015-06-29: qty 150

## 2015-06-29 NOTE — Progress Notes (Signed)
[facility] NAME: Steven Wiley    MR#:  [id number]  DATE OF BIRTH:  07/28/1955  SUBJECTIVE:   Patient feels a some better today, still lots of dry cough, minimal sputum production. Patient continues to have wheezing and cough , complains of achiness all over his body, but because of cough , thick, gray colored sputum. Today. Initiated on levofloxacin, refuses intravenous infusion.  Still abdominal pain whenever he coughs due to umbilical hernia. Planes of the right in the index finger pain. Concerned about possible pus under the skin REVIEW OF SYSTEMS:    Review of Systems  Constitutional: Positive for malaise/fatigue. Negative for fever and chills.  HENT: Negative for congestion and tinnitus.   Eyes: Negative for blurred vision and double vision.  Respiratory: Positive for cough, shortness of breath and wheezing.   Cardiovascular: Negative for chest pain, orthopnea and PND.  Gastrointestinal: Negative for nausea, vomiting, abdominal pain and diarrhea.  Genitourinary: Negative for dysuria and hematuria.  Musculoskeletal: Negative for back pain.  Neurological: Positive for weakness. Negative for dizziness, sensory change and focal weakness.  All other systems reviewed and are negative.   Nutrition: Heart healthy Tolerating Diet: Yes   DRUG ALLERGIES:   Allergies  Allergen Reactions  . Spiriva [Tiotropium Bromide Monohydrate] Other (See Comments)    Lung congestion  . Acetaminophen Hives, Nausea Only, Other (See Comments) and Nausea And Vomiting    +stomach problems   . Advair Diskus [Fluticasone-Salmeterol]     Lung congestion  . Hydrocodone-Acetaminophen Nausea Only  . Penicillins Hives, Rash and Other (See Comments)    Welts Hives, blisters Yes. hosp.    VITALS:  Blood pressure 142/78, pulse 79, temperature 98.1 F (36.7 C), temperature source Oral, resp. rate 17, height 5\' 10"  (1.778 m), weight 99.791 kg (220  lb), SpO2 94 %.  PHYSICAL EXAMINATION:   Physical Exam  Constitutional: He is oriented to person, place, and time and well-developed, well-nourished, and in no distress. No distress.  HENT:  Head: Normocephalic.  Eyes: No scleral icterus.  Neck: Normal range of motion. Neck supple. No JVD present. No tracheal deviation present.  Cardiovascular: Normal rate, regular rhythm and normal heart sounds.  Exam reveals no gallop and no friction rub.   No murmur heard. Pulmonary/Chest: Effort normal. No respiratory distress. Wheezes: Expiratory wheezing throughout with good air movement. He has no rales. He exhibits no tenderness.  Abdominal: Soft. Bowel sounds are normal. He exhibits no distension and no mass. There is no tenderness. There is no rebound and no guarding.  Musculoskeletal: Normal range of motion. He exhibits no edema.  Neurological: He is alert and oriented to person, place, and time.  Skin: Skin is warm. No rash noted. No erythema.  Psychiatric: Affect and judgment normal.   tender abdominal area centrally on palpation with a reducible umbilical hernia  Right index finger has significant swelling and pain on palpation but no pustule was noted or increased warmth. No fluctuation, although under the skin and discoloration was noted, likely subcutaneous hematoma  LABORATORY PANEL:   CBC  Recent Labs Lab 06/26/15 0453  WBC 8.3  HGB 14.1  HCT 43.2  PLT 148*   ------------------------------------------------------------------------------------------------------------------  Chemistries   Recent Labs Lab 06/26/15 0453  NA 136  K 4.9  CL 97*  CO2 34*  GLUCOSE 211*  BUN 45*  CREATININE 0.79  CALCIUM 8.2*   ------------------------------------------------------------------------------------------------------------------  Cardiac Enzymes No results for input(s): TROPONINI in the  last 168  hours. ------------------------------------------------------------------------------------------------------------------  RADIOLOGY:  No results found.   ASSESSMENT AND PLAN:   60 year old male with past medical history of COPD, chronic pain, depression, neuropathy, history of anxiety, GERD, who presented to the hospital due to shortness of breath and noted to be in acute COPD exacerbation.  1 acute COPD exacerbation: Patient is very slow to respond. Continue on high-dose steroids, oxygen, nebulizer and inhaler, now on theophylline orally. Appreciate pulmonologist input.. CT chest on admission showed no evidence of pneumonia. Due to hemoptysis and frothy sputum . Initiated patient on Humibid and Tussionex , added codeine for cough , minimal improvement clinically . Attempted to get patient to breathe through the straw to rule out vocal cord dysfunction, patient continues to have significant lower airway wheezing, inspiratory as well as expiratory.  Chest x-ray shows no evidence of acute infiltrate or pulmonary edema. Echocardiogram on December 23 showed normal ejection fraction no mention of diastolic dysfunction. Dr. Raul Del saw patient in consultation and added theophylline as well as flutter valve. Currently, patient is pocketing his medications in the cheek and then hives them in the drawer a.m., per nursing staff. We will be getting psychiatrist involved. Further recommendations, as I'm concerned the patient is compromising his own treatment.    2 chronic pain-continue OxyContin, oxycodone, cont. lidocaine patch. Patient does have high drug seeking behavior.  he is followed by pain clinic.   3 neuropathy-continue Neurontin.  4 depression-continue Prozac. Psychiatry consultation is requested as patient continues to behave oddly and pockets his medications, compromising his own therapy  5 tobacco abuse-continue nicotine patch.  6. Physical therapy is recommending skilled nursing  facility at discharge ,  social work is involved for further recommendations, continue physical therapy while patient is in the hospital  7. Umbilical hernia, reducible, per surgery, no need for CT scan of abdomen, supportive therapy, appreciate surgical evaluation  8. Right index finger swelling could be related to subcutaneous hematoma due to Accu-Cheks, rule out infection, following closely . Surgical evaluation is appreciated. ,  Management plans discussed with the patient and he is in agreement  CODE STATUS: Full  DVT Prophylaxis: Lovenox  TOTAL TIME TAKING CARE OF THIS PATIENT: 40 minutes.    POSSIBLE D/C IN ??, DEPENDING ON CLINICAL CONDITION.   Theodoro Grist M.D on 06/29/2015 at 3:13 PM  Between 7am to 6pm - Pager - [phone]  After 6pm go to [email or web address] - password [email or web address] [facility]  Office  [phone]  CC: Primary care physician; Marden Noble, MD
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
## 2015-06-29 NOTE — Progress Notes (Addendum)
CC: COPD Subjective: 60 year old male admitted to the medicine department for a COPD exacerbation. Surgery consult was for umbilical hernia. Patient denies any changes to the area this morning. No current complaints to the area this morning.  Objective: Vital signs in last 24 hours: Temp:  [97.8 F (36.6 C)-98.4 F (36.9 C)] 97.8 F (36.6 C) (01/01 2146) Pulse Rate:  [73-80] 73 (01/01 2146) Resp:  [18-19] 19 (01/01 2146) BP: (130-159)/(74-81) 159/81 mmHg (01/01 2146) SpO2:  [94 %-97 %] 95 % (01/02 0814) Last BM Date: 06/27/15  Intake/Output from previous day: 01/01 0701 - 01/02 0700 In: 840 [P.O.:840] Out: 2150 [Urine:2150] Intake/Output this shift: Total I/O In: -  Out: 600 [Urine:600]  Physical exam:  Gen.: No acute distress Chest: Coarse breath sounds throughout Heart: Regular rate and rhythm Abdomen: Soft, nondistended, mildly tender palpation on palpation around the large umbilical hernia that is easily reducible.  Lab Results: CBC  No results for input(s): WBC, HGB, HCT, PLT in the last 72 hours. BMET No results for input(s): NA, K, CL, CO2, GLUCOSE, BUN, CREATININE, CALCIUM in the last 72 hours. PT/INR No results for input(s): LABPROT, INR in the last 72 hours. ABG  Recent Labs  06/27/15 1540  PHART 7.40  HCO3 31.6*    Studies/Results: No results found.  Anti-infectives: Anti-infectives    Start     Dose/Rate Route Frequency Ordered Stop   06/22/15 1800  azithromycin (ZITHROMAX) tablet 500 mg     500 mg Oral Daily 06/22/15 1445 06/25/15 1708   06/19/15 1800  azithromycin (ZITHROMAX) tablet 500 mg  Status:  Discontinued     500 mg Oral Daily 06/19/15 1215 06/22/15 1445   06/18/15 2200  azithromycin (ZITHROMAX) 500 mg in dextrose 5 % 250 mL IVPB  Status:  Discontinued     500 mg 250 mL/hr over 60 Minutes Intravenous Every 24 hours 06/18/15 2155 06/19/15 1215   06/18/15 1800  levofloxacin (LEVAQUIN) tablet 750 mg  Status:  Discontinued     750 mg Oral  Daily after supper 06/18/15 1720 06/18/15 1826   06/18/15 1730  levofloxacin (LEVAQUIN) IVPB 750 mg  Status:  Discontinued     750 mg 100 mL/hr over 90 Minutes Intravenous Every 24 hours 06/18/15 1720 06/18/15 2155      Assessment/Plan:  60 year old male admitted to medicine department for a COPD exacerbation. Has a large umbilical hernia that is easily reducible. Discussed with the patient that we would evaluate him as an outpatient as long as it remains reducible. Should the area become stuck out and not able to push 10 by patient or medical provider he might then require urgent operative intervention. Otherwise we will see him in clinic 1-2 weeks after discharge from this hospital stay. Please call surgery again if we can be of further assistance.  Alhaji Mcneal T. Adonis Huguenin, MD, FACS  06/29/2015      Called back for evaluation of right index finger. There is evidence of trauma from finger sticks to that finger. Likely represents a blood blister on exam. No indication for any surgical intervention. Recommend bacitracin, dry dressing, and to no longer obtain finger sticks from that finger. Again, please call if surgery can be of any further assistance.

## 2015-06-29 NOTE — Progress Notes (Signed)
Physical Therapy Treatment Patient Details Name: Steven YAMASHIRO Sr. MRN: [id number] DOB: 19-Sep-1955 Today's Date: 06/29/2015    History of Present Illness presented to ER secondary to chest pain and SOB; admitted with acute on chronic respiratory failure secondary to COPD exacerbation.  PMH significant for HTN, COPD, neuropathy and chronic back pain.    PT Comments    Pt presents with significant pain in abdomen/chest that increases with bed mobility, transfers and ambulation. Pt currently on room air with initial O2 saturation at 90%; educated on pursed lip breathing as well as breathing with abdominal isolation exercises. O2 saturation increases and maintain throughout session to 92-94%. Pt educated on abdominal bracing in supine, seated and standing positions as well as with functional activities. Encouraged manual medial "squeeze of abdominal musculature and approximation of linea alba with cough/sneeze. Pt requires Mod A for bed mobility and Min A for transfer. Pt able to walk 13 feet x 2 with extended seated rest period. Ambulation antalgic and effortful. Pt noted to be tearful/upset during session and discusses history of illness/injury and personal issues pt facing. Encouraged discussion as pt feels comfortable with social work and Clinical biochemist. Continue PT for progression of strength, endurance and all functional mobility.   Follow Up Recommendations  SNF     Equipment Recommendations  None recommended by PT    Recommendations for Other Services       Precautions / Restrictions Restrictions Weight Bearing Restrictions: No    Mobility  Bed Mobility Overal bed mobility: Needs Assistance Bed Mobility: Supine to Sit;Sit to Supine     Supine to sit: Mod assist;HOB elevated Sit to supine: Mod assist;HOB elevated   General bed mobility comments: Effortful, cues to brace abdomen and exhale with exertion. Edge of bed sitting, O2 saturation 92-94%  Transfers Overall transfer level:  Needs assistance Equipment used: Rolling walker (2 wheeled) Transfers: Sit to/from Stand Sit to Stand: Min assist         General transfer comment: cues for hand placement, abdominal brace. Needs time to attain posture and calm pain/exertion with static stand  Ambulation/Gait Ambulation/Gait assistance: Min guard Ambulation Distance (Feet): 13 Feet (performed 2x with extended seated rest period required ) Assistive device: Rolling walker (2 wheeled) Gait Pattern/deviations: Step-to pattern;Antalgic;Trunk flexed Gait velocity: extremely slow Gait velocity interpretation: <1.8 ft/sec, indicative of risk for recurrent falls General Gait Details: extremely slow, effortful movement with trunk flexed and heavy use of BUEs. Sounds SOB, O2 saturation maintains 92-94%   Stairs            Wheelchair Mobility    Modified Rankin (Stroke Patients Only)       Balance     Sitting balance-Leahy Scale: Good       Standing balance-Leahy Scale: Poor (heavy lean on rw)                      Cognition Arousal/Alertness: Awake/alert Behavior During Therapy: WFL for tasks assessed/performed (tearful, upset) Overall Cognitive Status: Within Functional Limits for tasks assessed                      Exercises Other Exercises Other Exercises: Abdominal bracing in supine, seate and stand in sets of 5 with focus on breathing as well    General Comments        Pertinent Vitals/Pain Pain Assessment: 0-10 Pain Score: 6  (Increases to 8 with stand) Pain Location: Abdomen and chest Pain Descriptors / Indicators: Constant;Moaning;Radiating;Other (Comment) (ripping  type pain in abdomen) Pain Intervention(s): Limited activity within patient's tolerance;Monitored during session;Premedicated before session    Home Living                      Prior Function            PT Goals (current goals can now be found in the care plan section) Progress towards PT goals:  Progressing toward goals (very slowly)    Frequency  Min 2X/week    PT Plan Current plan remains appropriate    Co-evaluation             End of Session Equipment Utilized During Treatment: Gait belt Activity Tolerance: Patient limited by fatigue;Patient limited by pain (limited by weakness) Patient left: in bed;with call bell/phone within reach;with nursing/sitter in room     Time: [id number] PT Time Calculation (min) (ACUTE ONLY): 35 min  Charges:  $Gait Training: 8-22 mins $Therapeutic Exercise: 8-22 mins                    G Codes:      Charlaine Dalton 06/29/2015, 5:30 PM

## 2015-06-30 LAB — BASIC METABOLIC PANEL
ANION GAP: 4 — AB (ref 5–15)
BUN: 36 mg/dL — ABNORMAL HIGH (ref 6–20)
CO2: 33 mmol/L — AB (ref 22–32)
Calcium: 8.1 mg/dL — ABNORMAL LOW (ref 8.9–10.3)
Chloride: 97 mmol/L — ABNORMAL LOW (ref 101–111)
Creatinine, Ser: 0.93 mg/dL (ref 0.61–1.24)
GLUCOSE: 199 mg/dL — AB (ref 65–99)
POTASSIUM: 4.8 mmol/L (ref 3.5–5.1)
Sodium: 134 mmol/L — ABNORMAL LOW (ref 135–145)

## 2015-06-30 LAB — BLOOD GAS, ARTERIAL
ACID-BASE EXCESS: 5.4 mmol/L — AB (ref 0.0–3.0)
ALLENS TEST (PASS/FAIL): POSITIVE — AB
BICARBONATE: 31.6 meq/L — AB (ref 21.0–28.0)
FIO2: 0.28
O2 SAT: 94.7 %
PATIENT TEMPERATURE: 37
PO2 ART: 74 mmHg — AB (ref 83.0–108.0)
pCO2 arterial: 51 mmHg — ABNORMAL HIGH (ref 32.0–48.0)
pH, Arterial: 7.4 (ref 7.350–7.450)

## 2015-06-30 LAB — GLUCOSE, CAPILLARY: Glucose-Capillary: 152 mg/dL — ABNORMAL HIGH (ref 65–99)

## 2015-06-30 MED ORDER — OXYCODONE HCL ER 20 MG PO T12A: 20.0000 mg | EXTENDED_RELEASE_TABLET | Freq: Two times a day (BID) | ORAL | Status: DC

## 2015-06-30 MED ORDER — BUDESONIDE 0.5 MG/2ML IN SUSP: 0.5000 mg | Freq: Two times a day (BID) | RESPIRATORY_TRACT | Status: DC

## 2015-06-30 MED ORDER — GUAIFENESIN ER 600 MG PO TB12: 600.0000 mg | ORAL_TABLET | Freq: Two times a day (BID) | ORAL | Status: DC

## 2015-06-30 MED ORDER — HYDROCOD POLST-CPM POLST ER 10-8 MG/5ML PO SUER: 5.0000 mL | Freq: Two times a day (BID) | ORAL | Status: DC

## 2015-06-30 MED ORDER — RAMELTEON 8 MG PO TABS: 8.0000 mg | ORAL_TABLET | Freq: Every day | ORAL | Status: DC

## 2015-06-30 MED ORDER — FLUTICASONE FUROATE-VILANTEROL 100-25 MCG/INH IN AEPB: 1.0000 | INHALATION_SPRAY | Freq: Every day | RESPIRATORY_TRACT | Status: DC

## 2015-06-30 MED ORDER — PREDNISONE 10 MG (21) PO TBPK: 10.0000 mg | ORAL_TABLET | Freq: Every day | ORAL | Status: DC

## 2015-06-30 MED ORDER — DOXYCYCLINE HYCLATE 100 MG PO CAPS: 100.0000 mg | ORAL_CAPSULE | Freq: Two times a day (BID) | ORAL | Status: DC

## 2015-06-30 MED ORDER — OXYCODONE HCL 10 MG PO TABS: 10.0000 mg | ORAL_TABLET | ORAL | Status: DC | PRN

## 2015-06-30 MED ORDER — LEVOFLOXACIN 500 MG PO TABS: 500.0000 mg | ORAL_TABLET | Freq: Every day | ORAL | Status: DC

## 2015-06-30 MED ORDER — CODEINE SULFATE 30 MG PO TABS: 30.0000 mg | ORAL_TABLET | ORAL | Status: DC | PRN

## 2015-06-30 MED ORDER — THEOPHYLLINE ER 300 MG PO TB12: 300.0000 mg | ORAL_TABLET | Freq: Every day | ORAL | Status: DC

## 2015-06-30 MED ORDER — ALBUTEROL SULFATE (2.5 MG/3ML) 0.083% IN NEBU: 2.5000 mg | INHALATION_SOLUTION | RESPIRATORY_TRACT | Status: DC | PRN
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
## 2015-06-30 NOTE — Progress Notes (Signed)
06/30/2015 3:04 PM  BP 146/73 mmHg  Pulse 80  Temp(Src) 98.3 F (36.8 C) (Oral)  Resp 16  Ht 5\' 10"  (1.778 m)  Wt 99.791 kg (220 lb)  BMI 31.57 kg/m2  SpO2 92% No acute changes. Patient discharged per MD orders. Discharge instructions reviewed with patient per MD request. IV removed per policy. Prescriptions discussed with patient. Discharged via stretcher escorted by EMS.   Almedia Balls, RN

## 2015-06-30 NOTE — Clinical Social Work Note (Signed)
Patient to discharge today to [facility] [place]. Discharge information sent to [facility] via epic at [facility]. Chrys Racer is aware of patient coming to them today. Patient is aware and will transport via EMS today. Shela Leff MSW,LCSW [phone]

## 2015-06-30 NOTE — Care Management (Signed)
Case discussed with Dr Michail Sermon and also Dr Doy Hutching, Patient does not meet for continued stay per [place] review. Patient has been accepted at [facility] per [place]. Patient has been weaned from O2 and is on room air. Anticipate discharge soon.

## 2015-06-30 NOTE — Clinical Social Work Note (Signed)
MD called after patient was discharged from hospital and stated that she was going to need to change patient's antibiotic. MD provided CSW with new script and made adjustments in the discharge summary. CSW called Chrys Racer at [facility] and informed her that a change had been made and [place] requested script and updated d/c summary be sent to fax: [phone]. CSW has sent the fax to that number. Shela Leff MSW,LCSW [phone]

## 2015-06-30 NOTE — Clinical Social Work Placement (Signed)
   CLINICAL SOCIAL WORK PLACEMENT  NOTE  Date:  06/30/2015  Patient Details  Name: Steven GRAYS Sr. MRN: [id number] Date of Birth: [birth year]  Clinical Social Work is seeking post-discharge placement for this patient at the [place] level of care (*CSW will initial, date and re-position this form in  chart as items are completed):  Yes   Patient/family provided with [facility] Work Department's list of facilities offering this level of care within the geographic area requested by the patient (or if unable, by the patient's family).  Yes   Patient/family informed of their freedom to choose among providers that offer the needed level of care, that participate in Medicare, Medicaid or managed care program needed by the patient, have an available bed and are willing to accept the patient.  Yes   Patient/family informed of [facility]'s ownership interest in [facility] and [facility], as well as of the fact that they are under no obligation to receive care at these facilities.  PASRR submitted to EDS on       PASRR number received on       Existing PASRR number confirmed on 06/24/15     FL2 transmitted to all facilities in geographic area requested by pt/family on 06/24/15     FL2 transmitted to all facilities within larger geographic area on       Patient informed that his/her managed care company has contracts with or will negotiate with certain facilities, including the following:        Yes   Patient/family informed of bed offers received.  Patient chooses bed at  [facility] [place])     Physician recommends and patient chooses bed at  [facility])    Patient to be transferred to  [facility] [place]) on 06/30/15.  Patient to be transferred to facility by  (EMS)     Patient family notified on 06/30/15 of transfer.  Name of family member notified:        PHYSICIAN       Additional Comment:     _______________________________________________ Shela Leff, LCSW 06/30/2015, 10:55 AM

## 2015-06-30 NOTE — Progress Notes (Signed)
Nutrition Follow-up     INTERVENTION:  Meals and snacks: cater to pt preferences   NUTRITION DIAGNOSIS:     related to   as evidenced by  .  None at this time  GOAL:   Patient will meet greater than or equal to 90% of their needs    MONITOR:    (Energy Intake, Electrolyte and renal Profile, Pulmonary Profile)  REASON FOR ASSESSMENT:   LOS    ASSESSMENT:     Pt awaiting placement   Current Nutrition: eating well, noted 100% of most meals consumed   Gastrointestinal Profile: Last BM: 1/02   Scheduled Medications:  . aspirin EC  325 mg Oral Daily  . budesonide (PULMICORT) nebulizer solution  0.5 mg Nebulization BID  . chlorpheniramine-HYDROcodone  5 mL Oral Q12H  . enoxaparin (LOVENOX) injection  40 mg Subcutaneous Q24H  . FLUoxetine  40 mg Oral Daily  . gabapentin  600 mg Oral QID  . guaiFENesin  600 mg Oral BID  . insulin aspart  0-15 Units Subcutaneous TID WC  . insulin aspart  0-5 Units Subcutaneous QHS  . ipratropium-albuterol  3 mL Nebulization Q4H  . levofloxacin  750 mg Oral Daily  . lidocaine  1 patch Transdermal Q24H  . loratadine  10 mg Oral Daily  . methylPREDNISolone (SOLU-MEDROL) injection  60 mg Intravenous Q12H  . multivitamin with minerals  1 tablet Oral Daily  . neomycin-polymyxin b-dexamethasone  1 drop Both Eyes QID  . nicotine  14 mg Transdermal Daily  . nystatin  5 mL Mouth/Throat QID  . oxyCODONE  20 mg Oral Q12H  . prazosin  1 mg Oral QHS  . ramelteon  8 mg Oral QHS  . theophylline  300 mg Oral Q12H  . traZODone  200 mg Oral QHS       Electrolyte/Renal Profile and Glucose Profile:   Recent Labs Lab 06/24/15 0551 06/25/15 0449 06/26/15 0453 06/30/15 0508  NA 136  --  136 134*  K 4.4  --  4.9 4.8  CL 98*  --  97* 97*  CO2 32  --  34* 33*  BUN 35*  --  45* 36*  CREATININE 0.99 0.93 0.79 0.93  CALCIUM 8.4*  --  8.2* 8.1*  GLUCOSE 262*  --  211* 199*        Weight Trend since Admission: Filed Weights   06/18/15 0926  Weight: 220 lb (99.791 kg)      Diet Order:  Diet Heart Room service appropriate?: Yes; Fluid consistency:: Thin Diet - low sodium heart healthy  Skin:   reviewed   Height:   Ht Readings from Last 1 Encounters:  06/18/15 5\' 10"  (1.778 m)    Weight:   Wt Readings from Last 1 Encounters:  06/18/15 220 lb (99.791 kg)    Ideal Body Weight:     BMI:  Body mass index is 31.57 kg/(m^2).   EDUCATION NEEDS:   No education needs identified at this time  LOW Care Level  Lawan Nanez B. Zenia Resides, [place], [place] (pager) Weekend/On-Call pager [phone])

## 2015-06-30 NOTE — Progress Notes (Signed)
Patient being discharged per MD orders. Nurse tech in room helping patient wash up and change into discharge attire. Report called to Hassan Rowan, RN at facility being discharged to. Patient requesting nausea medication. While trying to administer PRN nausea medication and review requested discharge prescriptions, patient noted to be complaining loudly that nursing staff is "throwing him out too fast" and "not listening to him". While I attempted to have therapeutic communication with patient with no distractions, patient got upset that I was not multi-tasking and giving him his nausea medication while trying to listen to him. Following that incident, the patient stated that I "didn't know what I was doing and was being very rude". He also stated that "I needed to learn how to be nice". After all possible attempts to please the patient by listening and being respectful and professional, Dr. Tressia Miners notified and spoke with patient. Dr. Tressia Miners then notified me requesting that I give a discharge summary to the patient at discharge, although they are not to be given to patient discharging to facilities. Patient awaiting to be discharged.  Almedia Balls, RN

## 2015-07-02 LAB — CULTURE, RESPIRATORY W GRAM STAIN: Special Requests: NORMAL

## 2015-07-02 LAB — CULTURE, RESPIRATORY

## 2015-07-13 ENCOUNTER — Emergency Department: Payer: Medicaid Other

## 2015-07-13 ENCOUNTER — Encounter: Payer: Self-pay | Admitting: Emergency Medicine

## 2015-07-13 ENCOUNTER — Inpatient Hospital Stay
Admission: EM | Admit: 2015-07-13 | Discharge: 2015-07-21 | DRG: 190 | Disposition: A | Discharge status: Skilled Nursing Facility | Payer: Medicaid Other | Attending: Internal Medicine | Admitting: Internal Medicine

## 2015-07-13 DIAGNOSIS — J45909 Unspecified asthma, uncomplicated: Secondary | ICD-10-CM | POA: Diagnosis present

## 2015-07-13 DIAGNOSIS — F418 Other specified anxiety disorders: Secondary | ICD-10-CM | POA: Diagnosis present

## 2015-07-13 DIAGNOSIS — K219 Gastro-esophageal reflux disease without esophagitis: Secondary | ICD-10-CM | POA: Diagnosis present

## 2015-07-13 DIAGNOSIS — Z886 Allergy status to analgesic agent status: Secondary | ICD-10-CM

## 2015-07-13 DIAGNOSIS — Z87891 Personal history of nicotine dependence: Secondary | ICD-10-CM

## 2015-07-13 DIAGNOSIS — J189 Pneumonia, unspecified organism: Secondary | ICD-10-CM

## 2015-07-13 DIAGNOSIS — M549 Dorsalgia, unspecified: Secondary | ICD-10-CM | POA: Diagnosis present

## 2015-07-13 DIAGNOSIS — Z638 Other specified problems related to primary support group: Secondary | ICD-10-CM | POA: Diagnosis not present

## 2015-07-13 DIAGNOSIS — Z590 Homelessness: Secondary | ICD-10-CM

## 2015-07-13 DIAGNOSIS — Z87898 Personal history of other specified conditions: Secondary | ICD-10-CM | POA: Diagnosis not present

## 2015-07-13 DIAGNOSIS — R0902 Hypoxemia: Secondary | ICD-10-CM

## 2015-07-13 DIAGNOSIS — J441 Chronic obstructive pulmonary disease with (acute) exacerbation: Secondary | ICD-10-CM | POA: Diagnosis present

## 2015-07-13 DIAGNOSIS — F4323 Adjustment disorder with mixed anxiety and depressed mood: Secondary | ICD-10-CM | POA: Diagnosis present

## 2015-07-13 DIAGNOSIS — J15212 Pneumonia due to Methicillin resistant Staphylococcus aureus: Secondary | ICD-10-CM | POA: Diagnosis present

## 2015-07-13 DIAGNOSIS — Z885 Allergy status to narcotic agent status: Secondary | ICD-10-CM | POA: Diagnosis not present

## 2015-07-13 DIAGNOSIS — G8929 Other chronic pain: Secondary | ICD-10-CM | POA: Diagnosis present

## 2015-07-13 DIAGNOSIS — I10 Essential (primary) hypertension: Secondary | ICD-10-CM | POA: Diagnosis present

## 2015-07-13 DIAGNOSIS — J9621 Acute and chronic respiratory failure with hypoxia: Secondary | ICD-10-CM | POA: Diagnosis present

## 2015-07-13 DIAGNOSIS — E119 Type 2 diabetes mellitus without complications: Secondary | ICD-10-CM | POA: Diagnosis present

## 2015-07-13 DIAGNOSIS — J440 Chronic obstructive pulmonary disease with acute lower respiratory infection: Secondary | ICD-10-CM | POA: Diagnosis present

## 2015-07-13 DIAGNOSIS — J9620 Acute and chronic respiratory failure, unspecified whether with hypoxia or hypercapnia: Secondary | ICD-10-CM | POA: Diagnosis present

## 2015-07-13 DIAGNOSIS — Z9981 Dependence on supplemental oxygen: Secondary | ICD-10-CM | POA: Diagnosis not present

## 2015-07-13 DIAGNOSIS — I2510 Atherosclerotic heart disease of native coronary artery without angina pectoris: Secondary | ICD-10-CM | POA: Diagnosis present

## 2015-07-13 DIAGNOSIS — Y95 Nosocomial condition: Secondary | ICD-10-CM | POA: Diagnosis present

## 2015-07-13 HISTORY — DX: Depression, unspecified: F32.A

## 2015-07-13 HISTORY — DX: Major depressive disorder, single episode, unspecified: F32.9

## 2015-07-13 HISTORY — DX: Atherosclerotic heart disease of native coronary artery without angina pectoris: I25.10

## 2015-07-13 HISTORY — DX: Pneumonia, unspecified organism: J18.9

## 2015-07-13 LAB — CBC WITH DIFFERENTIAL/PLATELET
BASOS ABS: 0 10*3/uL (ref 0–0.1)
Basophils Relative: 1 %
EOS PCT: 4 %
Eosinophils Absolute: 0.2 10*3/uL (ref 0–0.7)
HEMATOCRIT: 37.9 % — AB (ref 40.0–52.0)
Hemoglobin: 12.7 g/dL — ABNORMAL LOW (ref 13.0–18.0)
LYMPHS PCT: 35 %
Lymphs Abs: 1.7 10*3/uL (ref 1.0–3.6)
MCH: 29.8 pg (ref 26.0–34.0)
MCHC: 33.5 g/dL (ref 32.0–36.0)
MCV: 88.7 fL (ref 80.0–100.0)
MONO ABS: 0.3 10*3/uL (ref 0.2–1.0)
MONOS PCT: 7 %
NEUTROS ABS: 2.5 10*3/uL (ref 1.4–6.5)
Neutrophils Relative %: 53 %
PLATELETS: 132 10*3/uL — AB (ref 150–440)
RBC: 4.28 MIL/uL — ABNORMAL LOW (ref 4.40–5.90)
RDW: 14.9 % — AB (ref 11.5–14.5)
WBC: 4.7 10*3/uL (ref 3.8–10.6)

## 2015-07-13 LAB — BASIC METABOLIC PANEL
ANION GAP: 5 (ref 5–15)
BUN: 10 mg/dL (ref 6–20)
CHLORIDE: 102 mmol/L (ref 101–111)
CO2: 32 mmol/L (ref 22–32)
CREATININE: 1.19 mg/dL (ref 0.61–1.24)
Calcium: 8.6 mg/dL — ABNORMAL LOW (ref 8.9–10.3)
GFR calc Af Amer: >60 mL/min (ref >60)
GFR calc non Af Amer: >60 mL/min (ref >60)
GLUCOSE: 114 mg/dL — AB (ref 65–99)
Potassium: 4.4 mmol/L (ref 3.5–5.1)
Sodium: 139 mmol/L (ref 135–145)

## 2015-07-13 LAB — TROPONIN I

## 2015-07-13 MED ORDER — OXYCODONE HCL ER 20 MG PO T12A
20.0000 mg | EXTENDED_RELEASE_TABLET | Freq: Two times a day (BID) | ORAL | Status: DC
  Administered 2015-07-13 – 2015-07-21 (×16): 20 mg via ORAL
  Filled 2015-07-13 (×16): qty 1

## 2015-07-13 MED ORDER — CODEINE SULFATE 30 MG PO TABS
30.0000 mg | ORAL_TABLET | ORAL | Status: DC | PRN
  Administered 2015-07-17: 30 mg via ORAL
  Filled 2015-07-13: qty 1

## 2015-07-13 MED ORDER — TRAZODONE HCL 100 MG PO TABS
200.0000 mg | ORAL_TABLET | Freq: Every day | ORAL | Status: DC
  Administered 2015-07-13 – 2015-07-20 (×8): 200 mg via ORAL
  Filled 2015-07-13 (×8): qty 2

## 2015-07-13 MED ORDER — LEVOFLOXACIN IN D5W 750 MG/150ML IV SOLN
750.0000 mg | INTRAVENOUS | Status: DC
  Filled 2015-07-13: qty 150

## 2015-07-13 MED ORDER — BUDESONIDE 0.5 MG/2ML IN SUSP
0.5000 mg | Freq: Two times a day (BID) | RESPIRATORY_TRACT | Status: DC
Start: 2015-07-13 — End: 2015-07-21
  Administered 2015-07-13 – 2015-07-21 (×16): 0.5 mg via RESPIRATORY_TRACT
  Filled 2015-07-13 (×17): qty 2

## 2015-07-13 MED ORDER — GABAPENTIN 300 MG PO CAPS
600.0000 mg | ORAL_CAPSULE | Freq: Four times a day (QID) | ORAL | Status: DC
  Administered 2015-07-13 – 2015-07-21 (×30): 600 mg via ORAL
  Filled 2015-07-13 (×7): qty 2
  Filled 2015-07-13: qty 1
  Filled 2015-07-13 (×22): qty 2

## 2015-07-13 MED ORDER — HEPARIN SODIUM (PORCINE) 5000 UNIT/ML IJ SOLN
5000.0000 [IU] | Freq: Three times a day (TID) | INTRAMUSCULAR | Status: DC
  Administered 2015-07-13 – 2015-07-14 (×3): 5000 [IU] via SUBCUTANEOUS
  Filled 2015-07-13 (×3): qty 1

## 2015-07-13 MED ORDER — HYDROCOD POLST-CPM POLST ER 10-8 MG/5ML PO SUER
5.0000 mL | Freq: Two times a day (BID) | ORAL | Status: DC
  Administered 2015-07-13 – 2015-07-21 (×16): 5 mL via ORAL
  Filled 2015-07-13 (×16): qty 5

## 2015-07-13 MED ORDER — LEVOFLOXACIN IN D5W 750 MG/150ML IV SOLN
750.0000 mg | Freq: Once | INTRAVENOUS | Status: AC
  Administered 2015-07-13: 750 mg via INTRAVENOUS
  Filled 2015-07-13: qty 150

## 2015-07-13 MED ORDER — OXYCODONE HCL 5 MG PO TABS
10.0000 mg | ORAL_TABLET | Freq: Once | ORAL | Status: AC
Start: 2015-07-13 — End: 2015-07-13
  Administered 2015-07-13: 10 mg via ORAL
  Filled 2015-07-13: qty 2

## 2015-07-13 MED ORDER — THEOPHYLLINE ER 300 MG PO TB12
300.0000 mg | ORAL_TABLET | Freq: Every day | ORAL | Status: DC
  Administered 2015-07-13 – 2015-07-21 (×9): 300 mg via ORAL
  Filled 2015-07-13 (×10): qty 1

## 2015-07-13 MED ORDER — METHYLPREDNISOLONE SODIUM SUCC 125 MG IJ SOLR
60.0000 mg | Freq: Once | INTRAMUSCULAR | Status: AC
  Administered 2015-07-13: 60 mg via INTRAVENOUS
  Filled 2015-07-13: qty 2

## 2015-07-13 MED ORDER — GABAPENTIN 300 MG PO CAPS
600.0000 mg | ORAL_CAPSULE | Freq: Three times a day (TID) | ORAL | Status: DC
  Administered 2015-07-13: 600 mg via ORAL
  Filled 2015-07-13 (×2): qty 2

## 2015-07-13 MED ORDER — LORATADINE 10 MG PO TABS
10.0000 mg | ORAL_TABLET | Freq: Every day | ORAL | Status: DC
  Administered 2015-07-14 – 2015-07-21 (×8): 10 mg via ORAL
  Filled 2015-07-13 (×8): qty 1

## 2015-07-13 MED ORDER — LABETALOL HCL 5 MG/ML IV SOLN: 10.0000 mg | Freq: Once | INTRAVENOUS | Status: DC

## 2015-07-13 MED ORDER — IPRATROPIUM-ALBUTEROL 0.5-2.5 (3) MG/3ML IN SOLN
3.0000 mL | Freq: Once | RESPIRATORY_TRACT | Status: AC
  Administered 2015-07-13: 3 mL via RESPIRATORY_TRACT
  Filled 2015-07-13: qty 3

## 2015-07-13 MED ORDER — VANCOMYCIN HCL IN DEXTROSE 1-5 GM/200ML-% IV SOLN: 1000.0000 mg | Freq: Once | INTRAVENOUS | Status: DC

## 2015-07-13 MED ORDER — IOHEXOL 350 MG/ML SOLN
75.0000 mL | Freq: Once | INTRAVENOUS | Status: AC | PRN
  Administered 2015-07-13: 75 mL via INTRAVENOUS

## 2015-07-13 MED ORDER — VANCOMYCIN HCL 10 G IV SOLR
1500.0000 mg | Freq: Two times a day (BID) | INTRAVENOUS | Status: DC
  Administered 2015-07-14 – 2015-07-21 (×15): 1500 mg via INTRAVENOUS
  Filled 2015-07-13 (×16): qty 1500

## 2015-07-13 MED ORDER — METHYLPREDNISOLONE SODIUM SUCC 125 MG IJ SOLR
60.0000 mg | Freq: Four times a day (QID) | INTRAMUSCULAR | Status: DC
  Administered 2015-07-14 – 2015-07-15 (×6): 60 mg via INTRAVENOUS
  Filled 2015-07-13 (×6): qty 2

## 2015-07-13 MED ORDER — FLUOXETINE HCL 20 MG PO CAPS
40.0000 mg | ORAL_CAPSULE | Freq: Every day | ORAL | Status: DC
  Administered 2015-07-13 – 2015-07-21 (×9): 40 mg via ORAL
  Filled 2015-07-13 (×9): qty 2

## 2015-07-13 MED ORDER — NICOTINE 21 MG/24HR TD PT24
21.0000 mg | MEDICATED_PATCH | Freq: Every day | TRANSDERMAL | Status: DC
Start: 2015-07-13 — End: 2015-07-15
  Administered 2015-07-14: 21 mg via TRANSDERMAL
  Filled 2015-07-13 (×3): qty 1

## 2015-07-13 MED ORDER — MELATONIN 5 MG PO TABS: 5.0000 mg | ORAL_TABLET | Freq: Every day | ORAL | Status: DC

## 2015-07-13 MED ORDER — SENNOSIDES-DOCUSATE SODIUM 8.6-50 MG PO TABS
1.0000 | ORAL_TABLET | Freq: Every evening | ORAL | Status: DC | PRN
  Administered 2015-07-18 – 2015-07-19 (×2): 1 via ORAL
  Filled 2015-07-13 (×2): qty 1

## 2015-07-13 MED ORDER — VANCOMYCIN HCL 10 G IV SOLR
1500.0000 mg | Freq: Once | INTRAVENOUS | Status: AC
  Administered 2015-07-13: 1500 mg via INTRAVENOUS
  Filled 2015-07-13: qty 1500

## 2015-07-13 MED ORDER — ALBUTEROL SULFATE (2.5 MG/3ML) 0.083% IN NEBU
2.5000 mg | INHALATION_SOLUTION | RESPIRATORY_TRACT | Status: DC | PRN
  Administered 2015-07-14 – 2015-07-20 (×6): 2.5 mg via RESPIRATORY_TRACT
  Filled 2015-07-13 (×7): qty 3

## 2015-07-13 MED ORDER — PRAZOSIN HCL 1 MG PO CAPS
1.0000 mg | ORAL_CAPSULE | Freq: Every day | ORAL | Status: DC
  Administered 2015-07-14 – 2015-07-20 (×7): 1 mg via ORAL
  Filled 2015-07-13 (×10): qty 1

## 2015-07-13 MED ORDER — OXYCODONE HCL 5 MG PO TABS
10.0000 mg | ORAL_TABLET | ORAL | Status: DC | PRN
  Administered 2015-07-14 – 2015-07-21 (×40): 10 mg via ORAL
  Filled 2015-07-13 (×41): qty 2

## 2015-07-13 MED ORDER — BENZONATATE 100 MG PO CAPS
200.0000 mg | ORAL_CAPSULE | Freq: Three times a day (TID) | ORAL | Status: DC | PRN
  Administered 2015-07-15 – 2015-07-21 (×3): 200 mg via ORAL
  Filled 2015-07-13 (×3): qty 2

## 2015-07-13 MED ORDER — GUAIFENESIN ER 600 MG PO TB12
600.0000 mg | ORAL_TABLET | Freq: Two times a day (BID) | ORAL | Status: DC
  Administered 2015-07-13 – 2015-07-21 (×16): 600 mg via ORAL
  Filled 2015-07-13 (×16): qty 1

## 2015-07-13 NOTE — ED Notes (Signed)
Admitting Doctor said to give solumedrol q6 hrs; relayed this to Herbie [place], receiving nurse
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
## 2015-07-13 NOTE — ED Notes (Signed)
Pt presents to ED via EMS from the [facility] to see pulmonologist. O2 sat 87% with questionable infiltrate. Currently  3 L [place] at 94%. Pt states he has had a cough and SOB at times in the last few days. Alert and oriented x4.
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
## 2015-07-13 NOTE — Progress Notes (Signed)

## 2015-07-14 DIAGNOSIS — F4323 Adjustment disorder with mixed anxiety and depressed mood: Secondary | ICD-10-CM

## 2015-07-14 LAB — CBC
HCT: 37.9 % — ABNORMAL LOW (ref 40.0–52.0)
HEMOGLOBIN: 12.3 g/dL — AB (ref 13.0–18.0)
MCH: 28.6 pg (ref 26.0–34.0)
MCHC: 32.6 g/dL (ref 32.0–36.0)
MCV: 87.7 fL (ref 80.0–100.0)
PLATELETS: 131 10*3/uL — AB (ref 150–440)
RBC: 4.33 MIL/uL — AB (ref 4.40–5.90)
RDW: 14.7 % — AB (ref 11.5–14.5)
WBC: 4.3 10*3/uL (ref 3.8–10.6)

## 2015-07-14 LAB — BASIC METABOLIC PANEL
ANION GAP: 6 (ref 5–15)
BUN: 11 mg/dL (ref 6–20)
CALCIUM: 8.2 mg/dL — AB (ref 8.9–10.3)
CO2: 28 mmol/L (ref 22–32)
Chloride: 99 mmol/L — ABNORMAL LOW (ref 101–111)
Creatinine, Ser: 0.95 mg/dL (ref 0.61–1.24)
Glucose, Bld: 228 mg/dL — ABNORMAL HIGH (ref 65–99)
POTASSIUM: 4.6 mmol/L (ref 3.5–5.1)
Sodium: 133 mmol/L — ABNORMAL LOW (ref 135–145)

## 2015-07-14 LAB — GLUCOSE, CAPILLARY
GLUCOSE-CAPILLARY: 168 mg/dL — AB (ref 65–99)
GLUCOSE-CAPILLARY: 234 mg/dL — AB (ref 65–99)

## 2015-07-14 MED ORDER — INSULIN ASPART 100 UNIT/ML ~~LOC~~ SOLN
0.0000 [IU] | Freq: Every day | SUBCUTANEOUS | Status: DC
  Administered 2015-07-14 – 2015-07-19 (×4): 2 [IU] via SUBCUTANEOUS
  Filled 2015-07-14 (×3): qty 2

## 2015-07-14 MED ORDER — INSULIN ASPART 100 UNIT/ML ~~LOC~~ SOLN
0.0000 [IU] | Freq: Three times a day (TID) | SUBCUTANEOUS | Status: DC
  Administered 2015-07-14: 3 [IU] via SUBCUTANEOUS
  Administered 2015-07-15 (×2): 5 [IU] via SUBCUTANEOUS
  Administered 2015-07-15 – 2015-07-16 (×3): 3 [IU] via SUBCUTANEOUS
  Administered 2015-07-16: 11 [IU] via SUBCUTANEOUS
  Administered 2015-07-17 (×2): 8 [IU] via SUBCUTANEOUS
  Administered 2015-07-17: 3 [IU] via SUBCUTANEOUS
  Administered 2015-07-18: 8 [IU] via SUBCUTANEOUS
  Administered 2015-07-18 – 2015-07-19 (×5): 3 [IU] via SUBCUTANEOUS
  Administered 2015-07-20: 5 [IU] via SUBCUTANEOUS
  Administered 2015-07-20: 2 [IU] via SUBCUTANEOUS
  Administered 2015-07-20 – 2015-07-21 (×2): 3 [IU] via SUBCUTANEOUS
  Filled 2015-07-14: qty 3
  Filled 2015-07-14: qty 8
  Filled 2015-07-14: qty 3
  Filled 2015-07-14: qty 5
  Filled 2015-07-14 (×2): qty 3
  Filled 2015-07-14: qty 5
  Filled 2015-07-14: qty 2
  Filled 2015-07-14: qty 3
  Filled 2015-07-14: qty 11
  Filled 2015-07-14: qty 5
  Filled 2015-07-14: qty 8
  Filled 2015-07-14: qty 3
  Filled 2015-07-14: qty 8
  Filled 2015-07-14 (×4): qty 3
  Filled 2015-07-14: qty 5
  Filled 2015-07-14: qty 3
  Filled 2015-07-14: qty 5
  Filled 2015-07-14 (×2): qty 3

## 2015-07-14 MED ORDER — ONDANSETRON HCL 4 MG/2ML IJ SOLN
4.0000 mg | Freq: Four times a day (QID) | INTRAMUSCULAR | Status: DC | PRN
  Administered 2015-07-14 – 2015-07-21 (×16): 4 mg via INTRAVENOUS
  Filled 2015-07-14 (×16): qty 2

## 2015-07-14 MED ORDER — DEXTROSE 5 % IV SOLN
2.0000 g | Freq: Three times a day (TID) | INTRAVENOUS | Status: DC
  Administered 2015-07-14 – 2015-07-16 (×7): 2 g via INTRAVENOUS
  Filled 2015-07-14 (×8): qty 2

## 2015-07-14 MED ORDER — ENOXAPARIN SODIUM 40 MG/0.4ML ~~LOC~~ SOLN
40.0000 mg | SUBCUTANEOUS | Status: DC
  Administered 2015-07-14 – 2015-07-20 (×7): 40 mg via SUBCUTANEOUS
  Filled 2015-07-14 (×7): qty 0.4

## 2015-07-14 MED ORDER — ONDANSETRON HCL 4 MG/2ML IJ SOLN
INTRAMUSCULAR | Status: AC
  Filled 2015-07-14: qty 2

## 2015-07-14 MED ORDER — CLONAZEPAM 0.5 MG PO TBDP
0.5000 mg | ORAL_TABLET | Freq: Two times a day (BID) | ORAL | Status: DC
  Administered 2015-07-15 (×2): 0.5 mg via ORAL
  Filled 2015-07-14 (×2): qty 1
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
## 2015-07-14 NOTE — Clinical Social Work Note (Signed)
CSW contacted Chrys Racer again at [facility] to let her know that patient has MRSA in sputum. No concern expressed by Chrys Racer other than stating that she may move him to another room. Shela Leff MSW,LCSW [phone]
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
## 2015-07-14 NOTE — NC FL2 (Signed)
[place] LEVEL OF CARE SCREENING TOOL     IDENTIFICATION  Patient Name: Steven CHASON Sr. Birthdate: Aug 23, 1955 Sex: male Admission Date (Current Location): 07/13/2015  [facility] and [place] Number:  Engineering geologist and Address:  [facility], [address], [place],  [address]      Provider Number: [id number]  Attending Physician Name and Address:  Demetrios Loll, MD  Relative Name and Phone Number:       Current Level of Care: Hospital Recommended Level of Care: Old Agency Prior Approval Number:    Date Approved/Denied:   PASRR Number:    Discharge Plan: SNF    Current Diagnoses: Patient Active Problem List   Diagnosis Date Noted  . Pneumonia 07/13/2015  . MRSA pneumonia ([place]) 07/13/2015  . Adjustment disorder with mixed anxiety and depressed mood 06/29/2015  . Acute on chronic respiratory failure with hypoxia ([place])   . Pain in the chest   . Chronic back pain   . Umbilical hernia   . Chest pain 06/18/2015  . Intractable back pain 04/20/2015  . Acute on chronic respiratory failure with hypoxemia ([place]) 01/03/2015  . Alcohol dependence with uncomplicated withdrawal ([place])   . Major depressive disorder, recurrent, severe without psychotic features ([place])   . Uncomplicated alcohol dependence ([place])   . MDD (major depressive disorder), recurrent episode, severe ([place]) 06/24/2014  . Alcohol abuse 06/24/2014  . Alcohol dependence ([place]) 06/24/2014  . Alcohol dependence with withdrawal, uncomplicated ([place]) [id number]  . Major depression, single episode 06/23/2014  . Suicidal ideations 06/23/2014  . Laryngeal pain 04/30/2014  . Neurosis, posttraumatic 02/24/2014  . Back ache 02/22/2014  . Chronic depression 02/22/2014  . Personal history of traumatic fracture 02/22/2014  . Coughing 01/23/2013  . COPD (chronic obstructive pulmonary disease) ([place]) 11/13/2012  . Abnormal CXR 11/13/2012  . Drug-seeking behavior  11/13/2012  . Odynophagia 10/31/2012  . Oral thrush 10/31/2012  . Acute-on-chronic respiratory failure ([place]) 10/29/2012  . COPD exacerbation ([place]) 10/28/2012  . Adrenal insufficiency ([place]) 10/28/2012  . Acute adrenal crisis ([place]) 10/28/2012  . Pituitary adenoma ([place]) 10/28/2012  . Anxiety disorder 10/28/2012    Orientation RESPIRATION BLADDER Height & Weight    Self, Time, Situation, Place  Normal Continent   210 lbs.  BEHAVIORAL SYMPTOMS/MOOD NEUROLOGICAL BOWEL NUTRITION STATUS   (none)  (none) Continent Diet  AMBULATORY STATUS COMMUNICATION OF NEEDS Skin   Supervision Verbally  (leg ulcerations)                       Personal Care Assistance Level of Assistance    Bathing Assistance: Independent   Dressing Assistance: Independent     Functional Limitations Info   (none)          SPECIAL CARE FACTORS FREQUENCY                       Contractures Contractures Info: Not present    Additional Factors Info  Code Status Code Status Info: Full Code             Current Medications (07/14/2015):  This is the current hospital active medication list Current Facility-Administered Medications  Medication Dose Route Frequency Provider Last Rate Last Dose  . albuterol (PROVENTIL) (2.5 MG/3ML) 0.083% nebulizer solution 2.5 mg  2.5 mg Nebulization Q4H PRN Vaughan Basta, MD   2.5 mg at 07/14/15 0718  . benzonatate (TESSALON) capsule 200 mg  200 mg Oral TID PRN  Vaughan Basta, MD      . budesonide (PULMICORT) nebulizer solution 0.5 mg  0.5 mg Nebulization BID Vaughan Basta, MD   0.5 mg at 07/14/15 0719  . chlorpheniramine-HYDROcodone (TUSSIONEX) 10-8 MG/5ML suspension 5 mL  5 mL Oral Q12H Vaughan Basta, MD   5 mL at 07/14/15 0954  . codeine tablet 30 mg  30 mg Oral Q4H PRN Vaughan Basta, MD      . FLUoxetine (PROZAC) capsule 40 mg  40 mg Oral Daily Vaughan Basta, MD   40 mg at 07/14/15 0954  . gabapentin (NEURONTIN)  capsule 600 mg  600 mg Oral QID Vaughan Basta, MD   600 mg at 07/14/15 0954  . guaiFENesin (MUCINEX) 12 hr tablet 600 mg  600 mg Oral BID Vaughan Basta, MD   600 mg at 07/14/15 0954  . heparin injection 5,000 Units  5,000 Units Subcutaneous 3 times per day Vaughan Basta, MD   5,000 Units at 07/14/15 0509  . levofloxacin (LEVAQUIN) IVPB 750 mg  750 mg Intravenous Q24H Crystal G Scarpena, RPH      . loratadine (CLARITIN) tablet 10 mg  10 mg Oral Daily Vaughan Basta, MD   10 mg at 07/14/15 0954  . methylPREDNISolone sodium succinate (SOLU-MEDROL) 125 mg/2 mL injection 60 mg  60 mg Intravenous Q6H Vaughan Basta, MD   60 mg at 07/14/15 0800  . nicotine (NICODERM CQ - dosed in mg/24 hours) patch 21 mg  21 mg Transdermal Daily Vaughan Basta, MD   21 mg at 07/14/15 0954  . oxyCODONE (Oxy IR/ROXICODONE) immediate release tablet 10 mg  10 mg Oral Q4H PRN Vaughan Basta, MD   10 mg at 07/14/15 1124  . oxyCODONE (OXYCONTIN) 12 hr tablet 20 mg  20 mg Oral Q12H Vaughan Basta, MD   20 mg at 07/14/15 0954  . prazosin (MINIPRESS) capsule 1 mg  1 mg Oral QHS Vaughan Basta, MD   1 mg at 07/13/15 2247  . senna-docusate (Senokot-S) tablet 1 tablet  1 tablet Oral QHS PRN Vaughan Basta, MD      . theophylline (THEODUR) 12 hr tablet 300 mg  300 mg Oral Daily Vaughan Basta, MD   300 mg at 07/14/15 0954  . traZODone (DESYREL) tablet 200 mg  200 mg Oral QHS Vaughan Basta, MD   200 mg at 07/13/15 2235  . vancomycin (VANCOCIN) 1,500 mg in sodium chloride 0.9 % 500 mL IVPB  1,500 mg Intravenous Q12H Vaughan Basta, MD   1,500 mg at 07/14/15 0509     Discharge Medications: Please see discharge summary for a list of discharge medications.  Relevant Imaging Results:  Relevant Lab Results:   Additional Information    Shela Leff, LCSW

## 2015-07-14 NOTE — Progress Notes (Signed)
Inpatient Diabetes Program Recommendations  AACE/ADA: New Consensus Statement on Inpatient Glycemic Control (2015)  Target Ranges:  Prepandial:   less than 140 mg/dL      Peak postprandial:   less than 180 mg/dL (1-2 hours)      Critically ill patients:  140 - 180 mg/dL  Results for HEINRICH, GERDES SR. (MRN [id number]) as of 07/14/2015 08:39  Ref. Range 07/13/2015 15:05 07/14/2015 06:40  Glucose Latest Ref Range: 65-99 mg/dL 114 (H) 228 (H)   Review of Glycemic Control   Diabetes history: NO Current orders for Inpatient glycemic control: None  Inpatient Diabetes Program Recommendations: Correction (SSI): Patient does not have history of diabetes. Patient is ordered Solumedrol 60 mg Q6H which is likely cause of hyperglycemia. While inpatient and ordered steroids, please consider ordering CBGs with Novolog correction scale.  Thanks, Barnie Alderman, RN, MSN, CDE Diabetes Coordinator Inpatient Diabetes Program [phone] (Team Pager from [place] to [place]) [phone] (AP office) [phone] [facility] office) [phone] [facility] office)

## 2015-07-15 LAB — GLUCOSE, CAPILLARY
Glucose-Capillary: 235 mg/dL — ABNORMAL HIGH (ref 65–99)
Glucose-Capillary: 205 mg/dL — ABNORMAL HIGH (ref 65–99)
Glucose-Capillary: 179 mg/dL — ABNORMAL HIGH (ref 65–99)
Glucose-Capillary: 197 mg/dL — ABNORMAL HIGH (ref 65–99)

## 2015-07-15 LAB — VANCOMYCIN, TROUGH: Vancomycin Tr: 14 ug/mL (ref 10–20)

## 2015-07-15 MED ORDER — METHYLPREDNISOLONE SODIUM SUCC 125 MG IJ SOLR
60.0000 mg | Freq: Two times a day (BID) | INTRAMUSCULAR | Status: DC
  Administered 2015-07-16 – 2015-07-21 (×11): 60 mg via INTRAVENOUS
  Filled 2015-07-15 (×12): qty 2

## 2015-07-15 MED ORDER — NICOTINE 14 MG/24HR TD PT24
14.0000 mg | MEDICATED_PATCH | Freq: Every day | TRANSDERMAL | Status: DC
  Administered 2015-07-15 – 2015-07-21 (×7): 14 mg via TRANSDERMAL
  Filled 2015-07-15 (×7): qty 1

## 2015-07-15 MED ORDER — CLONAZEPAM 0.5 MG PO TBDP
1.0000 mg | ORAL_TABLET | Freq: Two times a day (BID) | ORAL | Status: DC
  Administered 2015-07-15 – 2015-07-21 (×12): 1 mg via ORAL
  Filled 2015-07-15 (×12): qty 2

## 2015-07-15 NOTE — Progress Notes (Signed)
Pharmacy Antibiotic Follow-up Note  Steven Wiley. is a 60 y.o. year-old male admitted on 07/13/2015.  The patient is currently on day 3 of vancomycin & cefepime for pneumonia.  Assessment/Plan: This patient's current antibiotics will be continued without adjustments.  Continue cefepime 2 g IV q 8 hours per renal function and indication.  Vancomycin trough obtained at 1615 today was 14 mcg/mL. Vancomycin trough goal is 15-20 mcg/mL. Will continue with current regimen of vancomycin 1500 mg IV q 12 hours since patient is very close to therapeutic goal and patient will likely accumulate due to size (actual body weight is 39% greater than ideal body weight).   Temp (24hrs), Avg:97.9 F (36.6 C), Min:97.6 F (36.4 C), Max:98.4 F (36.9 C)   Recent Labs Lab 07/13/15 1505 07/14/15 0640  WBC 4.7 4.3    Recent Labs Lab 07/13/15 1505 07/14/15 0640  CREATININE 1.19 0.95   Estimated Creatinine Clearance: 92.6 mL/min (by C-G formula based on Cr of 0.95).    Allergies  Allergen Reactions  . Spiriva [Tiotropium Bromide Monohydrate] Other (See Comments)    Reaction:  Lung congestion  . Acetaminophen Hives and Nausea And Vomiting  . Advair Diskus [Fluticasone-Salmeterol] Other (See Comments)    Reaction:  Lung congestion  . Hydrocodone-Acetaminophen Hives and Nausea And Vomiting  . Penicillins Hives and Other (See Comments)    Reaction:  Blisters  Unable to obtain enough information to answer additional questions about this medication.     Antimicrobials this admission: vancomycin 1/16 >>  cefepime 1/16 >>   Levels/dose changes this admission: 1/18 VT = 14 mcg/mL on 1500 mg IV q 12 hour regimen. Continue.  Microbiology results: 1/16 BCx: No growth 2 days  Thank you for allowing pharmacy to be a part of this patient's care.  Lenis Noon PharmD Clinical Pharmacist 07/15/2015 6:20 PM

## 2015-07-15 NOTE — Progress Notes (Signed)
Spoke to Dr. Bridgett Larsson okay to place order for Nicotine patch to be decreased to 14mg  per pt request.
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
## 2015-07-15 NOTE — Consult Note (Signed)
  Psychiatry: Follow-up for this 60 year old man with chronic anxiety and respiratory failure. Patient interviewed chart reviewed. Patient tells me that his nerves are feeling better today. His tremor is decreased. Still feels like he gets anxious more than normal and feels that the medicine isn't lasting long enough. Shortness of breath is a little bit better. Overall feels more optimistic less panicky.  Patient had been started on Klonopin 0.5 mg twice a day. He suggest that we increase it to 1 mg twice a day. I'm willing to go ahead and do that. We will follow-up tomorrow and make sure he is not oversedated. Psychoeducation and encouragement offered. No other change to treatment plan for now.

## 2015-07-15 NOTE — Progress Notes (Signed)
Inpatient Diabetes Program Recommendations  AACE/ADA: New Consensus Statement on Inpatient Glycemic Control (2015)  Target Ranges:  Prepandial:   less than 140 mg/dL      Peak postprandial:   less than 180 mg/dL (1-2 hours)      Critically ill patients:  140 - 180 mg/dL  Results for SKYE, MCCLAMMY SR. (MRN [id number]) as of 07/15/2015 12:56  Ref. Range 07/14/2015 17:43 07/14/2015 22:00 07/15/2015 07:39 07/15/2015 11:28  Glucose-Capillary Latest Ref Range: 65-99 mg/dL 168 (H)  Novolog 3 units 234 (H)  Novolog 2 units 235 (H)  Novolog 5 units 205 (H)  Novolog 5 units   Review of Glycemic Control  Diabetes history: NO Current orders for Inpatient glycemic control: Novolog 0-15 units TID with meals, Novolog 0-5 units HS  Inpatient Diabetes Program Recommendations: Basal insulin: If Solumedrol 60 mg Q6H is continued as ordered, please consider ordering Lantus 5 units QHS. Please note that if basal insulin is ordered as recommended, it will likely need to be adjusted as steroids are tapered.  Thanks, Barnie Alderman, RN, MSN, CDE Diabetes Coordinator Inpatient Diabetes Program [phone] (Team Pager from [place] to [place]) [phone] ([facility] office) [phone] [facility] office) [phone] [facility] office)

## 2015-07-16 LAB — EXPECTORATED SPUTUM ASSESSMENT W REFEX TO RESP CULTURE

## 2015-07-16 LAB — GLUCOSE, CAPILLARY
GLUCOSE-CAPILLARY: 156 mg/dL — AB (ref 65–99)
GLUCOSE-CAPILLARY: 314 mg/dL — AB (ref 65–99)
Glucose-Capillary: 180 mg/dL — ABNORMAL HIGH (ref 65–99)
Glucose-Capillary: 174 mg/dL — ABNORMAL HIGH (ref 65–99)

## 2015-07-16 LAB — EXPECTORATED SPUTUM ASSESSMENT W GRAM STAIN, RFLX TO RESP C

## 2015-07-16 NOTE — Progress Notes (Signed)
Per MD patient will likely be ready for D/C tomorrow. Clinical Education officer, museum (CSW) contacted [employer] [facility] and made her aware of above. Per Chrys Racer patient can return when stable. CSW will continue to follow and assist as needed.   Blima Rich, LCSW [phone]
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
## 2015-07-16 NOTE — Progress Notes (Signed)
Pharmacy Antibiotic Follow-up Note  Steven Wiley. is a 60 y.o. year-old male admitted on 07/13/2015.  The patient is currently on day 4 of vancomycin & cefepime for pneumonia.  Assessment/Plan: This patient's current antibiotics will be continued without adjustments.  Continue cefepime 2 g IV q 8 hours per renal function and indication.  Vancomycin trough obtained at 1615 today was 14 mcg/mL. Vancomycin trough goal is 15-20 mcg/mL. Will continue with current regimen of vancomycin 1500 mg IV q 12 hours since patient is very close to therapeutic goal and patient will likely accumulate due to size (actual body weight is 39% greater than ideal body weight).   Temp (24hrs), Avg:97.8 F (36.6 C), Min:97.7 F (36.5 C), Max:97.8 F (36.6 C)   Recent Labs Lab 07/13/15 1505 07/14/15 0640  WBC 4.7 4.3     Recent Labs Lab 07/13/15 1505 07/14/15 0640  CREATININE 1.19 0.95   Estimated Creatinine Clearance: 92.6 mL/min (by C-G formula based on Cr of 0.95).    Allergies  Allergen Reactions  . Spiriva [Tiotropium Bromide Monohydrate] Other (See Comments)    Reaction:  Lung congestion  . Acetaminophen Hives and Nausea And Vomiting  . Advair Diskus [Fluticasone-Salmeterol] Other (See Comments)    Reaction:  Lung congestion  . Hydrocodone-Acetaminophen Hives and Nausea And Vomiting  . Penicillins Hives and Other (See Comments)    Reaction:  Blisters  Unable to obtain enough information to answer additional questions about this medication.     Antimicrobials this admission: vancomycin 1/16 >>  cefepime 1/16 >>   Levels/dose changes this admission: 1/18 VT = 14 mcg/mL on 1500 mg IV q 12 hour regimen. Recheck trough in 3 days for monitoring purposes.   Microbiology results: 1/16 BCx: No growth 2 days  Thank you for allowing pharmacy to be a part of this patient's care.  Loleta Dicker PharmD Clinical Pharmacist 07/16/2015 3:27 PM

## 2015-07-17 LAB — GLUCOSE, CAPILLARY
GLUCOSE-CAPILLARY: 203 mg/dL — AB (ref 65–99)
Glucose-Capillary: 170 mg/dL — ABNORMAL HIGH (ref 65–99)
Glucose-Capillary: 276 mg/dL — ABNORMAL HIGH (ref 65–99)
Glucose-Capillary: 224 mg/dL — ABNORMAL HIGH (ref 65–99)

## 2015-07-17 LAB — CREATININE, SERUM
CREATININE: 0.89 mg/dL (ref 0.61–1.24)
GFR calc Af Amer: >60 mL/min (ref >60)

## 2015-07-17 MED ORDER — DOCUSATE SODIUM 100 MG PO CAPS
100.0000 mg | ORAL_CAPSULE | Freq: Two times a day (BID) | ORAL | Status: DC
  Administered 2015-07-17 – 2015-07-21 (×9): 100 mg via ORAL
  Filled 2015-07-17 (×10): qty 1

## 2015-07-17 MED ORDER — INSULIN DETEMIR 100 UNIT/ML ~~LOC~~ SOLN
10.0000 [IU] | Freq: Every day | SUBCUTANEOUS | Status: DC
  Administered 2015-07-17 – 2015-07-20 (×4): 10 [IU] via SUBCUTANEOUS
  Filled 2015-07-17 (×6): qty 0.1

## 2015-07-17 MED ORDER — LACTULOSE 10 GM/15ML PO SOLN
20.0000 g | Freq: Every day | ORAL | Status: DC | PRN
  Administered 2015-07-19: 20 g via ORAL
  Filled 2015-07-17: qty 30

## 2015-07-17 NOTE — Progress Notes (Signed)
Per MD patient is not medically stable for D/C today. Clinical Education officer, museum (CSW) contacted [employer] at [facility] and made her aware of above. Per Chrys Racer patient can be admitted to the facility over the weekend. CSW can faxed D/C Summary to fax: [phone] if patient is ready for D/C over the weekend. CSW will continue to follow and assist as needed.   Blima Rich, LCSW [phone]

## 2015-07-17 NOTE — Progress Notes (Signed)
Pharmacy Antibiotic Follow-up Note  Thierry Rittenberry. is a 60 y.o. year-old male admitted on 07/13/2015.  The patient is currently on day 5 of vancomycin for pneumonia.  Assessment/Plan: This patient's current antibiotics will be continued without adjustments.  Cefepime discontinued by MD Bridgett Larsson   Vancomycin trough obtained at 1615 on 1/9  was 14 mcg/mL. Vancomycin trough goal is 15-20 mcg/mL. Current regimen of vancomycin 1500 mg IV q 12 hours was continued since patient was very close to therapeutic goal and patient will likely accumulate due to size (actual body weight is 39% greater than ideal body weight).   Temp (24hrs), Avg:97.3 F (36.3 C), Min:96.7 F (35.9 C), Max:98 F (36.7 C)   Recent Labs Lab 07/13/15 1505 07/14/15 0640  WBC 4.7 4.3     Recent Labs Lab 07/13/15 1505 07/14/15 0640 07/17/15 0608  CREATININE 1.19 0.95 0.89   Estimated Creatinine Clearance: 98.9 mL/min (by C-G formula based on Cr of 0.89).    Allergies  Allergen Reactions  . Spiriva [Tiotropium Bromide Monohydrate] Other (See Comments)    Reaction:  Lung congestion  . Acetaminophen Hives and Nausea And Vomiting  . Advair Diskus [Fluticasone-Salmeterol] Other (See Comments)    Reaction:  Lung congestion  . Hydrocodone-Acetaminophen Hives and Nausea And Vomiting  . Penicillins Hives and Other (See Comments)    Reaction:  Blisters  Unable to obtain enough information to answer additional questions about this medication.     Antimicrobials this admission: vancomycin 1/16 >>  cefepime 1/16 >>   Levels/dose changes this admission: 1/18 VT = 14 mcg/mL on 1500 mg IV q 12 hour regimen. Vancomycin trough level ordered to be drawn @ 16:30 on 1/21.   Microbiology results: 1/16 BCx: No growth 2 days  Thank you for allowing pharmacy to be a part of this patient's care.  Dicie Beam D PharmD Clinical Pharmacist 07/17/2015 11:09 AM
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
## 2015-07-18 LAB — BASIC METABOLIC PANEL
ANION GAP: 6 (ref 5–15)
BUN: 26 mg/dL — ABNORMAL HIGH (ref 6–20)
CHLORIDE: 101 mmol/L (ref 101–111)
CO2: 32 mmol/L (ref 22–32)
Calcium: 8.9 mg/dL (ref 8.9–10.3)
Creatinine, Ser: 0.79 mg/dL (ref 0.61–1.24)
GFR calc Af Amer: >60 mL/min (ref >60)
GFR calc non Af Amer: >60 mL/min (ref >60)
GLUCOSE: 221 mg/dL — AB (ref 65–99)
Potassium: 4.7 mmol/L (ref 3.5–5.1)
Sodium: 139 mmol/L (ref 135–145)

## 2015-07-18 LAB — CULTURE, BLOOD (ROUTINE X 2)
CULTURE: NO GROWTH
CULTURE: NO GROWTH

## 2015-07-18 LAB — GLUCOSE, CAPILLARY
GLUCOSE-CAPILLARY: 290 mg/dL — AB (ref 65–99)
GLUCOSE-CAPILLARY: 158 mg/dL — AB (ref 65–99)
GLUCOSE-CAPILLARY: 218 mg/dL — AB (ref 65–99)
Glucose-Capillary: 158 mg/dL — ABNORMAL HIGH (ref 65–99)
Glucose-Capillary: 195 mg/dL — ABNORMAL HIGH (ref 65–99)

## 2015-07-18 LAB — MAGNESIUM: Magnesium: 2.2 mg/dL (ref 1.7–2.4)

## 2015-07-18 LAB — VANCOMYCIN, TROUGH: Vancomycin Tr: 16 ug/mL (ref 10–20)

## 2015-07-18 LAB — HEMOGLOBIN A1C: Hgb A1c MFr Bld: 6.5 % — ABNORMAL HIGH (ref 4.0–6.0)

## 2015-07-18 MED ORDER — ARFORMOTEROL TARTRATE 15 MCG/2ML IN NEBU
15.0000 ug | INHALATION_SOLUTION | Freq: Two times a day (BID) | RESPIRATORY_TRACT | Status: DC
  Administered 2015-07-18 – 2015-07-21 (×5): 15 ug via RESPIRATORY_TRACT
  Filled 2015-07-18 (×9): qty 2

## 2015-07-18 NOTE — Progress Notes (Signed)
Entered in error

## 2015-07-18 NOTE — Progress Notes (Signed)
Pharmacy Antibiotic Follow-up Note  Steven Wiley. is a 60 y.o. year-old male admitted on 07/13/2015.  The patient is currently on day 5 of vancomycin for pneumonia.  Assessment/Plan: This patient's current antibiotics will be continued without adjustments.  Cefepime discontinued by MD Bridgett Larsson   Vancomycin trough obtained at 1615 on 1/9  was 14 mcg/mL. Vancomycin trough goal is 15-20 mcg/mL. Current regimen of vancomycin 1500 mg IV q 12 hours was continued since patient was very close to therapeutic goal and patient will likely accumulate due to size (actual body weight is 39% greater than ideal body weight).   Temp (24hrs), Avg:97.8 F (36.6 C), Min:97.4 F (36.3 C), Max:98.2 F (36.8 C)   Recent Labs Lab 07/13/15 1505 07/14/15 0640  WBC 4.7 4.3     Recent Labs Lab 07/13/15 1505 07/14/15 0640 07/17/15 0608 07/18/15 0337  CREATININE 1.19 0.95 0.89 0.79   Estimated Creatinine Clearance: 110 mL/min (by C-G formula based on Cr of 0.79).    Allergies  Allergen Reactions  . Spiriva [Tiotropium Bromide Monohydrate] Other (See Comments)    Reaction:  Lung congestion  . Acetaminophen Hives and Nausea And Vomiting  . Advair Diskus [Fluticasone-Salmeterol] Other (See Comments)    Reaction:  Lung congestion  . Hydrocodone-Acetaminophen Hives and Nausea And Vomiting  . Penicillins Hives and Other (See Comments)    Reaction:  Blisters  Unable to obtain enough information to answer additional questions about this medication.     Antimicrobials this admission: vancomycin 1/16 >>  cefepime 1/16 >>   Levels/dose changes this admission: 1/18 VT = 14 mcg/mL on 1500 mg IV q 12 hour regimen. Vancomycin trough level ordered to be drawn @ 16:30 on 1/21.   Microbiology results: 1/16 BCx: No growth 2 days  1/21:  Vanc trough @ 16:17 = 16 mcg/mL Will continue this pt on current dose of 1500 mg IV Q12H.   Violeta Gelinas D PharmD Clinical Pharmacist 07/18/2015 5:18 PM

## 2015-07-18 NOTE — Progress Notes (Signed)
Patient request multiple ice pops and ice cream. Patient educated on increase risk of high blood sugars. Patient states that he can eat what he wants to. Patient complaints of N/V controlled with PRN medication. Pain controlled with PRN and scheduled medication.

## 2015-07-18 NOTE — Progress Notes (Signed)
Patient ID: Steven HARCUM Sr., male   DOB: 08-03-1955, 60 y.o.   MRN: [id number] [facility] PROGRESS NOTE  PCP: Marden Noble, MD  HPI/Subjective: Patient with shortness of breath. He was recently in the hospital with MRSA growing in the sputum culture. Patient states that he did not get his medication at his facility. Still feels very short of breath. Patient is well-known to your service from previous hospitalizations.  Objective: Filed Vitals:   07/18/15 0354 07/18/15 0803  BP: 131/69 131/78  Pulse: 73 73  Temp: 98.2 F (36.8 C) 97.4 F (36.3 C)  Resp: 19 16    Filed Weights   07/13/15 1414  Weight: 95.255 kg (210 lb)    ROS: Review of Systems  Constitutional: Negative for fever and chills.  Eyes: Negative for blurred vision.  Respiratory: Positive for cough, shortness of breath and wheezing.   Cardiovascular: Negative for chest pain.  Gastrointestinal: Negative for nausea, vomiting, abdominal pain, diarrhea and constipation.  Genitourinary: Negative for dysuria.  Musculoskeletal: Negative for joint pain.  Neurological: Negative for dizziness and headaches.   Exam: Physical Exam  Constitutional: He is oriented to person, place, and time.  HENT:  Nose: No mucosal edema.  Mouth/Throat: No oropharyngeal exudate or posterior oropharyngeal edema.  Eyes: Conjunctivae, EOM and lids are normal. Pupils are equal, round, and reactive to light.  Neck: No JVD present. Carotid bruit is not present. No edema present. No thyroid mass and no thyromegaly present.  Cardiovascular: S1 normal and S2 normal.  Exam reveals no gallop.   No murmur heard. Pulses:      Dorsalis pedis pulses are 2+ on the right side, and 2+ on the left side.  Respiratory: No respiratory distress. He has wheezes in the right upper field, the right middle field, the right lower field, the left upper field, the left middle field and the left lower field. He has no rhonchi. He has no rales.  GI:  Soft. Bowel sounds are normal. There is no tenderness.  Musculoskeletal:       Right ankle: He exhibits no swelling.       Left ankle: He exhibits no swelling.  Lymphadenopathy:    He has no cervical adenopathy.  Neurological: He is alert and oriented to person, place, and time. No cranial nerve deficit.  Skin: Skin is warm. No rash noted. Nails show no clubbing.  Psychiatric: He has a normal mood and affect.      Data Reviewed: Basic Metabolic Panel:  Recent Labs Lab 07/13/15 1505 07/14/15 0640 07/17/15 0608 07/18/15 0337  NA 139 133*  --  139  K 4.4 4.6  --  4.7  CL 102 99*  --  101  CO2 32 28  --  32  GLUCOSE 114* 228*  --  221*  BUN 10 11  --  26*  CREATININE 1.19 0.95 0.89 0.79  CALCIUM 8.6* 8.2*  --  8.9  MG  --   --   --  2.2   CBC:  Recent Labs Lab 07/13/15 1505 07/14/15 0640  WBC 4.7 4.3  NEUTROABS 2.5  --   HGB 12.7* 12.3*  HCT 37.9* 37.9*  MCV 88.7 87.7  PLT 132* 131*   CBG:  Recent Labs Lab 07/17/15 1127 07/17/15 1657 07/17/15 2116 07/18/15 0805 07/18/15 1124  GLUCAP 276* 203* 224* 158* 290*    Recent Results (from the past 240 hour(s))  Culture, blood (routine x 2)     Status: None  Collection Time: 07/13/15  6:33 PM  Result Value Ref Range Status   Specimen Description BLOOD LEFT ARM  Final   Special Requests   Final    BOTTLES DRAWN AEROBIC AND ANAEROBIC 6CC AERO 5CC ANA   Culture NO GROWTH 5 DAYS  Final   Report Status 07/18/2015 FINAL  Final  Culture, blood (routine x 2)     Status: None   Collection Time: 07/13/15  6:38 PM  Result Value Ref Range Status   Specimen Description BLOOD RIGHT ARM  Final   Special Requests   Final    BOTTLES DRAWN AEROBIC AND ANAEROBIC [place] AERO 5CC ANA   Culture NO GROWTH 5 DAYS  Final   Report Status 07/18/2015 FINAL  Final  Culture, expectorated sputum-assessment     Status: None   Collection Time: 07/16/15 11:09 AM  Result Value Ref Range Status   Specimen Description EXPECTORATED SPUTUM   Final   Special Requests NONE  Final   Sputum evaluation THIS SPECIMEN IS ACCEPTABLE FOR SPUTUM CULTURE  Final   Report Status 07/16/2015 FINAL  Final  Culture, respiratory (NON-Expectorated)     Status: None (Preliminary result)   Collection Time: 07/16/15 11:09 AM  Result Value Ref Range Status   Specimen Description EXPECTORATED SPUTUM  Final   Special Requests NONE Reflexed from [id number]  Final   Gram Stain   Final    POOR SPECIMEN - LESS THAN 70% WBCS RARE WBC SEEN NO ORGANISMS SEEN    Culture Consistent with normal respiratory flora.  Final   Report Status PENDING  Incomplete     Scheduled Meds: . arformoterol  15 mcg Nebulization BID  . budesonide  0.5 mg Nebulization BID  . chlorpheniramine-HYDROcodone  5 mL Oral Q12H  . clonazepam  1 mg Oral BID  . docusate sodium  100 mg Oral BID  . enoxaparin (LOVENOX) injection  40 mg Subcutaneous Q24H  . FLUoxetine  40 mg Oral Daily  . gabapentin  600 mg Oral QID  . guaiFENesin  600 mg Oral BID  . insulin aspart  0-15 Units Subcutaneous TID WC  . insulin aspart  0-5 Units Subcutaneous QHS  . insulin detemir  10 Units Subcutaneous QHS  . loratadine  10 mg Oral Daily  . methylPREDNISolone (SOLU-MEDROL) injection  60 mg Intravenous Q12H  . nicotine  14 mg Transdermal Daily  . oxyCODONE  20 mg Oral Q12H  . prazosin  1 mg Oral QHS  . theophylline  300 mg Oral Daily  . traZODone  200 mg Oral QHS  . vancomycin  1,500 mg Intravenous Q12H   Assessment/Plan:  1. Healthcare acquired pneumonia. Recent MRSA positive sputum culture. Patient is on vancomycin. 2. COPD exacerbation continue Solu-Medrol and nebulizer treatments including budesonide and Brovana. Patient always has a poor respiratory effort when he is in the hospital in [place] wheeze wheezes. 3. Chronic pain continue usual pain medications 4. Acute on chronic respiratory failure on chronic oxygen 5. Type 2 diabetes mellitus continue detemir insulin and sliding scale 6. Anxiety  depression on fluoxetine and clonazepam  Code Status:     Code Status Orders        Start     Ordered   07/13/15 2021  Full code   Continuous     07/13/15 2020    Code Status History    Date Active Date Inactive Code Status Order ID Comments User Context   06/18/2015  5:45 PM 06/30/2015  6:03 PM Full Code [id number]  Nicholes Mango, MD Inpatient   04/20/2015  8:26 PM 04/25/2015  4:52 PM Full Code [id number]  Jani Gravel, MD Inpatient   01/03/2015  4:57 PM 01/09/2015  7:51 PM Full Code [id number]  Epifanio Lesches, MD ED   06/23/2014  3:20 PM 07/02/2014  4:39 PM Full Code [id number]  Waylan Boga, NP Inpatient   06/22/2014  6:13 PM 06/23/2014  3:20 PM Full Code [id number]  Virgel Manifold, MD ED     Disposition Plan: Once breathing better back to facility  Antibiotics:  Vancomycin  Time spent: 25 minutes  Loletha Grayer  [facility]

## 2015-07-19 LAB — GLUCOSE, CAPILLARY
GLUCOSE-CAPILLARY: 171 mg/dL — AB (ref 65–99)
GLUCOSE-CAPILLARY: 153 mg/dL — AB (ref 65–99)
Glucose-Capillary: 203 mg/dL — ABNORMAL HIGH (ref 65–99)

## 2015-07-19 MED ORDER — LEVOFLOXACIN 500 MG PO TABS
750.0000 mg | ORAL_TABLET | Freq: Every day | ORAL | Status: DC
  Administered 2015-07-19 – 2015-07-21 (×3): 750 mg via ORAL
  Filled 2015-07-19 (×3): qty 1

## 2015-07-19 MED ORDER — NYSTATIN 100000 UNIT/ML MT SUSP
5.0000 mL | Freq: Four times a day (QID) | OROMUCOSAL | Status: DC
  Administered 2015-07-19 – 2015-07-21 (×8): 500000 [IU] via ORAL
  Filled 2015-07-19 (×8): qty 5

## 2015-07-19 MED ORDER — FLUCONAZOLE 100 MG PO TABS
100.0000 mg | ORAL_TABLET | Freq: Every day | ORAL | Status: DC
  Administered 2015-07-19 – 2015-07-21 (×3): 100 mg via ORAL
  Filled 2015-07-19 (×3): qty 1

## 2015-07-19 NOTE — Progress Notes (Signed)
Patient ID: Steven TYBURSKI Sr., male   DOB: 31-Jul-1955, 60 y.o.   MRN: [id number] [facility] PROGRESS NOTE  PCP: Steven Noble, MD  HPI/Subjective: Patient still feels the same as yesterday. Still not moving good air. Still feels short of breath. Still coughing. Still wheezing.  Objective: Filed Vitals:   07/19/15 0450 07/19/15 0749  BP: 130/83 149/77  Pulse: 78 71  Temp: 98.2 F (36.8 C) 98.1 F (36.7 C)  Resp: 18 17    Filed Weights   07/13/15 1414  Weight: 95.255 kg (210 lb)    ROS: Review of Systems  Constitutional: Negative for fever and chills.  Eyes: Negative for blurred vision.  Respiratory: Positive for cough, shortness of breath and wheezing.   Cardiovascular: Negative for chest pain.  Gastrointestinal: Negative for nausea, vomiting, abdominal pain, diarrhea and constipation.  Genitourinary: Negative for dysuria.  Musculoskeletal: Negative for joint pain.  Neurological: Negative for dizziness and headaches.   Exam: Physical Exam  Constitutional: He is oriented to person, place, and time.  HENT:  Nose: No mucosal edema.  Mouth/Throat: No oropharyngeal exudate or posterior oropharyngeal edema.  Eyes: Conjunctivae, EOM and lids are normal. Pupils are equal, round, and reactive to light.  Neck: No JVD present. Carotid bruit is not present. No edema present. No thyroid mass and no thyromegaly present.  Cardiovascular: S1 normal and S2 normal.  Exam reveals no gallop.   No murmur heard. Pulses:      Dorsalis pedis pulses are 2+ on the right side, and 2+ on the left side.  Respiratory: No respiratory distress. He has wheezes in the right upper field, the right middle field, the right lower field, the left upper field, the left middle field and the left lower field. He has no rhonchi. He has no rales.  GI: Soft. Bowel sounds are normal. There is no tenderness.  Musculoskeletal:       Right ankle: He exhibits no swelling.       Left ankle: He  exhibits no swelling.  Lymphadenopathy:    He has no cervical adenopathy.  Neurological: He is alert and oriented to person, place, and time. No cranial nerve deficit.  Skin: Skin is warm. No rash noted. Nails show no clubbing.  Psychiatric: He has a normal mood and affect.      Data Reviewed: Basic Metabolic Panel:  Recent Labs Lab 07/13/15 1505 07/14/15 0640 07/17/15 0608 07/18/15 0337  NA 139 133*  --  139  K 4.4 4.6  --  4.7  CL 102 99*  --  101  CO2 32 28  --  32  GLUCOSE 114* 228*  --  221*  BUN 10 11  --  26*  CREATININE 1.19 0.95 0.89 0.79  CALCIUM 8.6* 8.2*  --  8.9  MG  --   --   --  2.2   CBC:  Recent Labs Lab 07/13/15 1505 07/14/15 0640  WBC 4.7 4.3  NEUTROABS 2.5  --   HGB 12.7* 12.3*  HCT 37.9* 37.9*  MCV 88.7 87.7  PLT 132* 131*   CBG:  Recent Labs Lab 07/18/15 1124 07/18/15 1646 07/18/15 2053 07/18/15 2238 07/19/15 1108  GLUCAP 290* 158* 195* 218* 171*    Recent Results (from the past 240 hour(s))  Culture, blood (routine x 2)     Status: None   Collection Time: 07/13/15  6:33 PM  Result Value Ref Range Status   Specimen Description BLOOD LEFT ARM  Final  Special Requests   Final    BOTTLES DRAWN AEROBIC AND ANAEROBIC 6CC AERO 5CC ANA   Culture NO GROWTH 5 DAYS  Final   Report Status 07/18/2015 FINAL  Final  Culture, blood (routine x 2)     Status: None   Collection Time: 07/13/15  6:38 PM  Result Value Ref Range Status   Specimen Description BLOOD RIGHT ARM  Final   Special Requests   Final    BOTTLES DRAWN AEROBIC AND ANAEROBIC [place] AERO 5CC ANA   Culture NO GROWTH 5 DAYS  Final   Report Status 07/18/2015 FINAL  Final  Culture, expectorated sputum-assessment     Status: None   Collection Time: 07/16/15 11:09 AM  Result Value Ref Range Status   Specimen Description EXPECTORATED SPUTUM  Final   Special Requests NONE  Final   Sputum evaluation THIS SPECIMEN IS ACCEPTABLE FOR SPUTUM CULTURE  Final   Report Status 07/16/2015  FINAL  Final  Culture, respiratory (NON-Expectorated)     Status: None (Preliminary result)   Collection Time: 07/16/15 11:09 AM  Result Value Ref Range Status   Specimen Description EXPECTORATED SPUTUM  Final   Special Requests NONE Reflexed from [id number]  Final   Gram Stain   Final    POOR SPECIMEN - LESS THAN 70% WBCS RARE WBC SEEN NO ORGANISMS SEEN    Culture   Final    [facility]W MOLD [facility]ENT [facility] FOR CONFIRMATION FEW NORMAL UPPER RESPIRATORY FLORA    Report Status PENDING  Incomplete     Scheduled Meds: . arformoterol  15 mcg Nebulization BID  . budesonide  0.5 mg Nebulization BID  . chlorpheniramine-HYDROcodone  5 mL Oral Q12H  . clonazepam  1 mg Oral BID  . docusate sodium  100 mg Oral BID  . enoxaparin (LOVENOX) injection  40 mg Subcutaneous Q24H  . fluconazole  100 mg Oral Daily  . FLUoxetine  40 mg Oral Daily  . gabapentin  600 mg Oral QID  . guaiFENesin  600 mg Oral BID  . insulin aspart  0-15 Units Subcutaneous TID WC  . insulin aspart  0-5 Units Subcutaneous QHS  . insulin detemir  10 Units Subcutaneous QHS  . levofloxacin  750 mg Oral Daily  . loratadine  10 mg Oral Daily  . methylPREDNISolone (SOLU-MEDROL) injection  60 mg Intravenous Q12H  . nicotine  14 mg Transdermal Daily  . nystatin  5 mL Oral QID  . oxyCODONE  20 mg Oral Q12H  . prazosin  1 mg Oral QHS  . theophylline  300 mg Oral Daily  . traZODone  200 mg Oral QHS  . vancomycin  1,500 mg Intravenous Q12H   Assessment/Plan:  1. Healthcare acquired pneumonia. Recent MRSA positive sputum culture. Patient is on vancomycin. 2. COPD exacerbation continue Solu-Medrol and nebulizer treatments including budesonide and Brovana. Patient always has a poor respiratory effort when he is in the hospital.  3. Chronic pain continue usual pain medications 4. Acute on chronic respiratory failure on chronic oxygen 5. Type 2 diabetes mellitus continue detemir insulin and sliding scale 6. Anxiety depression on  fluoxetine and clonazepam  Code Status:     Code Status Orders        Start     Ordered   07/13/15 2021  Full code   Continuous     07/13/15 2020    Code Status History    Date Active Date Inactive Code Status Order ID Comments User Context   06/18/2015  5:45 PM 06/30/2015  6:03 PM Full Code [id number]  Nicholes Mango, MD Inpatient   04/20/2015  8:26 PM 04/25/2015  4:52 PM Full Code [id number]  Jani Gravel, MD Inpatient   01/03/2015  4:57 PM 01/09/2015  7:51 PM Full Code [id number]  Epifanio Lesches, MD ED   06/23/2014  3:20 PM 07/02/2014  4:39 PM Full Code [id number]  Waylan Boga, NP Inpatient   06/22/2014  6:13 PM 06/23/2014  3:20 PM Full Code [id number]  Virgel Manifold, MD ED     Disposition Plan: Once breathing better back to facility  Antibiotics:  Vancomycin  Time spent: 22 minutes  Loletha Grayer  [facility]

## 2015-07-19 NOTE — Progress Notes (Signed)
Physical Therapy Evaluation Patient Details Name: Steven SAFER Sr. MRN: [id number] DOB: 1955-11-27 Today's Date: 07/19/2015   History of Present Illness  Pt is 60 yo male with onset of PNA complaints and MRSA in his lungs.  Has been referred to PT and has been a psychiatric patient.  Clinical Impression  Pt was able to walk in BR with no HHA and stand even to lower surfaces with no assistance even when available.  He does have times where he is using more support and grabbing to surfaces with excessive reach and effort.  Due to inconsistencies will keep him on PT caseload to see if he will be able to consistently transfer and walk.    Follow Up Recommendations SNF    Equipment Recommendations  None recommended by PT    Recommendations for Other Services Rehab consult     Precautions / Restrictions Precautions Precautions: Fall Precaution Comments: Pt demonstrates inconsistencies with mobility first walking and squatting down with no HHA and then asking for help to walk Restrictions Weight Bearing Restrictions: No      Mobility  Bed Mobility Overal bed mobility: Modified Independent                Transfers Overall transfer level: Independent Equipment used: 1 person hand held assist (Pt does not use PT for WBing but to squeeze hand excessively) Transfers: Sit to/from [facility] Sit to Stand: Independent Stand pivot transfers: Independent       General transfer comment: cued pt to use wall rail in BR and he did not  Ambulation/Gait Ambulation/Gait assistance: Min guard;Min assist Ambulation Distance (Feet): 40 Feet Assistive device: 1 person hand held assist Gait Pattern/deviations: Step-through pattern;Staggering left;Staggering right;Wide base of support;Drifts right/left (excessive reaching and grabbing surfaces, then walks alone) Gait velocity: extremely slow Gait velocity interpretation: Below normal speed for age/gender General Gait  Details: extremely slow, effortful movement with trunk flexed and heavy use of BUEs. Sounds SOB, O2 saturation maintains 92-94%  Stairs            Wheelchair Mobility    Modified Rankin (Stroke Patients Only)       Balance   Sitting-balance support: Feet supported Sitting balance-Leahy Scale: Normal     Standing balance support: Single extremity supported Standing balance-Leahy Scale: Fair                               Pertinent Vitals/Pain Pain Assessment: No/denies pain    Home Living Family/patient expects to be discharged to:: Private residence Living Arrangements: Alone   Type of Home: House Home Access: Stairs to enter Entrance Stairs-Rails: Right Entrance Stairs-Number of Steps: 5-6 Home Layout: One level Home Equipment: Walker - 2 wheels;Walker - 4 wheels;Cane - single point      Prior Function Level of Independence: Independent with assistive device(s)               Hand Dominance        Extremity/Trunk Assessment                         Communication   Communication: No difficulties  Cognition Arousal/Alertness: Awake/alert Behavior During Therapy: Impulsive (inconsistent) Overall Cognitive Status: Difficult to assess                      General Comments General comments (skin integrity, edema, etc.): PT asked pt to sit  up in chair to eat his meal, and pt proceeded to sit with no help to chair, including no HHA on chair to control sitting.    Exercises        Assessment/Plan    PT Assessment Patient needs continued PT services  PT Diagnosis Difficulty walking   PT Problem List Decreased activity tolerance;Decreased mobility;Obesity;Decreased range of motion  PT Treatment Interventions DME instruction;Gait training;Functional mobility training;Therapeutic activities;Balance training;Therapeutic exercise;Neuromuscular re-education;Patient/family education   PT Goals (Current goals can be found in  the Care Plan section) Acute Rehab PT Goals Patient Stated Goal: to go to SNF PT Goal Formulation: With patient Time For Goal Achievement: 07/26/15 Potential to Achieve Goals: Good    Frequency Min 2X/week   Barriers to discharge Decreased caregiver support      Co-evaluation               End of Session   Activity Tolerance: Patient tolerated treatment well (O2 sats were 93% on room air after walking) Patient left: in chair;with call bell/phone within reach Nurse Communication: Mobility status         Time: [id number] PT Time Calculation (min) (ACUTE ONLY): 33 min   Charges:   PT Evaluation $PT Eval Low Complexity: 1 Procedure PT Treatments $Gait Training: 8-22 mins   PT G Codes:        Ramond Dial 07-30-15, 2:19 PM   Mee Hives, PT MS Acute Rehab Dept. Number: [facility] [phone] and [place] [phone]

## 2015-07-19 NOTE — Progress Notes (Signed)
Pt is alert and oriented x 4, irritable at times. C/o constant chest/ back pain improved with q4h prn medications. FSBS performed due to steroid usage, sliding scale coverage given, on room air, stable vital signs, worked with physical therapy, bm throughout shift, on antibiotics. On isolation precautions for past history of MRSA in sputum, sputum was not positive for MRSA this admission.

## 2015-07-19 NOTE — Progress Notes (Signed)
FSBS 158 per NT Deloise

## 2015-07-20 LAB — GLUCOSE, CAPILLARY
GLUCOSE-CAPILLARY: 138 mg/dL — AB (ref 65–99)
GLUCOSE-CAPILLARY: 170 mg/dL — AB (ref 65–99)
Glucose-Capillary: 202 mg/dL — ABNORMAL HIGH (ref 65–99)
Glucose-Capillary: 182 mg/dL — ABNORMAL HIGH (ref 65–99)

## 2015-07-20 LAB — CULTURE, RESPIRATORY: CULTURE: NORMAL

## 2015-07-20 LAB — CULTURE, RESPIRATORY W GRAM STAIN

## 2015-07-20 NOTE — Progress Notes (Signed)
Clinical Social Worker (CSW) discussed case with MD. Per MD patient is still wheezing. CSW contacted [facility] admissions coordinator at [facility] and made her aware of above. Per Chrys Racer patient can come to [facility] when stable for D/C.   Blima Rich, LCSW [phone]

## 2015-07-20 NOTE — Progress Notes (Signed)
Physical Therapy Treatment Patient Details Name: Steven STUECK Sr. MRN: [id number] DOB: March 06, 1956 Today's Date: 07/20/2015    History of Present Illness Pt is 60 yo male with onset of PNA complaints and MRSA in his lungs.  Has been referred to PT and has been a psychiatric patient.    PT Comments    Pt demonstrates very slow and guarded ambulation but no evidence for instability. He is unable to ambulate further on this date due to reports of fatigue. Pt is able to complete seated exercises at EOB as instructed. Vitals remain WNL throughout treatment session. Pt will benefit from skilled PT services to address deficits in strength, balance, and mobility in order to return to full function at home.    Follow Up Recommendations  SNF     Equipment Recommendations  None recommended by PT    Recommendations for Other Services Rehab consult     Precautions / Restrictions Precautions Precautions: Fall Restrictions Weight Bearing Restrictions: No    Mobility  Bed Mobility Overal bed mobility: Modified Independent Bed Mobility: Supine to Sit     Supine to sit: Modified independent (Device/Increase time);HOB elevated     General bed mobility comments: Use of bed rails. Good sequencing demonstrated  Transfers Overall transfer level: Needs assistance Equipment used: Rolling walker (2 wheeled) Transfers: Sit to/from Stand Sit to Stand: Min guard         General transfer comment: Pt rises slowly but with good stability. No overt LOB noted during transfer but increased time required indicative of decreased LE strength/power  Ambulation/Gait Ambulation/Gait assistance: Min guard Ambulation Distance (Feet): 40 Feet Assistive device: Rolling walker (2 wheeled)   Gait velocity: Decreased Gait velocity interpretation: <1.8 ft/sec, indicative of risk for recurrent falls General Gait Details: Partial step-through gait pattern. Slow and effortful gait with increased respiratory  rate. Pt reports 5/10 on RPE. SaO2 remains >90% throughout ambulation on 3L/min O2 and HR remains <90 bpm. Fatigue monitored throughout ambulation   Stairs            Wheelchair Mobility    Modified Rankin (Stroke Patients Only)       Balance Overall balance assessment: Needs assistance   Sitting balance-Leahy Scale: Normal       Standing balance-Leahy Scale: Fair                      Cognition Arousal/Alertness: Awake/alert Behavior During Therapy: WFL for tasks assessed/performed Overall Cognitive Status: Within Functional Limits for tasks assessed                      Exercises General Exercises - Lower Extremity Hip ABduction/ADduction: Strengthening;Both;5 reps;Seated Hip Flexion/Marching: Strengthening;Both;5 reps;Seated    General Comments        Pertinent Vitals/Pain Pain Assessment: 0-10 Pain Score: 6  Pain Location: Abdomen and back. Pt reports chronic pain and new onset pain from hernia Pain Intervention(s): Monitored during session;Premedicated before session    Home Living                      Prior Function            PT Goals (current goals can now be found in the care plan section) Acute Rehab PT Goals Patient Stated Goal: to go to SNF PT Goal Formulation: With patient Time For Goal Achievement: 07/26/15 Potential to Achieve Goals: Good Progress towards PT goals: Progressing toward goals    Frequency  Min  2X/week    PT Plan Current plan remains appropriate    Co-evaluation             End of Session Equipment Utilized During Treatment: Gait belt Activity Tolerance: Patient tolerated treatment well Patient left: in bed;with bed alarm set;with call bell/phone within reach (Sitting at EOB with meal. RN notified)     Time: [id number] PT Time Calculation (min) (ACUTE ONLY): 15 min  Charges:  $Therapeutic Exercise: 8-22 mins                    G Codes:      Lyndel Safe Huprich PT, DPT    Huprich,Jason 07/20/2015, 5:24 PM

## 2015-07-20 NOTE — Progress Notes (Signed)
Patient ID: Steven WILLE Sr., male   DOB: 12-05-1955, 60 y.o.   MRN: [id number] [facility] PROGRESS NOTE  PCP: Marden Noble, MD  HPI/Subjective: Patient still short of breath and coughing. He feels a little bit better than yesterday. Still coughing up brown phlegm.  Objective: Filed Vitals:   07/20/15 0527 07/20/15 0810  BP: 147/78 137/83  Pulse: 63 63  Temp: 97.4 F (36.3 C) 97.5 F (36.4 C)  Resp: 18 18    Filed Weights   07/13/15 1414  Weight: 95.255 kg (210 lb)    ROS: Review of Systems  Constitutional: Negative for fever and chills.  Eyes: Negative for blurred vision.  Respiratory: Positive for cough, shortness of breath and wheezing.   Cardiovascular: Negative for chest pain.  Gastrointestinal: Negative for nausea, vomiting, abdominal pain, diarrhea and constipation.  Genitourinary: Negative for dysuria.  Musculoskeletal: Negative for joint pain.  Neurological: Negative for dizziness and headaches.   Exam: Physical Exam  Constitutional: He is oriented to person, place, and time.  HENT:  Nose: No mucosal edema.  Mouth/Throat: No oropharyngeal exudate or posterior oropharyngeal edema.  Eyes: Conjunctivae, EOM and lids are normal. Pupils are equal, round, and reactive to light.  Neck: No JVD present. Carotid bruit is not present. No edema present. No thyroid mass and no thyromegaly present.  Cardiovascular: S1 normal and S2 normal.  Exam reveals no gallop.   No murmur heard. Pulses:      Dorsalis pedis pulses are 2+ on the right side, and 2+ on the left side.  Respiratory: No respiratory distress. He has decreased breath sounds in the right lower field and the left lower field. He has wheezes in the right lower field and the left lower field. He has no rhonchi. He has no rales.  Upper airway transmitted wheeze  GI: Soft. Bowel sounds are normal. There is no tenderness.  Musculoskeletal:       Right ankle: He exhibits no swelling.       Left  ankle: He exhibits no swelling.  Lymphadenopathy:    He has no cervical adenopathy.  Neurological: He is alert and oriented to person, place, and time. No cranial nerve deficit.  Skin: Skin is warm. No rash noted. Nails show no clubbing.  Psychiatric: He has a normal mood and affect.      Data Reviewed: Basic Metabolic Panel:  Recent Labs Lab 07/14/15 0640 07/17/15 0608 07/18/15 0337  NA 133*  --  139  K 4.6  --  4.7  CL 99*  --  101  CO2 28  --  32  GLUCOSE 228*  --  221*  BUN 11  --  26*  CREATININE 0.95 0.89 0.79  CALCIUM 8.2*  --  8.9  MG  --   --  2.2   CBC:  Recent Labs Lab 07/14/15 0640  WBC 4.3  HGB 12.3*  HCT 37.9*  MCV 87.7  PLT 131*   CBG:  Recent Labs Lab 07/19/15 1108 07/19/15 1606 07/19/15 2116 07/20/15 0822 07/20/15 1119  GLUCAP 171* 153* 203* 138* 202*    Recent Results (from the past 240 hour(s))  Culture, blood (routine x 2)     Status: None   Collection Time: 07/13/15  6:33 PM  Result Value Ref Range Status   Specimen Description BLOOD LEFT ARM  Final   Special Requests   Final    BOTTLES DRAWN AEROBIC AND ANAEROBIC 6CC AERO 5CC ANA   Culture NO GROWTH  5 DAYS  Final   Report Status 07/18/2015 FINAL  Final  Culture, blood (routine x 2)     Status: None   Collection Time: 07/13/15  6:38 PM  Result Value Ref Range Status   Specimen Description BLOOD RIGHT ARM  Final   Special Requests   Final    BOTTLES DRAWN AEROBIC AND ANAEROBIC Crisp AERO 5CC ANA   Culture NO GROWTH 5 DAYS  Final   Report Status 07/18/2015 FINAL  Final  Culture, expectorated sputum-assessment     Status: None   Collection Time: 07/16/15 11:09 AM  Result Value Ref Range Status   Specimen Description EXPECTORATED SPUTUM  Final   Special Requests NONE  Final   Sputum evaluation THIS SPECIMEN IS ACCEPTABLE FOR SPUTUM CULTURE  Final   Report Status 07/16/2015 FINAL  Final  Culture, respiratory (NON-Expectorated)     Status: None   Collection Time: 07/16/15 11:09  AM  Result Value Ref Range Status   Specimen Description EXPECTORATED SPUTUM  Final   Special Requests NONE Reflexed from [id number]  Final   Gram Stain   Final    POOR SPECIMEN - LESS THAN 70% WBCS RARE WBC SEEN NO ORGANISMS SEEN    Culture Consistent with normal respiratory flora.  Final   Report Status 07/20/2015 FINAL  Final     Scheduled Meds: . arformoterol  15 mcg Nebulization BID  . budesonide  0.5 mg Nebulization BID  . chlorpheniramine-HYDROcodone  5 mL Oral Q12H  . clonazepam  1 mg Oral BID  . docusate sodium  100 mg Oral BID  . enoxaparin (LOVENOX) injection  40 mg Subcutaneous Q24H  . fluconazole  100 mg Oral Daily  . FLUoxetine  40 mg Oral Daily  . gabapentin  600 mg Oral QID  . guaiFENesin  600 mg Oral BID  . insulin aspart  0-15 Units Subcutaneous TID WC  . insulin aspart  0-5 Units Subcutaneous QHS  . insulin detemir  10 Units Subcutaneous QHS  . levofloxacin  750 mg Oral Daily  . loratadine  10 mg Oral Daily  . methylPREDNISolone (SOLU-MEDROL) injection  60 mg Intravenous Q12H  . nicotine  14 mg Transdermal Daily  . nystatin  5 mL Oral QID  . oxyCODONE  20 mg Oral Q12H  . prazosin  1 mg Oral QHS  . theophylline  300 mg Oral Daily  . traZODone  200 mg Oral QHS  . vancomycin  1,500 mg Intravenous Q12H   Assessment/Plan:  1. Healthcare acquired pneumonia. Recent MRSA positive sputum culture. Patient is on vancomycin, Levaquin. 2. COPD exacerbation continue Solu-Medrol and nebulizer treatments including budesonide and Brovana. Patient moving better air than yesterday. 3. Chronic pain continue usual pain medications 4. Acute on chronic respiratory failure on chronic oxygen 5. Type 2 diabetes mellitus continue detemir insulin and sliding scale 6. Anxiety depression on fluoxetine and clonazepam  Code Status:     Code Status Orders        Start     Ordered   07/13/15 2021  Full code   Continuous     07/13/15 2020    Code Status History    Date Active  Date Inactive Code Status Order ID Comments User Context   06/18/2015  5:45 PM 06/30/2015  6:03 PM Full Code [id number]  Nicholes Mango, MD Inpatient   04/20/2015  8:26 PM 04/25/2015  4:52 PM Full Code [id number]  Jani Gravel, MD Inpatient   01/03/2015  4:57 PM 01/09/2015  7:51 PM  Full Code [id number]  Epifanio Lesches, MD ED   06/23/2014  3:20 PM 07/02/2014  4:39 PM Full Code [id number]  Waylan Boga, NP Inpatient   06/22/2014  6:13 PM 06/23/2014  3:20 PM Full Code [id number]  Virgel Manifold, MD ED     Disposition Plan: Once breathing better back to facility  Antibiotics:  Vancomycin  Time spent: 24 minutes  Loletha Grayer  [facility]

## 2015-07-21 LAB — CREATININE, SERUM
CREATININE: 0.84 mg/dL (ref 0.61–1.24)
GFR calc Af Amer: >60 mL/min (ref >60)

## 2015-07-21 LAB — GLUCOSE, CAPILLARY: Glucose-Capillary: 172 mg/dL — ABNORMAL HIGH (ref 65–99)

## 2015-07-21 MED ORDER — OXYCODONE HCL ER 20 MG PO T12A: 20.0000 mg | EXTENDED_RELEASE_TABLET | Freq: Two times a day (BID) | ORAL | Status: DC

## 2015-07-21 MED ORDER — FLUTICASONE FUROATE-VILANTEROL 100-25 MCG/INH IN AEPB: 1.0000 | INHALATION_SPRAY | Freq: Every day | RESPIRATORY_TRACT | Status: DC

## 2015-07-21 MED ORDER — ALBUTEROL SULFATE HFA 108 (90 BASE) MCG/ACT IN AERS: 2.0000 | INHALATION_SPRAY | Freq: Four times a day (QID) | RESPIRATORY_TRACT | Status: DC | PRN

## 2015-07-21 MED ORDER — CLONAZEPAM 1 MG PO TBDP: 1.0000 mg | ORAL_TABLET | Freq: Two times a day (BID) | ORAL | Status: DC

## 2015-07-21 MED ORDER — HYDROCOD POLST-CPM POLST ER 10-8 MG/5ML PO SUER: 5.0000 mL | Freq: Two times a day (BID) | ORAL | Status: DC | PRN

## 2015-07-21 MED ORDER — PRAZOSIN HCL 1 MG PO CAPS: 1.0000 mg | ORAL_CAPSULE | Freq: Every day | ORAL | Status: DC

## 2015-07-21 MED ORDER — SENNOSIDES-DOCUSATE SODIUM 8.6-50 MG PO TABS: 1.0000 | ORAL_TABLET | Freq: Every evening | ORAL | Status: DC | PRN

## 2015-07-21 MED ORDER — PREDNISONE 5 MG PO TABS: ORAL_TABLET | ORAL | Status: DC

## 2015-07-21 MED ORDER — BENZONATATE 200 MG PO CAPS: 200.0000 mg | ORAL_CAPSULE | Freq: Three times a day (TID) | ORAL | Status: DC | PRN

## 2015-07-21 MED ORDER — TRAZODONE HCL 100 MG PO TABS: 200.0000 mg | ORAL_TABLET | Freq: Every day | ORAL | Status: AC

## 2015-07-21 MED ORDER — FLUOXETINE HCL 40 MG PO CAPS: 40.0000 mg | ORAL_CAPSULE | Freq: Every day | ORAL | Status: DC

## 2015-07-21 MED ORDER — LIDOCAINE 5 % EX PTCH: 1.0000 | MEDICATED_PATCH | CUTANEOUS | Status: DC

## 2015-07-21 MED ORDER — CLINDAMYCIN HCL 300 MG PO CAPS: ORAL_CAPSULE | ORAL | Status: DC

## 2015-07-21 MED ORDER — GABAPENTIN 300 MG PO CAPS: 600.0000 mg | ORAL_CAPSULE | Freq: Four times a day (QID) | ORAL | Status: DC

## 2015-07-21 MED ORDER — NYSTATIN 100000 UNIT/ML MT SUSP: 5.0000 mL | Freq: Four times a day (QID) | OROMUCOSAL | Status: DC

## 2015-07-21 MED ORDER — OXYCODONE HCL 10 MG PO TABS: 10.0000 mg | ORAL_TABLET | ORAL | Status: DC | PRN

## 2015-07-21 MED ORDER — THEOPHYLLINE ER 300 MG PO TB12: 300.0000 mg | ORAL_TABLET | Freq: Every day | ORAL | Status: DC

## 2015-07-21 MED ORDER — ALBUTEROL SULFATE (2.5 MG/3ML) 0.083% IN NEBU: 2.5000 mg | INHALATION_SOLUTION | RESPIRATORY_TRACT | Status: AC | PRN

## 2015-07-21 MED ORDER — POLYETHYLENE GLYCOL 3350 17 G PO PACK: 17.0000 g | PACK | Freq: Every day | ORAL | Status: DC | PRN

## 2015-07-21 MED ORDER — NICOTINE 21 MG/24HR TD PT24: 21.0000 mg | MEDICATED_PATCH | Freq: Every day | TRANSDERMAL | Status: DC

## 2015-07-21 NOTE — Progress Notes (Signed)
Patient is medically stable for D/C back to [facility] today. Per [facility] admissions coordinator at [facility] patient can return today to room 416-B. RN will call report to RN on 400 hall and arrange EMS for transport. Clinical Education officer, museum (CSW) sent D/C Summary, FL2 and D/C Packet to [employer] via [employer]. Patient is aware of above. Per patient his family is coming around 40 - 10:30 this morning to drop off some clothes for him. Patient reported that CSW does not have to notify anybody in his family of D/C because he has already done so. Please reconsult if future social work needs arise. CSW signing off.   Blima Rich, LCSW [phone]

## 2015-07-21 NOTE — Progress Notes (Signed)
EMS called for patient to be transported to [facility].

## 2015-07-21 NOTE — Progress Notes (Signed)
EMS staff here to transport patient to [facility].
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
## 2015-07-21 NOTE — Discharge Instructions (Signed)
Patient wears oxygen 24/7 3 liters Community-Acquired Pneumonia, Adult Pneumonia is an infection of the lungs. There are different types of pneumonia. One type can develop while a person is in a hospital. A different type, called community-acquired pneumonia, develops in people who are not, or have not recently been, in the hospital or other health care facility.  CAUSES Pneumonia may be caused by bacteria, viruses, or funguses. Community-acquired pneumonia is often caused by Streptococcus pneumonia bacteria. These bacteria are often passed from one person to another by breathing in droplets from the cough or sneeze of an infected person. RISK FACTORS The condition is more likely to develop in:  People who havechronic diseases, such as chronic obstructive pulmonary disease (COPD), asthma, congestive heart failure, cystic fibrosis, diabetes, or kidney disease.  People who haveearly-stage or late-stage HIV.  People who havesickle cell disease.  People who havehad their spleen removed (splenectomy).  People who havepoor Human resources officer.  People who havemedical conditions that increase the risk of breathing in (aspirating) secretions their own mouth and nose.   People who havea weakened immune system (immunocompromised).  People who smoke.  People whotravel to areas where pneumonia-causing germs commonly exist.  People whoare around animal habitats or animals that have pneumonia-causing germs, including birds, bats, rabbits, cats, and farm animals. SYMPTOMS Symptoms of this condition include:  Adry cough.  A wet (productive) cough.  Fever.  Sweating.  Chest pain, especially when breathing deeply or coughing.  Rapid breathing or difficulty breathing.  Shortness of breath.  Shaking chills.  Fatigue.  Muscle aches. DIAGNOSIS Your health care provider will take a medical history and perform a physical exam. You may also have other tests, including:  Imaging  studies of your chest, including X-rays.  Tests to check your blood oxygen level and other blood gases.  Other tests on blood, mucus (sputum), fluid around your lungs (pleural fluid), and urine. If your pneumonia is severe, other tests may be done to identify the specific cause of your illness. TREATMENT The type of treatment that you receive depends on many factors, such as the cause of your pneumonia, the medicines you take, and other medical conditions that you have. For most adults, treatment and recovery from pneumonia may occur at home. In some cases, treatment must happen in a hospital. Treatment may include:  Antibiotic medicines, if the pneumonia was caused by bacteria.  Antiviral medicines, if the pneumonia was caused by a virus.  Medicines that are given by mouth or through an IV tube.  Oxygen.  Respiratory therapy. Although rare, treating severe pneumonia may include:  Mechanical ventilation. This is done if you are not breathing well on your own and you cannot maintain a safe blood oxygen level.  Thoracentesis. This procedureremoves fluid around one lung or both lungs to help you breathe better. HOME CARE INSTRUCTIONS  Take over-the-counter and prescription medicines only as told by your health care provider.  Only takecough medicine if you are losing sleep. Understand that cough medicine can prevent your body's natural ability to remove mucus from your lungs.  If you were prescribed an antibiotic medicine, take it as told by your health care provider. Do not stop taking the antibiotic even if you start to feel better.  Sleep in a semi-upright position at night. Try sleeping in a reclining chair, or place a few pillows under your head.  Do not use tobacco products, including cigarettes, chewing tobacco, and e-cigarettes. If you need help quitting, ask your health care provider.  Drink  enough water to keep your urine clear or pale yellow. This will help to thin out  mucus secretions in your lungs. PREVENTION There are ways that you can decrease your risk of developing community-acquired pneumonia. Consider getting a pneumococcal vaccine if:  You are older than 61 years of age.  You are older than 61 years of age and are undergoing cancer treatment, have chronic lung disease, or have other medical conditions that affect your immune system. Ask your health care provider if this applies to you. There are different types and schedules of pneumococcal vaccines. Ask your health care provider which vaccination option is best for you. You may also prevent community-acquired pneumonia if you take these actions:  Get an influenza vaccine every year. Ask your health care provider which type of influenza vaccine is best for you.  Go to the dentist on a regular basis.  Wash your hands often. Use hand sanitizer if soap and water are not available. SEEK MEDICAL CARE IF:  You have a fever.  You are losing sleep because you cannot control your cough with cough medicine. SEEK IMMEDIATE MEDICAL CARE IF:  You have worsening shortness of breath.  You have increased chest pain.  Your sickness becomes worse, especially if you are an older adult or have a weakened immune system.  You cough up blood.   This information is not intended to replace advice given to you by your health care provider. Make sure you discuss any questions you have with your health care provider.   Document Released: 06/13/2005 Document Revised: 03/04/2015 Document Reviewed: 10/08/2014 Elsevier Interactive Patient Education [employer].

## 2015-07-21 NOTE — Progress Notes (Signed)
Report called to [place] at [facility]. Waiting for EMS.

## 2015-08-19 ENCOUNTER — Emergency Department: Payer: Medicaid Other

## 2015-08-19 ENCOUNTER — Emergency Department
Admission: EM | Admit: 2015-08-19 | Discharge: 2015-08-20 | Disposition: A | Discharge status: Home / Self Care | Payer: Medicaid Other | Attending: Emergency Medicine | Admitting: Emergency Medicine

## 2015-08-19 ENCOUNTER — Encounter: Payer: Self-pay | Admitting: Emergency Medicine

## 2015-08-19 DIAGNOSIS — F17210 Nicotine dependence, cigarettes, uncomplicated: Secondary | ICD-10-CM | POA: Diagnosis not present

## 2015-08-19 DIAGNOSIS — Z7951 Long term (current) use of inhaled steroids: Secondary | ICD-10-CM | POA: Insufficient documentation

## 2015-08-19 DIAGNOSIS — Z7952 Long term (current) use of systemic steroids: Secondary | ICD-10-CM | POA: Insufficient documentation

## 2015-08-19 DIAGNOSIS — I10 Essential (primary) hypertension: Secondary | ICD-10-CM | POA: Diagnosis not present

## 2015-08-19 DIAGNOSIS — J441 Chronic obstructive pulmonary disease with (acute) exacerbation: Secondary | ICD-10-CM | POA: Insufficient documentation

## 2015-08-19 DIAGNOSIS — J9611 Chronic respiratory failure with hypoxia: Secondary | ICD-10-CM | POA: Diagnosis not present

## 2015-08-19 DIAGNOSIS — Z792 Long term (current) use of antibiotics: Secondary | ICD-10-CM | POA: Diagnosis not present

## 2015-08-19 DIAGNOSIS — Z9981 Dependence on supplemental oxygen: Secondary | ICD-10-CM | POA: Diagnosis not present

## 2015-08-19 DIAGNOSIS — Z79899 Other long term (current) drug therapy: Secondary | ICD-10-CM | POA: Insufficient documentation

## 2015-08-19 DIAGNOSIS — R0600 Dyspnea, unspecified: Secondary | ICD-10-CM | POA: Diagnosis present

## 2015-08-19 DIAGNOSIS — Z880 Allergy status to penicillin: Secondary | ICD-10-CM | POA: Diagnosis not present

## 2015-08-19 LAB — CBC
HEMATOCRIT: 37.7 % — AB (ref 40.0–52.0)
HEMOGLOBIN: 12.5 g/dL — AB (ref 13.0–18.0)
MCH: 29.4 pg (ref 26.0–34.0)
MCHC: 33.3 g/dL (ref 32.0–36.0)
MCV: 88.3 fL (ref 80.0–100.0)
Platelets: 210 10*3/uL (ref 150–440)
RBC: 4.27 MIL/uL — AB (ref 4.40–5.90)
RDW: 15.9 % — ABNORMAL HIGH (ref 11.5–14.5)
WBC: 7.4 10*3/uL (ref 3.8–10.6)

## 2015-08-19 LAB — BLOOD GAS, ARTERIAL
ACID-BASE EXCESS: 6.5 mmol/L — AB (ref 0.0–3.0)
Allens test (pass/fail): POSITIVE — AB
BICARBONATE: 33.9 meq/L — AB (ref 21.0–28.0)
FIO2: 0.28
O2 Saturation: 86.2 %
PH ART: 7.36 (ref 7.350–7.450)
PO2 ART: 54 mmHg — AB (ref 83.0–108.0)
Patient temperature: 37
pCO2 arterial: 60 mmHg — ABNORMAL HIGH (ref 32.0–48.0)

## 2015-08-19 LAB — COMPREHENSIVE METABOLIC PANEL
ALBUMIN: 3.5 g/dL (ref 3.5–5.0)
ALK PHOS: 87 U/L (ref 38–126)
ALT: 24 U/L (ref 17–63)
ANION GAP: 7 (ref 5–15)
AST: 27 U/L (ref 15–41)
BILIRUBIN TOTAL: 0.4 mg/dL (ref 0.3–1.2)
BUN: 6 mg/dL (ref 6–20)
CALCIUM: 8.9 mg/dL (ref 8.9–10.3)
CO2: 34 mmol/L — ABNORMAL HIGH (ref 22–32)
CREATININE: 0.94 mg/dL (ref 0.61–1.24)
Chloride: 98 mmol/L — ABNORMAL LOW (ref 101–111)
GFR calc non Af Amer: >60 mL/min (ref >60)
GLUCOSE: 114 mg/dL — AB (ref 65–99)
Potassium: 3.7 mmol/L (ref 3.5–5.1)
Sodium: 139 mmol/L (ref 135–145)
TOTAL PROTEIN: 6.6 g/dL (ref 6.5–8.1)

## 2015-08-19 LAB — THEOPHYLLINE LEVEL: Theophylline Lvl: 5.3 — ABNORMAL LOW (ref 8.0–20.0)

## 2015-08-19 MED ORDER — HYDROCOD POLST-CPM POLST ER 10-8 MG/5ML PO SUER
5.0000 mL | Freq: Once | ORAL | Status: AC
  Administered 2015-08-19: 5 mL via ORAL
  Filled 2015-08-19: qty 5

## 2015-08-19 MED ORDER — IOHEXOL 300 MG/ML  SOLN
75.0000 mL | Freq: Once | INTRAMUSCULAR | Status: AC | PRN
  Administered 2015-08-19: 75 mL via INTRAVENOUS

## 2015-08-19 MED ORDER — OXYCODONE HCL 5 MG PO TABS
10.0000 mg | ORAL_TABLET | ORAL | Status: DC | PRN
  Administered 2015-08-19: 10 mg via ORAL
  Filled 2015-08-19: qty 2

## 2015-08-19 MED ORDER — DEXTROMETHORPHAN POLISTIREX ER 30 MG/5ML PO SUER
60.0000 mg | ORAL | Status: AC
  Administered 2015-08-19: 60 mg via ORAL
  Filled 2015-08-19: qty 10

## 2015-08-19 MED ORDER — OXYCODONE HCL ER 10 MG PO T12A
20.0000 mg | EXTENDED_RELEASE_TABLET | Freq: Two times a day (BID) | ORAL | Status: DC
  Administered 2015-08-19: 20 mg via ORAL
  Filled 2015-08-19: qty 2

## 2015-08-19 MED ORDER — SODIUM CHLORIDE 0.9 % IV BOLUS (SEPSIS)
1000.0000 mL | Freq: Once | INTRAVENOUS | Status: AC
  Administered 2015-08-19: 1000 mL via INTRAVENOUS

## 2015-08-19 MED ORDER — TRAZODONE HCL 100 MG PO TABS
100.0000 mg | ORAL_TABLET | ORAL | Status: AC
  Administered 2015-08-19: 100 mg via ORAL
  Filled 2015-08-19: qty 1

## 2015-08-19 MED ORDER — ALBUTEROL SULFATE (2.5 MG/3ML) 0.083% IN NEBU
7.5000 mg | INHALATION_SOLUTION | Freq: Once | RESPIRATORY_TRACT | Status: AC
  Administered 2015-08-19: 7.5 mg via RESPIRATORY_TRACT
  Filled 2015-08-19: qty 9

## 2015-08-19 MED ORDER — METHYLPREDNISOLONE SODIUM SUCC 125 MG IJ SOLR
80.0000 mg | Freq: Once | INTRAMUSCULAR | Status: AC
  Administered 2015-08-19: 80 mg via INTRAVENOUS
  Filled 2015-08-19: qty 2

## 2015-08-19 MED ORDER — CLONAZEPAM 0.5 MG PO TABS
1.0000 mg | ORAL_TABLET | ORAL | Status: AC
  Administered 2015-08-19: 1 mg via ORAL
  Filled 2015-08-19: qty 2

## 2015-08-19 MED ORDER — VANCOMYCIN HCL 10 G IV SOLR
1500.0000 mg | Freq: Once | INTRAVENOUS | Status: AC
  Administered 2015-08-19: 1500 mg via INTRAVENOUS
  Filled 2015-08-19: qty 1500

## 2015-08-19 MED ORDER — LEVOFLOXACIN IN D5W 750 MG/150ML IV SOLN
750.0000 mg | Freq: Once | INTRAVENOUS | Status: AC
  Administered 2015-08-19: 750 mg via INTRAVENOUS
  Filled 2015-08-19: qty 150

## 2015-08-19 MED ORDER — GABAPENTIN 400 MG PO CAPS
800.0000 mg | ORAL_CAPSULE | ORAL | Status: AC
  Administered 2015-08-19: 800 mg via ORAL
  Filled 2015-08-19: qty 2

## 2015-08-19 MED ORDER — DEXTROSE 5 % IV SOLN
2.0000 g | Freq: Once | INTRAVENOUS | Status: DC
  Filled 2015-08-19: qty 2

## 2015-08-19 MED ORDER — THEOPHYLLINE ER 400 MG PO TB24
400.0000 mg | ORAL_TABLET | ORAL | Status: AC
  Administered 2015-08-19: 400 mg via ORAL
  Filled 2015-08-19: qty 1

## 2015-08-19 NOTE — ED Notes (Signed)
Pt via ems from [facility]; states he was treated in December and again in January for pneumonia; spent many days at [facility]. Pt followed up with pulmonary MD today, who did cxr and told him that his xray looks just like it did in January, that his O2 sats were in 70's, and he needed to go to ED. Some concern for malignancy or unresolved pneumonia. Pt alert & oriented. Arrived on 4L O2, currently 92-95% on RA.

## 2015-08-19 NOTE — ED Notes (Signed)
Pt presents with reduced O2 sats per his earlier visit today with pulmonary MD. His cxr shows that he still has a "spot" in his lungs. Pt has inspiratory and expiratory wheezes bilaterally. Pt was on 4L O2 upon arrival, went to 94% on room air, and is currently at 92%. Pt alert & oriented with warm, dry skin. NAD noted.

## 2015-08-19 NOTE — Discharge Instructions (Signed)
You were seen today due to concern that your chest x-ray had not improved in the past few months. We performed a CT scan of your chest which shows that the area of concern is a chronic finding which is likely to be there forever. Please follow-up with your pulmonology clinic for further management and continue your home medications. CT report is as follows:  FINDINGS: The lungs are well aerated bilaterally and again demonstrate diffuse emphysematous changes similar to that seen on the prior exam. The left lung is clear without focal infiltrate. Some scarring is again noted in the right apex stable from the prior exams . Scarring is also noted in the right lung base also stable from previous exams. The area of somewhat nodular density seen on the most recent exam has cleared in the interval. A residual calcification is noted consistent with calcified granuloma. These changes were likely postinflammatory in nature. Pleural thickening is noted posteriorly also stable from the prior exam.  The thoracic inlet is within normal limits. The thoracic aorta and its branches are within normal limits. No sizable pulmonary emboli are seen although timing was not performed for pulmonary embolus evaluation. Multiple calcified lymph nodes are noted within the right hilum and subcarinal region consistent with prior granulomatous disease.  The visualized upper abdomen is within normal limits. Scattered splenic calcified granulomas are noted. No acute bony abnormality is seen. Degenerative change of the thoracic spine is noted.  IMPRESSION: Resolution of somewhat nodular appearing area on the prior exam. This is consistent with resolution of previous inflammatory abnormality.  Scarring in the right apex as well as right lower lobe stable from the prior study.  Changes of prior granulomatous disease.  Emphysematous changes.  No acute abnormality is noted.   Chronic Respiratory  Failure Respiratory failure is when your lungs are not working well and your breathing (respiratory) system fails. When respiratory failure occurs, it is difficult for your lungs to get enough oxygen or get rid of carbon dioxide or both. Respiratory failure can be life threatening.  Respiratory failure can be acute or chronic. Acute respiratory failure is sudden, severe, and requires emergency medical treatment. Chronic respiratory failure is less severe, happens over time, and requires ongoing treatment.  CAUSES  Any problem affecting the heart or lungs can cause respiratory failure. Some of these causes may be:  Chronic bronchitis and emphysema (COPD).  Blood clot going to the lung (pulmonary embolism).  Having water in the lungs caused by heart failure, lung injury, or infection (pulmonary edema).  Collapsed lung (pneumothorax).  Pneumonia.  Pulmonary fibrosis.  Obesity.  Asthma.  Heart failure.  Any type of trauma to the chest that can make breathing difficult.  Nerve or muscle diseases making chest movements difficult. SYMPTOMS  Signs and symptoms of chronic respiratory failure include:  Shortness of breath (dyspnea) with or without activity.  Rapid, fast breathing (tachypnea).  Wheezing.  Fast heart rate.  Bluish color to the fingernail or toenail beds.  Confusion or drowsiness or both. DIAGNOSIS  Initial diagnosis requires a thorough history and a physical exam by your health care provider. Additional tests may include:  Chest X-ray.  CT scan of your lungs.  Ultrasound to check for blood clots.  Blood tests, such as an arterial blood gas test (ABG). This is a blood test that looks at the oxygen and carbon dioxide levels in your arterial blood.  Your vital signs will be taken. This includes your respiratory rate (how many times a minute you  are breathing), oxygen saturation (this measures the oxygen level in your blood), heart rate, and blood pressure. These  numbers help your health care provider determine the next steps.  Electrocardiogram. TREATMENT  Treatment of chronic respiratory failure depends on the cause of the respiratory failure. Treatment can include the following:  Oxygen. Oxygen can be delivered through the following:  Nasal cannula. This is small tubing that goes in your nose to give you oxygen.  Face mask. A face mask covers your nose and mouth to give you oxygen.  Medicine. Different medicines can be given to help with breathing. These can include:  Nebulizers. Nebulizers deliver medicines to open the air passages (bronchodilators). These medicines help to open or relax the airways in the lungs so you can breathe better. They can also help loosen mucus from your lungs.  Diuretics. Diuretic medicines can help you breathe better by getting rid of extra fluid in your body.  Steroids. Steroid medicines can help decrease inflammation in your lungs.  Chest tube. If you have a collapsed lung (pneumothorax), a chest tube is placed to help reinflate the lung.  Noninvasive positive pressure ventilation (NPPV). This is a tight-fitting mask that goes over your nose and mouth. The mask has tubing that is attached to a machine. The machine blows air into the tubing, which helps to keep the tiny air sacs (alveoli) in your lungs open. This machine allows you to breathe on your own.  Ventilator. A ventilator is a breathing machine. When on a ventilator, a breathing tube is put into the lungs. A ventilator is used when you can no longer breathe well enough on your own. You may have low oxygen levels or high carbon dioxide (CO2) levels in your blood. When you are on a ventilator, sedation and pain medicines are given to make you sleep so your lungs can heal. HOME CARE INSTRUCTIONS  Follow your health care provider's directions about medicines and respiratory therapy.  Quit smoking if you smoke. SEEK MEDICAL CARE IF:  You have increasing  shortness of breath and are less functional than you have been.  You have increased sputum, wheezing, coughing, or loss of energy.  You are on oxygen and are requiring more. SEEK IMMEDIATE MEDICAL CARE IF:  Your shortness of breath is significantly worse.  You are unable to say more than a few words without having to catch your breath.  You are much less functional. MAKE SURE YOU:  Understand these instructions.  Will watch your condition.  Will get help right away if you are not doing well or get worse.   This information is not intended to replace advice given to you by your health care provider. Make sure you discuss any questions you have with your health care provider.   Document Released: 06/13/2005 Document Revised: 03/04/2015 Document Reviewed: 04/11/2013 Elsevier Interactive Patient Education [employer].

## 2015-08-19 NOTE — ED Notes (Signed)
Provided patient with food tray and grape juice to drink per patient request.
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
## 2015-08-20 NOTE — ED Notes (Signed)
Patient with no complaints at this time. Respirations even and unlabored. Skin warm/dry. Discharge instructions reviewed with patient at this time. Patient given opportunity to voice concerns/ask questions. IV removed per policy and band-aid applied to site. Patient discharged at this time and left Emergency Department, via ambulance.    

## 2015-09-17 ENCOUNTER — Encounter (HOSPITAL_COMMUNITY): Payer: Self-pay | Admitting: Emergency Medicine

## 2015-09-17 ENCOUNTER — Emergency Department (HOSPITAL_COMMUNITY)
Admission: EM | Admit: 2015-09-17 | Discharge: 2015-09-17 | Disposition: A | Discharge status: Home / Self Care | Payer: Medicaid Other | Attending: Emergency Medicine | Admitting: Emergency Medicine

## 2015-09-17 ENCOUNTER — Emergency Department (HOSPITAL_COMMUNITY): Payer: Medicaid Other

## 2015-09-17 DIAGNOSIS — J441 Chronic obstructive pulmonary disease with (acute) exacerbation: Secondary | ICD-10-CM | POA: Diagnosis not present

## 2015-09-17 DIAGNOSIS — Z720 Tobacco use: Secondary | ICD-10-CM

## 2015-09-17 DIAGNOSIS — I2510 Atherosclerotic heart disease of native coronary artery without angina pectoris: Secondary | ICD-10-CM | POA: Diagnosis not present

## 2015-09-17 DIAGNOSIS — J45909 Unspecified asthma, uncomplicated: Secondary | ICD-10-CM | POA: Insufficient documentation

## 2015-09-17 DIAGNOSIS — F17210 Nicotine dependence, cigarettes, uncomplicated: Secondary | ICD-10-CM | POA: Diagnosis not present

## 2015-09-17 DIAGNOSIS — F329 Major depressive disorder, single episode, unspecified: Secondary | ICD-10-CM | POA: Diagnosis not present

## 2015-09-17 DIAGNOSIS — J189 Pneumonia, unspecified organism: Secondary | ICD-10-CM | POA: Diagnosis present

## 2015-09-17 DIAGNOSIS — I10 Essential (primary) hypertension: Secondary | ICD-10-CM | POA: Insufficient documentation

## 2015-09-17 LAB — COMPREHENSIVE METABOLIC PANEL
ALBUMIN: 3.9 g/dL (ref 3.5–5.0)
ALK PHOS: 87 U/L (ref 38–126)
ALT: 13 U/L — ABNORMAL LOW (ref 17–63)
AST: 20 U/L (ref 15–41)
Anion gap: 6 (ref 5–15)
BILIRUBIN TOTAL: 0.4 mg/dL (ref 0.3–1.2)
BUN: 8 mg/dL (ref 6–20)
CHLORIDE: 97 mmol/L — AB (ref 101–111)
CO2: 32 mmol/L (ref 22–32)
CREATININE: 0.89 mg/dL (ref 0.61–1.24)
Calcium: 8.6 mg/dL — ABNORMAL LOW (ref 8.9–10.3)
GFR calc Af Amer: >60 mL/min (ref >60)
GFR calc non Af Amer: >60 mL/min (ref >60)
Glucose, Bld: 97 mg/dL (ref 65–99)
Potassium: 4.4 mmol/L (ref 3.5–5.1)
Sodium: 135 mmol/L (ref 135–145)
Total Protein: 7.3 g/dL (ref 6.5–8.1)

## 2015-09-17 LAB — CBC WITH DIFFERENTIAL/PLATELET
BASOS ABS: 0.1 10*3/uL (ref 0.0–0.1)
Basophils Relative: 1 %
EOS PCT: 2 %
Eosinophils Absolute: 0.1 10*3/uL (ref 0.0–0.7)
HEMATOCRIT: 42.2 % (ref 39.0–52.0)
HEMOGLOBIN: 13.5 g/dL (ref 13.0–17.0)
LYMPHS PCT: 27 %
Lymphs Abs: 2.3 10*3/uL (ref 0.7–4.0)
MCH: 29.2 pg (ref 26.0–34.0)
MCHC: 32 g/dL (ref 30.0–36.0)
MCV: 91.3 fL (ref 78.0–100.0)
Monocytes Absolute: 0.5 10*3/uL (ref 0.1–1.0)
Monocytes Relative: 6 %
Neutro Abs: 5.5 10*3/uL (ref 1.7–7.7)
Neutrophils Relative %: 64 %
Platelets: 257 10*3/uL (ref 150–400)
RBC: 4.62 MIL/uL (ref 4.22–5.81)
RDW: 15.1 % (ref 11.5–15.5)
WBC: 8.5 10*3/uL (ref 4.0–10.5)

## 2015-09-17 MED ORDER — DOXYCYCLINE HYCLATE 100 MG PO CAPS: 100.0000 mg | ORAL_CAPSULE | Freq: Two times a day (BID) | ORAL | Status: DC

## 2015-09-17 MED ORDER — DOXYCYCLINE HYCLATE 100 MG PO TABS
100.0000 mg | ORAL_TABLET | Freq: Once | ORAL | Status: AC
  Administered 2015-09-17: 100 mg via ORAL
  Filled 2015-09-17: qty 1

## 2015-09-17 NOTE — Discharge Instructions (Signed)
Start the antibiotic prescription in the morning Use your nebulizer every 3-4 hours as needed for cough or trouble breathing. See your doctor for checkup in 3 days. Try to stop smoking.   Chronic Obstructive Pulmonary Disease Exacerbation Chronic obstructive pulmonary disease (COPD) is a common lung condition in which airflow from the lungs is limited. COPD is a general term that can be used to describe many different lung problems that limit airflow, including chronic bronchitis and emphysema. COPD exacerbations are episodes when breathing symptoms become much worse and require extra treatment. Without treatment, COPD exacerbations can be life threatening, and frequent COPD exacerbations can cause further damage to your lungs. CAUSES  Respiratory infections.  Exposure to smoke.  Exposure to air pollution, chemical fumes, or dust. Sometimes there is no apparent cause or trigger. RISK FACTORS  Smoking cigarettes.  Older age.  Frequent prior COPD exacerbations. SIGNS AND SYMPTOMS  Increased coughing.  Increased thick spit (sputum) production.  Increased wheezing.  Increased shortness of breath.  Rapid breathing.  Chest tightness. DIAGNOSIS Your medical history, a physical exam, and tests will help your health care provider make a diagnosis. Tests may include:  A chest X-ray.  Basic lab tests.  Sputum testing.  An arterial blood gas test. TREATMENT Depending on the severity of your COPD exacerbation, you may need to be admitted to a hospital for treatment. Some of the treatments commonly used to treat COPD exacerbations are:   Antibiotic medicines.  Bronchodilators. These are drugs that expand the air passages. They may be given with an inhaler or nebulizer. Spacer devices may be needed to help improve drug delivery.  Corticosteroid medicines.  Supplemental oxygen therapy.  Airway clearing techniques, such as noninvasive ventilation (NIV) and positive expiratory  pressure (PEP). These provide respiratory support through a mask or other noninvasive device. HOME CARE INSTRUCTIONS  Do not smoke. Quitting smoking is very important to prevent COPD from getting worse and exacerbations from happening as often.  Avoid exposure to all substances that irritate the airway, especially to tobacco smoke.  If you were prescribed an antibiotic medicine, finish it all even if you start to feel better.  Take all medicines as directed by your health care provider.It is important to use correct technique with inhaled medicines.  Drink enough fluids to keep your urine clear or pale yellow (unless you have a medical condition that requires fluid restriction).  Use a cool mist vaporizer. This makes it easier to clear your chest when you cough.  If you have a home nebulizer and oxygen, continue to use them as directed.  Maintain all necessary vaccinations to prevent infections.  Exercise regularly.  Eat a healthy diet.  Keep all follow-up appointments as directed by your health care provider. SEEK IMMEDIATE MEDICAL CARE IF:  You have worsening shortness of breath.  You have trouble talking.  You have severe chest pain.  You have blood in your sputum.  You have a fever.  You have weakness, vomit repeatedly, or faint.  You feel confused.  You continue to get worse. MAKE SURE YOU:  Understand these instructions.  Will watch your condition.  Will get help right away if you are not doing well or get worse.   This information is not intended to replace advice given to you by your health care provider. Make sure you discuss any questions you have with your health care provider.   Document Released: 04/10/2007 Document Revised: 07/04/2014 Document Reviewed: 02/15/2013 Elsevier Interactive Patient Education [employer].  Smoking Hazards Smoking cigarettes is extremely bad for your health. Tobacco smoke has over 200 known poisons in it. It  contains the poisonous gases nitrogen oxide and carbon monoxide. There are over 60 chemicals in tobacco smoke that cause cancer. Some of the chemicals found in cigarette smoke include:   Cyanide.   Benzene.   Formaldehyde.   Methanol (wood alcohol).   Acetylene (fuel used in welding torches).   Ammonia.  Even smoking lightly shortens your life expectancy by several years. You can greatly reduce the risk of medical problems for you and your family by stopping now. Smoking is the most preventable cause of death and disease in our society. Within days of quitting smoking, your circulation improves, you decrease the risk of having a heart attack, and your lung capacity improves. There may be some increased phlegm in the first few days after quitting, and it may take months for your lungs to clear up completely. Quitting for 10 years reduces your risk of developing lung cancer to almost that of a nonsmoker.  WHAT ARE THE RISKS OF SMOKING? Cigarette smokers have an increased risk of many serious medical problems, including:  Lung cancer.   Lung disease (such as pneumonia, bronchitis, and emphysema).   Heart attack and chest pain due to the heart not getting enough oxygen (angina).   Heart disease and peripheral blood vessel disease.   Hypertension.   Stroke.   Oral cancer (cancer of the lip, mouth, or voice box).   Bladder cancer.   Pancreatic cancer.   Cervical cancer.   Pregnancy complications, including premature birth.   Stillbirths and smaller newborn babies, birth defects, and genetic damage to sperm.   Early menopause.   Lower estrogen level for women.   Infertility.   Facial wrinkles.   Blindness.   Increased risk of broken bones (fractures).   Senile dementia.   Stomach ulcers and internal bleeding.   Delayed wound healing and increased risk of complications during surgery. Because of secondhand smoke exposure, children of smokers  have an increased risk of the following:   Sudden infant death syndrome (SIDS).   Respiratory infections.   Lung cancer.   Heart disease.   Ear infections.  WHY IS SMOKING ADDICTIVE? Nicotine is the chemical agent in tobacco that is capable of causing addiction or dependence. When you smoke and inhale, nicotine is absorbed rapidly into the bloodstream through your lungs. Both inhaled and noninhaled nicotine may be addictive.  WHAT ARE THE BENEFITS OF QUITTING?  There are many health benefits to quitting smoking. Some are:   The likelihood of developing cancer and heart disease decreases. Health improvements are seen almost immediately.   Blood pressure, pulse rate, and breathing patterns start returning to normal soon after quitting.   People who quit may see an improvement in their overall quality of life.  HOW DO YOU QUIT SMOKING? Smoking is an addiction with both physical and psychological effects, and longtime habits can be hard to change. Your health care provider can recommend:  Programs and community resources, which may include group support, education, or therapy.  Replacement products, such as patches, gum, and nasal sprays. Use these products only as directed. Do not replace cigarette smoking with electronic cigarettes (commonly called e-cigarettes). The safety of e-cigarettes is unknown, and some may contain harmful chemicals. FOR MORE INFORMATION  American Lung Association: www.lung.org  American Cancer Society: www.cancer.org   This information is not intended to replace advice given to you by your health care  provider. Make sure you discuss any questions you have with your health care provider.   Document Released: 07/21/2004 Document Revised: 04/03/2013 Document Reviewed: 12/03/2012 Elsevier Interactive Patient Education 2016 [employer].  Smoking Cessation, Tips for Success If you are ready to quit smoking, congratulations! You have chosen to help  yourself be healthier. Cigarettes bring nicotine, tar, carbon monoxide, and other irritants into your body. Your lungs, heart, and blood vessels will be able to work better without these poisons. There are many different ways to quit smoking. Nicotine gum, nicotine patches, a nicotine inhaler, or nicotine nasal spray can help with physical craving. Hypnosis, support groups, and medicines help break the habit of smoking. WHAT THINGS CAN I DO TO MAKE QUITTING EASIER?  Here are some tips to help you quit for good:  Pick a date when you will quit smoking completely. Tell all of your friends and family about your plan to quit on that date.  Do not try to slowly cut down on the number of cigarettes you are smoking. Pick a quit date and quit smoking completely starting on that day.  Throw away all cigarettes.   Clean and remove all ashtrays from your home, work, and car.  On a card, write down your reasons for quitting. Carry the card with you and read it when you get the urge to smoke.  Cleanse your body of nicotine. Drink enough water and fluids to keep your urine clear or pale yellow. Do this after quitting to flush the nicotine from your body.  Learn to predict your moods. Do not let a bad situation be your excuse to have a cigarette. Some situations in your life might tempt you into wanting a cigarette.  Never have "just one" cigarette. It leads to wanting another and another. Remind yourself of your decision to quit.  Change habits associated with smoking. If you smoked while driving or when feeling stressed, try other activities to replace smoking. Stand up when drinking your coffee. Brush your teeth after eating. Sit in a different chair when you read the paper. Avoid alcohol while trying to quit, and try to drink fewer caffeinated beverages. Alcohol and caffeine may urge you to smoke.  Avoid foods and drinks that can trigger a desire to smoke, such as sugary or spicy foods and  alcohol.  Ask people who smoke not to smoke around you.  Have something planned to do right after eating or having a cup of coffee. For example, plan to take a walk or exercise.  Try a relaxation exercise to calm you down and decrease your stress. Remember, you may be tense and nervous for the first 2 weeks after you quit, but this will pass.  Find new activities to keep your hands busy. Play with a pen, coin, or rubber band. Doodle or draw things on paper.  Brush your teeth right after eating. This will help cut down on the craving for the taste of tobacco after meals. You can also try mouthwash.   Use oral substitutes in place of cigarettes. Try using lemon drops, carrots, cinnamon sticks, or chewing gum. Keep them handy so they are available when you have the urge to smoke.  When you have the urge to smoke, try deep breathing.  Designate your home as a nonsmoking area.  If you are a heavy smoker, ask your health care provider about a prescription for nicotine chewing gum. It can ease your withdrawal from nicotine.  Reward yourself. Set aside the cigarette money  you save and buy yourself something nice.  Look for support from others. Join a support group or smoking cessation program. Ask someone at home or at work to help you with your plan to quit smoking.  Always ask yourself, "Do I need this cigarette or is this just a reflex?" Tell yourself, "Today, I choose not to smoke," or "I do not want to smoke." You are reminding yourself of your decision to quit.  Do not replace cigarette smoking with electronic cigarettes (commonly called e-cigarettes). The safety of e-cigarettes is unknown, and some may contain harmful chemicals.  If you relapse, do not give up! Plan ahead and think about what you will do the next time you get the urge to smoke. HOW WILL I FEEL WHEN I QUIT SMOKING? You may have symptoms of withdrawal because your body is used to nicotine (the addictive substance in  cigarettes). You may crave cigarettes, be irritable, feel very hungry, cough often, get headaches, or have difficulty concentrating. The withdrawal symptoms are only temporary. They are strongest when you first quit but will go away within 10-14 days. When withdrawal symptoms occur, stay in control. Think about your reasons for quitting. Remind yourself that these are signs that your body is healing and getting used to being without cigarettes. Remember that withdrawal symptoms are easier to treat than the major diseases that smoking can cause.  Even after the withdrawal is over, expect periodic urges to smoke. However, these cravings are generally short lived and will go away whether you smoke or not. Do not smoke! WHAT RESOURCES ARE AVAILABLE TO HELP ME QUIT SMOKING? Your health care provider can direct you to community resources or hospitals for support, which may include:  Group support.  Education.  Hypnosis.  Therapy.   This information is not intended to replace advice given to you by your health care provider. Make sure you discuss any questions you have with your health care provider.   Document Released: 03/11/2004 Document Revised: 07/04/2014 Document Reviewed: 11/29/2012 Elsevier Interactive Patient Education [employer].

## 2015-09-17 NOTE — ED Notes (Signed)
EMS called at this time to set up transport back to [facility] in [place].

## 2015-09-17 NOTE — ED Notes (Signed)
Meal tray provided to patient at this time.
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
## 2015-09-17 NOTE — ED Notes (Signed)
PT from [facility] for dx of pneumonia unchanged in the past month per mobile chest xray. PT on oxygen 2L per n/c prn and reported having the flu about a month ago and worsening pneumonia and has been unresponsive to treatments on po antibiotics. PT denies any SOB or chest pain at this time.

## 2015-09-17 NOTE — ED Notes (Signed)
Report given to Leandro Reasoner, LPN at [facility] at this time.

## 2015-09-17 NOTE — ED Notes (Signed)
Patient verbalizes understanding of discharge instructions, prescription medications, home care and follow up care. Patient out of department VIA [facility] at this time.

## 2015-12-30 ENCOUNTER — Encounter: Payer: Self-pay | Admitting: Emergency Medicine

## 2015-12-30 ENCOUNTER — Emergency Department
Admission: EM | Admit: 2015-12-30 | Discharge: 2015-12-30 | Disposition: A | Discharge status: Home / Self Care | Payer: Medicaid Other | Attending: Emergency Medicine | Admitting: Emergency Medicine

## 2015-12-30 DIAGNOSIS — Z7951 Long term (current) use of inhaled steroids: Secondary | ICD-10-CM | POA: Insufficient documentation

## 2015-12-30 DIAGNOSIS — Z79899 Other long term (current) drug therapy: Secondary | ICD-10-CM | POA: Insufficient documentation

## 2015-12-30 DIAGNOSIS — F17210 Nicotine dependence, cigarettes, uncomplicated: Secondary | ICD-10-CM | POA: Insufficient documentation

## 2015-12-30 DIAGNOSIS — I10 Essential (primary) hypertension: Secondary | ICD-10-CM | POA: Diagnosis not present

## 2015-12-30 DIAGNOSIS — F329 Major depressive disorder, single episode, unspecified: Secondary | ICD-10-CM | POA: Diagnosis not present

## 2015-12-30 DIAGNOSIS — Z760 Encounter for issue of repeat prescription: Secondary | ICD-10-CM | POA: Insufficient documentation

## 2015-12-30 DIAGNOSIS — J45909 Unspecified asthma, uncomplicated: Secondary | ICD-10-CM | POA: Insufficient documentation

## 2015-12-30 DIAGNOSIS — J449 Chronic obstructive pulmonary disease, unspecified: Secondary | ICD-10-CM | POA: Diagnosis not present

## 2015-12-30 NOTE — ED Notes (Signed)
[employer] called to provide transportation back to [facility].  Pt moved into lobby to wait at this time.

## 2015-12-30 NOTE — ED Notes (Signed)
Pt requesting so speak with someone in management and who a doctor. Nurse will have someone come talk with him

## 2015-12-30 NOTE — ED Provider Notes (Signed)
I have spoke with the patient about our inability to refill narcotics. Patient is calm to the ER essentially for a disagreement that he has with his prescription medications. He is currently from a group home and is unhappy with their prescribing techniques. I advised we do not manage this type of thing in the ER. Advised him he must speak with his doctor, primary care doctor or nurse.  Medical screening examination/treatment/procedure(s) were performed by non-physician practitioner and as supervising physician I was immediately available for consultation/collaboration.    Earleen Newport, MD 12/30/15 [phone]

## 2015-12-30 NOTE — Discharge Instructions (Signed)
Chronic narcotic medication will not be refilled in the ED. Prescribing doctor at the care center will assist you with your medication issues.
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
## 2015-12-30 NOTE — ED Notes (Signed)
Pt in via EMS from [place]; pt was transferred there last Thursday from [facility], currently awaiting placement in [place].  Pt with chronic back pain due to previous back surgery in 2011.  Pt reports that the group home has stopped giving him his oxycodone 10mg  as of this past Sunday and refuses to have the script refilled.  Pt is here to have oxycodone refilled.  Pt A/Ox4, vitals WDL, no immediate distress at this time.

## 2016-01-08 ENCOUNTER — Emergency Department
Admission: EM | Admit: 2016-01-08 | Discharge: 2016-01-08 | Disposition: A | Discharge status: Home / Self Care | Payer: Medicaid Other | Attending: Emergency Medicine | Admitting: Emergency Medicine

## 2016-01-08 ENCOUNTER — Encounter: Payer: Self-pay | Admitting: Emergency Medicine

## 2016-01-08 DIAGNOSIS — G8921 Chronic pain due to trauma: Secondary | ICD-10-CM

## 2016-01-08 DIAGNOSIS — Z7951 Long term (current) use of inhaled steroids: Secondary | ICD-10-CM | POA: Diagnosis not present

## 2016-01-08 DIAGNOSIS — J45909 Unspecified asthma, uncomplicated: Secondary | ICD-10-CM | POA: Insufficient documentation

## 2016-01-08 DIAGNOSIS — J449 Chronic obstructive pulmonary disease, unspecified: Secondary | ICD-10-CM | POA: Diagnosis not present

## 2016-01-08 DIAGNOSIS — G8929 Other chronic pain: Secondary | ICD-10-CM | POA: Diagnosis not present

## 2016-01-08 DIAGNOSIS — I10 Essential (primary) hypertension: Secondary | ICD-10-CM | POA: Insufficient documentation

## 2016-01-08 DIAGNOSIS — F17210 Nicotine dependence, cigarettes, uncomplicated: Secondary | ICD-10-CM | POA: Insufficient documentation

## 2016-01-08 DIAGNOSIS — F1120 Opioid dependence, uncomplicated: Secondary | ICD-10-CM | POA: Insufficient documentation

## 2016-01-08 DIAGNOSIS — F329 Major depressive disorder, single episode, unspecified: Secondary | ICD-10-CM | POA: Diagnosis not present

## 2016-01-08 DIAGNOSIS — F99 Mental disorder, not otherwise specified: Secondary | ICD-10-CM | POA: Diagnosis not present

## 2016-01-08 DIAGNOSIS — Z79899 Other long term (current) drug therapy: Secondary | ICD-10-CM | POA: Insufficient documentation

## 2016-01-08 DIAGNOSIS — I2510 Atherosclerotic heart disease of native coronary artery without angina pectoris: Secondary | ICD-10-CM | POA: Insufficient documentation

## 2016-01-08 DIAGNOSIS — R52 Pain, unspecified: Secondary | ICD-10-CM | POA: Diagnosis present

## 2016-01-08 MED ORDER — DIAZEPAM 5 MG PO TABS: 5.0000 mg | ORAL_TABLET | Freq: Four times a day (QID) | ORAL | Status: AC | PRN

## 2016-01-08 MED ORDER — MELOXICAM 15 MG PO TABS: 15.0000 mg | ORAL_TABLET | Freq: Every day | ORAL | Status: DC

## 2016-01-08 NOTE — ED Notes (Signed)
Report called to Isaias Sakai RN at [facility], who requested that we fax the discharge papers to her due to the patient having previously thrown some of the papers away. [facility] stated that they will call [employer] [facility] to transport the patient back and will instruct the driver not to stop anywhere between here and [facility]. [facility]in [facility]us is aware of the medicines prescribed.

## 2016-01-08 NOTE — Discharge Instructions (Signed)
Chronic Pain Chronic pain can be defined as pain that is off and on and lasts for 3-6 months or longer. Many things cause chronic pain, which can make it difficult to make a diagnosis. There are many treatment options available for chronic pain. However, finding a treatment that works well for you may require trying various approaches until the right one is found. Many people benefit from a combination of two or more types of treatment to control their pain. SYMPTOMS  Chronic pain can occur anywhere in the body and can range from mild to very severe. Some types of chronic pain include:  Headache.  Low back pain.  Cancer pain.  Arthritis pain.  Neurogenic pain. This is pain resulting from damage to nerves. People with chronic pain may also have other symptoms such as:  Depression.  Anger.  Insomnia.  Anxiety. DIAGNOSIS  Your health care provider will help diagnose your condition over time. In many cases, the initial focus will be on excluding possible conditions that could be causing the pain. Depending on your symptoms, your health care provider may order tests to diagnose your condition. Some of these tests may include:   Blood tests.   CT scan.   MRI.   X-rays.   Ultrasounds.   Nerve conduction studies.  You may need to see a specialist.  TREATMENT  Finding treatment that works well may take time. You may be referred to a pain specialist. He or she may prescribe medicine or therapies, such as:   Mindful meditation or yoga.  Shots (injections) of numbing or pain-relieving medicines into the spine or area of pain.  Local electrical stimulation.  Acupuncture.   Massage therapy.   Aroma, color, light, or sound therapy.   Biofeedback.   Working with a physical therapist to keep from getting stiff.   Regular, gentle exercise.   Cognitive or behavioral therapy.   Group support.  Sometimes, surgery may be recommended.  HOME CARE INSTRUCTIONS    Take all medicines as directed by your health care provider.   Lessen stress in your life by relaxing and doing things such as listening to calming music.   Exercise or be active as directed by your health care provider.   Eat a healthy diet and include things such as vegetables, fruits, fish, and lean meats in your diet.   Keep all follow-up appointments with your health care provider.   Attend a support group with others suffering from chronic pain. SEEK MEDICAL CARE IF:   Your pain gets worse.   You develop a new pain that was not there before.   You cannot tolerate medicines given to you by your health care provider.   You have new symptoms since your last visit with your health care provider.  SEEK IMMEDIATE MEDICAL CARE IF:   You feel weak.   You have decreased sensation or numbness.   You lose control of bowel or bladder function.   Your pain suddenly gets much worse.   You develop shaking.  You develop chills.  You develop confusion.  You develop chest pain.  You develop shortness of breath.  MAKE SURE YOU:  Understand these instructions.    Opioid Withdrawal Opioids are a group of narcotic drugs. They include the street drug heroin. They also include pain medicines, such as morphine, hydrocodone, oxycodone, and fentanyl. Opioid withdrawal is a group of characteristic physical and mental signs and symptoms. It typically occurs if you have been using opioids daily for several weeks  or longer and stop using or rapidly decrease use. Opioid withdrawal can also occur if you have used opioids daily for a long time and are given a medicine to block the effect.  SIGNS AND SYMPTOMS Opioid withdrawal includes three or more of the following symptoms:   Depressed, anxious, or irritable mood.  Nausea or vomiting.  Muscle aches or spasms.   Watery eyes.   Runny nose.  Dilated pupils, sweating, or hairs standing on end.  Diarrhea or  intestinal cramping.  Yawning.   Fever.  Increased blood pressure.  Fast pulse.  Restlessness or trouble sleeping. These signs and symptoms occur within several hours of stopping or reducing short-acting opioids, such as heroin. They can occur within 3 days of stopping or reducing long-acting opioids, such as methadone. Withdrawal begins within minutes of receiving a drug that blocks the effects of opioids, such as naltrexone or naloxone. DIAGNOSIS  Opioid use disorder is diagnosed by your health care provider. You will be asked about your symptoms, drug and alcohol use, medical history, and use of medicines. A physical exam may be done. Lab tests may be ordered. Your health care provider may have you see a mental health professional.  TREATMENT  The treatment for opioid withdrawal is usually provided by medical doctors with special training in substance use disorders (addiction specialists). The following medicines may be included in treatment:  Opioids given in place of the abused opioid. They turn on opioid receptors in the brain and lessen or prevent withdrawal symptoms. They are gradually decreased (opioid substitution and taper).  Non-opioids that can lessen certain opioid withdrawal symptoms. They may be used alone or with opioid substitution and taper. Successful long-term recovery usually requires medicine, counseling, and group support. HOME CARE INSTRUCTIONS   Take medicines only as directed by your health care provider.  Check with your health care provider before starting new medicines.  Keep all follow-up visits as directed by your health care provider. SEEK MEDICAL CARE IF:  You are not able to take your medicines as directed.  Your symptoms get worse.  You relapse. SEEK IMMEDIATE MEDICAL CARE IF:  You have serious thoughts about hurting yourself or others.  You have a seizure.  You lose consciousness.   This information is not intended to replace advice  given to you by your health care provider. Make sure you discuss any questions you have with your health care provider.   Document Released: 06/16/2003 Document Revised: 07/04/2014 Document Reviewed: 06/26/2013 Elsevier Interactive Patient Education [employer].    Will get help right away if you are not doing well or get worse.   This information is not intended to replace advice given to you by your health care provider. Make sure you discuss any questions you have with your health care provider.   Document Released: 03/05/2002 Document Revised: 02/13/2013 Document Reviewed: 12/07/2012 Elsevier Interactive Patient Education [employer].
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
## 2016-01-08 NOTE — ED Notes (Signed)
Patient states that he is being seen for chronic pain in his back and neck

## 2016-07-28 ENCOUNTER — Emergency Department: Payer: Medicaid Other

## 2016-07-28 ENCOUNTER — Encounter: Payer: Self-pay | Admitting: *Deleted

## 2016-07-28 ENCOUNTER — Observation Stay
Admission: EM | Admit: 2016-07-28 | Discharge: 2016-08-01 | Disposition: A | Discharge status: Home / Self Care | Payer: Medicaid Other | Attending: Internal Medicine | Admitting: Internal Medicine

## 2016-07-28 DIAGNOSIS — Z7951 Long term (current) use of inhaled steroids: Secondary | ICD-10-CM | POA: Diagnosis not present

## 2016-07-28 DIAGNOSIS — G8929 Other chronic pain: Secondary | ICD-10-CM | POA: Diagnosis not present

## 2016-07-28 DIAGNOSIS — M545 Low back pain: Secondary | ICD-10-CM | POA: Insufficient documentation

## 2016-07-28 DIAGNOSIS — I2510 Atherosclerotic heart disease of native coronary artery without angina pectoris: Secondary | ICD-10-CM | POA: Diagnosis not present

## 2016-07-28 DIAGNOSIS — R6889 Other general symptoms and signs: Secondary | ICD-10-CM

## 2016-07-28 DIAGNOSIS — Z87820 Personal history of traumatic brain injury: Secondary | ICD-10-CM | POA: Diagnosis not present

## 2016-07-28 DIAGNOSIS — F17210 Nicotine dependence, cigarettes, uncomplicated: Secondary | ICD-10-CM | POA: Insufficient documentation

## 2016-07-28 DIAGNOSIS — Z9981 Dependence on supplemental oxygen: Secondary | ICD-10-CM | POA: Insufficient documentation

## 2016-07-28 DIAGNOSIS — G629 Polyneuropathy, unspecified: Secondary | ICD-10-CM | POA: Diagnosis not present

## 2016-07-28 DIAGNOSIS — I10 Essential (primary) hypertension: Secondary | ICD-10-CM | POA: Insufficient documentation

## 2016-07-28 DIAGNOSIS — Z880 Allergy status to penicillin: Secondary | ICD-10-CM | POA: Diagnosis not present

## 2016-07-28 DIAGNOSIS — K219 Gastro-esophageal reflux disease without esophagitis: Secondary | ICD-10-CM | POA: Insufficient documentation

## 2016-07-28 DIAGNOSIS — J441 Chronic obstructive pulmonary disease with (acute) exacerbation: Principal | ICD-10-CM | POA: Diagnosis present

## 2016-07-28 DIAGNOSIS — Z79899 Other long term (current) drug therapy: Secondary | ICD-10-CM | POA: Diagnosis not present

## 2016-07-28 DIAGNOSIS — R262 Difficulty in walking, not elsewhere classified: Secondary | ICD-10-CM

## 2016-07-28 DIAGNOSIS — F332 Major depressive disorder, recurrent severe without psychotic features: Secondary | ICD-10-CM | POA: Diagnosis not present

## 2016-07-28 LAB — BASIC METABOLIC PANEL
ANION GAP: 13 (ref 5–15)
BUN: 30 mg/dL — ABNORMAL HIGH (ref 6–20)
CALCIUM: 9.3 mg/dL (ref 8.9–10.3)
CO2: 22 mmol/L (ref 22–32)
CREATININE: 0.87 mg/dL (ref 0.61–1.24)
Chloride: 103 mmol/L (ref 101–111)
GFR calc non Af Amer: >60 mL/min (ref >60)
Glucose, Bld: 108 mg/dL — ABNORMAL HIGH (ref 65–99)
Potassium: 3.8 mmol/L (ref 3.5–5.1)
SODIUM: 138 mmol/L (ref 135–145)

## 2016-07-28 LAB — INFLUENZA PANEL BY PCR (TYPE A & B)
INFLBPCR: NEGATIVE
Influenza A By PCR: NEGATIVE

## 2016-07-28 LAB — MAGNESIUM: Magnesium: 2.1 mg/dL (ref 1.7–2.4)

## 2016-07-28 LAB — CBC
HCT: 52.8 % — ABNORMAL HIGH (ref 40.0–52.0)
HEMOGLOBIN: 17.3 g/dL (ref 13.0–18.0)
MCH: 28.6 pg (ref 26.0–34.0)
MCHC: 32.8 g/dL (ref 32.0–36.0)
MCV: 87.1 fL (ref 80.0–100.0)
PLATELETS: 410 10*3/uL (ref 150–440)
RBC: 6.06 MIL/uL — AB (ref 4.40–5.90)
RDW: 15.5 % — ABNORMAL HIGH (ref 11.5–14.5)
WBC: 11.8 10*3/uL — AB (ref 3.8–10.6)

## 2016-07-28 LAB — BLOOD GAS, VENOUS
ACID-BASE EXCESS: 1 mmol/L (ref 0.0–2.0)
BICARBONATE: 26 mmol/L (ref 20.0–28.0)
O2 Saturation: 88.2 %
PCO2 VEN: 42 mmHg — AB (ref 44.0–60.0)
PH VEN: 7.4 (ref 7.250–7.430)
PO2 VEN: 55 mmHg — AB (ref 32.0–45.0)
Patient temperature: 37

## 2016-07-28 LAB — TROPONIN I

## 2016-07-28 LAB — MRSA PCR SCREENING: MRSA by PCR: NEGATIVE

## 2016-07-28 MED ORDER — ADULT MULTIVITAMIN W/MINERALS CH
1.0000 | ORAL_TABLET | Freq: Every day | ORAL | Status: DC
  Administered 2016-07-28: 1 via ORAL
  Filled 2016-07-28: qty 1

## 2016-07-28 MED ORDER — ALBUTEROL SULFATE (2.5 MG/3ML) 0.083% IN NEBU
5.0000 mg | INHALATION_SOLUTION | Freq: Once | RESPIRATORY_TRACT | Status: AC
  Administered 2016-07-28: 5 mg via RESPIRATORY_TRACT
  Filled 2016-07-28: qty 6

## 2016-07-28 MED ORDER — PRAZOSIN HCL 1 MG PO CAPS
1.0000 mg | ORAL_CAPSULE | Freq: Every day | ORAL | Status: DC
  Administered 2016-07-28 – 2016-07-31 (×4): 1 mg via ORAL
  Filled 2016-07-28 (×6): qty 1

## 2016-07-28 MED ORDER — MELATONIN 5 MG PO TABS
5.0000 mg | ORAL_TABLET | Freq: Every day | ORAL | Status: DC
  Administered 2016-07-28 – 2016-07-31 (×4): 5 mg via ORAL
  Filled 2016-07-28 (×5): qty 1

## 2016-07-28 MED ORDER — GUAIFENESIN ER 600 MG PO TB12
600.0000 mg | ORAL_TABLET | Freq: Two times a day (BID) | ORAL | Status: DC | PRN
  Administered 2016-07-28 – 2016-07-31 (×2): 600 mg via ORAL
  Filled 2016-07-28 (×2): qty 1

## 2016-07-28 MED ORDER — IBUPROFEN 400 MG PO TABS: 400.0000 mg | ORAL_TABLET | Freq: Four times a day (QID) | ORAL | Status: DC | PRN

## 2016-07-28 MED ORDER — OXYCODONE HCL 5 MG PO TABS
10.0000 mg | ORAL_TABLET | ORAL | Status: DC | PRN
  Administered 2016-07-28 – 2016-08-01 (×17): 10 mg via ORAL
  Filled 2016-07-28 (×17): qty 2

## 2016-07-28 MED ORDER — SODIUM CHLORIDE 0.9 % IV BOLUS (SEPSIS)
1000.0000 mL | Freq: Once | INTRAVENOUS | Status: AC
  Administered 2016-07-28: 1000 mL via INTRAVENOUS

## 2016-07-28 MED ORDER — ONDANSETRON 4 MG PO TBDP
ORAL_TABLET | ORAL | Status: AC
  Administered 2016-07-28: 4 mg via ORAL
  Filled 2016-07-28: qty 1

## 2016-07-28 MED ORDER — ONDANSETRON HCL 4 MG PO TABS
4.0000 mg | ORAL_TABLET | Freq: Four times a day (QID) | ORAL | Status: DC | PRN
  Administered 2016-07-30: 4 mg via ORAL
  Filled 2016-07-28: qty 1

## 2016-07-28 MED ORDER — MOMETASONE FURO-FORMOTEROL FUM 200-5 MCG/ACT IN AERO
2.0000 | INHALATION_SPRAY | Freq: Two times a day (BID) | RESPIRATORY_TRACT | Status: DC
  Administered 2016-07-28 – 2016-08-01 (×8): 2 via RESPIRATORY_TRACT
  Filled 2016-07-28: qty 8.8

## 2016-07-28 MED ORDER — ONDANSETRON 4 MG PO TBDP
4.0000 mg | ORAL_TABLET | Freq: Once | ORAL | Status: AC | PRN
  Administered 2016-07-28: 4 mg via ORAL

## 2016-07-28 MED ORDER — SODIUM CHLORIDE 0.9 % IV SOLN
INTRAVENOUS | Status: DC
  Administered 2016-07-28: 60 mL/h via INTRAVENOUS

## 2016-07-28 MED ORDER — ONDANSETRON HCL 4 MG/2ML IJ SOLN: 4.0000 mg | Freq: Four times a day (QID) | INTRAMUSCULAR | Status: DC | PRN

## 2016-07-28 MED ORDER — AZITHROMYCIN 500 MG PO TABS
500.0000 mg | ORAL_TABLET | Freq: Every day | ORAL | Status: DC
  Administered 2016-07-29 – 2016-08-01 (×4): 500 mg via ORAL
  Filled 2016-07-28 (×4): qty 1

## 2016-07-28 MED ORDER — DOCUSATE SODIUM 100 MG PO CAPS
100.0000 mg | ORAL_CAPSULE | Freq: Two times a day (BID) | ORAL | Status: DC
  Administered 2016-07-28 – 2016-08-01 (×8): 100 mg via ORAL
  Filled 2016-07-28 (×8): qty 1

## 2016-07-28 MED ORDER — HYDROCOD POLST-CPM POLST ER 10-8 MG/5ML PO SUER
5.0000 mL | Freq: Once | ORAL | Status: AC
  Administered 2016-07-28: 5 mL via ORAL
  Filled 2016-07-28: qty 5

## 2016-07-28 MED ORDER — GABAPENTIN 400 MG PO CAPS
800.0000 mg | ORAL_CAPSULE | Freq: Four times a day (QID) | ORAL | Status: DC
  Administered 2016-07-28 – 2016-08-01 (×15): 800 mg via ORAL
  Filled 2016-07-28 (×15): qty 2

## 2016-07-28 MED ORDER — ENOXAPARIN SODIUM 40 MG/0.4ML ~~LOC~~ SOLN
40.0000 mg | SUBCUTANEOUS | Status: DC
  Administered 2016-07-28 – 2016-07-31 (×4): 40 mg via SUBCUTANEOUS
  Filled 2016-07-28 (×4): qty 0.4

## 2016-07-28 MED ORDER — LORATADINE 10 MG PO TABS: 10.0000 mg | ORAL_TABLET | Freq: Every day | ORAL | Status: DC | PRN

## 2016-07-28 MED ORDER — THEOPHYLLINE ER 200 MG PO CP24
400.0000 mg | ORAL_CAPSULE | Freq: Two times a day (BID) | ORAL | Status: DC
  Administered 2016-07-28 – 2016-08-01 (×8): 400 mg via ORAL
  Filled 2016-07-28 (×9): qty 2

## 2016-07-28 MED ORDER — DEXAMETHASONE SODIUM PHOSPHATE 10 MG/ML IJ SOLN
10.0000 mg | Freq: Once | INTRAMUSCULAR | Status: AC
  Administered 2016-07-28: 10 mg via INTRAVENOUS
  Filled 2016-07-28: qty 1

## 2016-07-28 MED ORDER — DEXAMETHASONE SODIUM PHOSPHATE 10 MG/ML IJ SOLN
10.0000 mg | Freq: Once | INTRAMUSCULAR | Status: AC
  Administered 2016-07-28: 10 mg via INTRAVENOUS

## 2016-07-28 MED ORDER — DEXAMETHASONE SODIUM PHOSPHATE 10 MG/ML IJ SOLN
INTRAMUSCULAR | Status: AC
  Administered 2016-07-28: 10 mg via INTRAVENOUS
  Filled 2016-07-28: qty 1

## 2016-07-28 MED ORDER — HYDROCOD POLST-CPM POLST ER 10-8 MG/5ML PO SUER: 5.0000 mL | Freq: Two times a day (BID) | ORAL | Status: DC | PRN

## 2016-07-28 MED ORDER — ALBUTEROL SULFATE (2.5 MG/3ML) 0.083% IN NEBU: 2.5000 mg | INHALATION_SOLUTION | RESPIRATORY_TRACT | Status: DC | PRN

## 2016-07-28 MED ORDER — FLUOXETINE HCL 20 MG PO CAPS
40.0000 mg | ORAL_CAPSULE | Freq: Every day | ORAL | Status: DC
  Administered 2016-07-28 – 2016-08-01 (×5): 40 mg via ORAL
  Filled 2016-07-28 (×5): qty 2

## 2016-07-28 MED ORDER — LEVOFLOXACIN IN D5W 750 MG/150ML IV SOLN
750.0000 mg | Freq: Once | INTRAVENOUS | Status: AC
  Administered 2016-07-28: 750 mg via INTRAVENOUS
  Filled 2016-07-28: qty 150

## 2016-07-28 MED ORDER — LIDOCAINE 5 % EX PTCH
1.0000 | MEDICATED_PATCH | CUTANEOUS | Status: DC
  Administered 2016-07-28 – 2016-07-31 (×4): 1 via TRANSDERMAL
  Filled 2016-07-28 (×5): qty 1

## 2016-07-28 MED ORDER — CLONAZEPAM 0.5 MG PO TABS
0.5000 mg | ORAL_TABLET | Freq: Two times a day (BID) | ORAL | Status: DC
  Administered 2016-07-28 – 2016-08-01 (×8): 0.5 mg via ORAL
  Filled 2016-07-28 (×8): qty 1

## 2016-07-28 MED ORDER — POLYETHYLENE GLYCOL 3350 17 G PO PACK
17.0000 g | PACK | Freq: Every day | ORAL | Status: DC | PRN
  Administered 2016-07-31: 17 g via ORAL
  Filled 2016-07-28: qty 1

## 2016-07-28 MED ORDER — METHYLPREDNISOLONE SODIUM SUCC 125 MG IJ SOLR
60.0000 mg | INTRAMUSCULAR | Status: DC
  Administered 2016-07-28: 60 mg via INTRAVENOUS
  Filled 2016-07-28: qty 2

## 2016-07-28 MED ORDER — TRAZODONE HCL 100 MG PO TABS
100.0000 mg | ORAL_TABLET | Freq: Every day | ORAL | Status: DC
  Administered 2016-07-28 – 2016-07-31 (×4): 100 mg via ORAL
  Filled 2016-07-28 (×4): qty 1

## 2016-07-28 NOTE — ED Triage Notes (Signed)
Pt to ED reporting weakness, diarrhea, vomiting, cough and weakness for the past week. Pt denies having had a flu test performed. PT presents to triage with SOB and cough with white sputum. Pt denies taking medication for fevers. Pt also reports highest temp at home was 99 F. Pt alert and oriented x 4 at this time.
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
## 2016-07-28 NOTE — ED Notes (Signed)
Admitting Provider at bedside. 
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
## 2016-07-28 NOTE — ED Notes (Signed)
Patient given meal tray.

## 2016-07-28 NOTE — ED Notes (Signed)
IV infiltrated with dexamethasone IV push.  MD notified and reordered new administration.

## 2016-07-28 NOTE — ED Triage Notes (Signed)
Per EMS report, patient c/o flu-like symptoms for one week. Patient was given one Albuterol neb treatment enroute. BS 111.

## 2016-07-29 LAB — CBC
HCT: 45.1 % (ref 40.0–52.0)
Hemoglobin: 14.9 g/dL (ref 13.0–18.0)
MCH: 29 pg (ref 26.0–34.0)
MCHC: 33.1 g/dL (ref 32.0–36.0)
MCV: 87.5 fL (ref 80.0–100.0)
PLATELETS: 292 10*3/uL (ref 150–440)
RBC: 5.15 MIL/uL (ref 4.40–5.90)
RDW: 15.7 % — AB (ref 11.5–14.5)
WBC: 6.6 10*3/uL (ref 3.8–10.6)

## 2016-07-29 LAB — BASIC METABOLIC PANEL
Anion gap: 8 (ref 5–15)
BUN: 35 mg/dL — AB (ref 6–20)
CO2: 25 mmol/L (ref 22–32)
CREATININE: 1.08 mg/dL (ref 0.61–1.24)
Calcium: 9.2 mg/dL (ref 8.9–10.3)
Chloride: 103 mmol/L (ref 101–111)
GFR calc Af Amer: >60 mL/min (ref >60)
GFR calc non Af Amer: >60 mL/min (ref >60)
Glucose, Bld: 277 mg/dL — ABNORMAL HIGH (ref 65–99)
Potassium: 4.1 mmol/L (ref 3.5–5.1)
SODIUM: 136 mmol/L (ref 135–145)

## 2016-07-29 MED ORDER — PREDNISONE 10 MG PO TABS: ORAL_TABLET | ORAL | 0 refills | Status: DC

## 2016-07-29 MED ORDER — AZITHROMYCIN 250 MG PO TABS: 250.0000 mg | ORAL_TABLET | Freq: Every day | ORAL | 0 refills | Status: DC

## 2016-07-29 MED ORDER — PREDNISONE 50 MG PO TABS
50.0000 mg | ORAL_TABLET | Freq: Every day | ORAL | Status: DC
  Administered 2016-07-29 – 2016-08-01 (×4): 50 mg via ORAL
  Filled 2016-07-29 (×4): qty 1

## 2016-07-29 NOTE — Care Management Note (Addendum)
Case Management Note  Patient Details  Name: Steven BRANDL Sr. MRN: [id number] Date of Birth: [birth year]  Subjective/Objective:  Met with patient at bedside to discuss discharge planning. Patient has been on home O2 in the past. He states he has been out for a while. After further questioning, patient finally admits that his concentrator is in a barn at a friends house. He states he will try and see if he can get it but it may take a while. TC to [facility]. They state they are still billing [place] for his concentrator so unless he wants to pay for a new one he can not received any additional oxygen at this time. Attending updated on oxygen.  Discussed home health nursing. He is agreeable with no agency preference. States he has difficulty bathing himself properly.  Referral made to [facility] for nursing, SW and HHA.  Patient lives alone. He uses a wheelchair at home and Medicaid transport to appointments. He states his PCP is at Dr. Lavone Neri office and he was last seen 2 -3 weeks ago. Chart says Dr. Brynda Greathouse.   Verified address with patient: [address]  [phone]    Action/Plan:   Expected Discharge Date:  07/29/16               Expected Discharge Plan:  [place]  In-House Referral:     Discharge planning Services  CM Consult  Post Acute Care Choice:    Choice offered to:  Patient  DME Arranged:    DME Agency:     HH Arranged:  RN, Nurse's Aide, Social Work [employer] Agency:  [facility]  Status of Service:  Completed, signed off  If discussed at [employer], dates discussed:    Additional Comments:  Jolly Mango, RN 07/29/2016, 2:12 PM
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
## 2016-07-29 NOTE — Progress Notes (Signed)
Gave patient discharge and RX instructions. Patient acknowledged understanding. He is not sure how he will get his meds and he is upset that he is not getting oxygen at home. (see [place] July 2:36pm note). Patient will be receiving home health upon d/c. IV removed.  Patient is trying to find someone to pick him up and take him home.

## 2016-07-29 NOTE — Progress Notes (Signed)
Nursing supervisor unable to contact family. MD notified. Discharge orders placed on hold until transportation can be worked out

## 2016-07-29 NOTE — Progress Notes (Signed)
Patient family called back stating that they would not be picking patient up tonight. When told that the patient had been discharged; person on the other end of the phone hung up. They did not answer when called back. Nursing supervisor notified.

## 2016-07-29 NOTE — Progress Notes (Signed)
Patient scheduled for discharge to be picked up by family and transported home. Family called from [phone] stating they were on their way to pick patient up. Family requested patient to be brought outside to the visitors entrance and they would call back when they were waiting outside.

## 2016-07-29 NOTE — Progress Notes (Signed)
Initial Nutrition Assessment  DOCUMENTATION CODES:   Obesity unspecified  INTERVENTION:  Recommend Ensure Enlive po BID, each supplement provides 350 kcal and 20 grams of protein. Discussed recommendation with patient.   NUTRITION DIAGNOSIS:   Inadequate oral intake related to poor appetite as evidenced by per patient/family report.  GOAL:   Patient will meet greater than or equal to 90% of their needs  MONITOR:   PO intake, Supplement acceptance, Labs, Weight trends  REASON FOR ASSESSMENT:   Malnutrition Screening Tool    ASSESSMENT:   61 y.o. malewith a known history of Peripheral neuropathy, traumatic brain injury, chronic low back pain, hypertension presents to hospital from home secondary to worsening shortness of breath and cough and weakness. Found to have acute exacerbation of COPD.   Spoke with patient at bedside. He reports his appetite is improving now but it was poor 3-4 days PTA due to abdominal pain, nausea, and diarrhea. Patient reports typical intake is 3 meals per day. He reports he eats a healthy diet and avoids fried foods.   Patient reports UBW 210 lbs. Patient believes he has lost weight with his poor PO on admission. Weight on admission is 210 lbs, which may just be recorded. RD unable to get bed scale weight as patient was sitting in chart.   Meal Completion: 100% of breakfast per chart  Medications reviewed and include: azithromycin, Colace, MVI daily.   Labs reviewed: Glucose 277, BUN 35.  Nutrition-Focused physical exam completed. Findings are no fat depletion, no muscle depletion, and no edema.   Diet Order:  Diet 2 gram sodium Room service appropriate? Yes; Fluid consistency: Thin  Skin:  Reviewed, no issues  Last BM:  07/27/2016  Height:   Ht Readings from Last 1 Encounters:  07/28/16 5\' 8"  (1.727 m)    Weight:   Wt Readings from Last 1 Encounters:  07/28/16 210 lb (95.3 kg)    Ideal Body Weight:  70 kg  BMI:  Body mass index  is 31.93 kg/m.  Estimated Nutritional Needs:   Kcal:  1475-1740 (MSJ x 0.85-1)  Protein:  75-95 grams (0.8-1 grams/kg)  Fluid:  1.8 L/day  EDUCATION NEEDS:   No education needs identified at this time  Willey Blade, MS, RD, LDN Pager: [phone] After Hours Pager: [phone]

## 2016-07-29 NOTE — Evaluation (Addendum)
Physical Therapy Evaluation Patient Details Name: Steven BERTEAU Sr. MRN: [id number] DOB: 1955/10/03 Today's Date: 07/29/2016   History of Present Illness  Patient is a 61 y/o male that presents with COPD exacerbation and worsenining shortness of breath.   Clinical Impression  Per patient he has been performing transfers to wheelchair only and has not performed any real ambulation in some time. Today he is able to perform bed mobility without physical assistance from PT, with RW he is able to perform sit to stand and ambulate to doorway without physical assistance or any loss of balance. Gait is slow, but no scissoring or buckling noted. It appears this is likely his recent baseline, reports he has help with groceries and largely stays at home. Given the above he would likely benefit from HHPT evaluation for set up and mobility needs.     Follow Up Recommendations Home health PT    Equipment Recommendations       Recommendations for Other Services       Precautions / Restrictions Precautions Precautions: Fall Restrictions Weight Bearing Restrictions: No      Mobility  Bed Mobility Overal bed mobility: Needs Assistance Bed Mobility: Supine to Sit     Supine to sit: Min guard     General bed mobility comments: No deficits identified in bed mobility, increased time to complete.   Transfers Overall transfer level: Needs assistance Equipment used: Rolling walker (2 wheeled) Transfers: Sit to/from Stand Sit to Stand: Supervision         General transfer comment: Patient is able to transfer without PT assistance with RW   Ambulation/Gait Ambulation/Gait assistance: Supervision Ambulation Distance (Feet): 15 Feet Assistive device: Rolling walker (2 wheeled) Gait Pattern/deviations: Decreased step length - right;Decreased step length - left;Step-through pattern;Trunk flexed   Gait velocity interpretation: <1.8 ft/sec, indicative of risk for recurrent falls General Gait  Details: Patient demonstrates decreased stride length and multiple pauses secondary to pain in lower back, no buckling or scissoring noted.   Stairs            Wheelchair Mobility    Modified Rankin (Stroke Patients Only)       Balance Overall balance assessment: Needs assistance Sitting-balance support: No upper extremity supported Sitting balance-Leahy Scale: Good     Standing balance support: Bilateral upper extremity supported Standing balance-Leahy Scale: Fair                               Pertinent Vitals/Pain Pain Assessment: Faces Faces Pain Scale: Hurts even more Pain Location: Lumbar spine  Pain Descriptors / Indicators: Aching Pain Intervention(s): Limited activity within patient's tolerance;Monitored during session;Repositioned    Home Living Family/patient expects to be discharged to:: Private residence Living Arrangements: Alone   Type of Home: House Home Access: Stairs to enter Entrance Stairs-Rails: Right Entrance Stairs-Number of Steps: 5-6 Home Layout: One level Home Equipment: Walker - 2 wheels;Walker - 4 wheels;Cane - single point;Wheelchair - manual      Prior Function Level of Independence: Independent with assistive device(s)         Comments: Patient reports he largely only transfers at this point from bed to wheelchair. Reports he has someone get his groceries.      Hand Dominance        Extremity/Trunk Assessment   Upper Extremity Assessment Upper Extremity Assessment: Overall WFL for tasks assessed    Lower Extremity Assessment Lower Extremity Assessment: Overall WFL for tasks  assessed (Patient reports he has had intermittent bouts in the past of not feeling his legs since back injury. He had numbness in legs this week, but could feel them today)       Communication   Communication: No difficulties  Cognition Arousal/Alertness: Awake/alert Behavior During Therapy: WFL for tasks assessed/performed Overall  Cognitive Status: Within Functional Limits for tasks assessed                      General Comments      Exercises     Assessment/Plan    PT Assessment Patient needs continued PT services  PT Problem List Decreased strength;Decreased mobility;Pain;Decreased knowledge of use of DME;Decreased balance;Decreased activity tolerance          PT Treatment Interventions DME instruction;Therapeutic activities;Therapeutic exercise;Gait training;Stair training;Balance training;Neuromuscular re-education    PT Goals (Current goals can be found in the Care Plan section)  Acute Rehab PT Goals Patient Stated Goal: To return home  PT Goal Formulation: With patient Time For Goal Achievement: 08/12/16 Potential to Achieve Goals: Good    Frequency Min 2X/week   Barriers to discharge Decreased caregiver support It sounds like patient lives alone, but he reports he has people who can help him with groceries.     Co-evaluation               End of Session Equipment Utilized During Treatment: Gait belt;Oxygen Activity Tolerance: Patient tolerated treatment well;Patient limited by pain Patient left: in chair;with chair alarm set;with call bell/phone within reach Nurse Communication: Mobility status    Functional Assessment Tool Used: Clinical judgement  Functional Limitation: Mobility: Walking and moving around Mobility: Walking and Moving Around Current Status [id number]): At least 1 percent but less than 20 percent impaired, limited or restricted Mobility: Walking and Moving Around Goal Status [phone]): At least 1 percent but less than 20 percent impaired, limited or restricted    Time: 0910-0925 PT Time Calculation (min) (ACUTE ONLY): 15 min   Charges:   PT Evaluation $PT Eval Moderate Complexity: 1 Procedure     PT G Codes:   PT G-Codes **NOT FOR INPATIENT CLASS** Functional Assessment Tool Used: Clinical judgement  Functional Limitation: Mobility: Walking and moving  around Mobility: Walking and Moving Around Current Status [id number]): At least 1 percent but less than 20 percent impaired, limited or restricted Mobility: Walking and Moving Around Goal Status [phone]): At least 1 percent but less than 20 percent impaired, limited or restricted   Kerman Passey, PT, DPT    07/29/2016, 12:00 PM

## 2016-07-29 NOTE — Progress Notes (Signed)
New Admission Note:   Arrival Method: per stretcher from ED, pt came from home Mental Orientation: alert and oriented X4 Telemetry: none ordered Assessment: Completed Skin: warm, dry, with old healed surgical incision on the mid lower back, no preexisting open wounds/sores noted IV: G20 on the right AC with transparent dressing, intact Pain: 8/10 scale, chronic pain on the lower back, will administer PRN pain medicine Tubes: chronic O2 inhalation via nasal prongs at 2Lpm Safety Measures: Safety Fall Prevention Plan has been given and discussed Admission: Completed 1A Orientation: Patient has been oriented to the room, unit and staff.  Family: no family member present at bedside this time  Orders have been reviewed and implemented. Will continue to monitor patient. Call light has been placed within reach and bed alarm has been activated.   Georgeanna Harrison BSN, RN [facility] 1A

## 2016-07-30 NOTE — Care Management Note (Signed)
Case Management Note  Patient Details  Name: Steven CLAMP Sr. MRN: [id number] Date of Birth: [birth year]  Subjective/Objective:    Steven Wiley reports that his home oxygen concentrator from [employer] is in a barn or outhouse of Steven Wiley [phone]. Dr Fritzi Mandes attempted unsuccessfully to contact Steven Wiley at home this morning. This CM will continue to try to contact Steven Wiley throughout the day. Per Steven Wiley he has not used home oxygen for several weeks prior to this hospital admission. This CM contacted Steven Wiley at [employer] who reports that it will cost $350.oo out of pocket for Steven Wiley to rent a concentrator from them. [employer] supplied Steven Wiley's original oxygen concentrator and charged it to Medicaid who will not pay for another concentrator within 5 years of the date the concentrator was originally provided.               Action/Plan:   Expected Discharge Date:  07/29/16               Expected Discharge Plan:  [place]  In-House Referral:     Discharge planning Services  CM Consult  Post Acute Care Choice:    Choice offered to:  Patient  DME Arranged:    DME Agency:     HH Arranged:  RN, Nurse's Aide, Social Work [employer] Agency:  [facility]  Status of Service:  Completed, signed off  If discussed at [employer], dates discussed:    Additional Comments:  Eddison Searls A, RN 07/30/2016, 8:44 AM

## 2016-07-30 NOTE — Care Management Note (Signed)
Case Management Note  Patient Details  Name: Steven SHABANI Sr. MRN: [id number] Date of Birth: [birth year]  Subjective/Objective:    Per Herma Ard RN, someone arrived reporting that they had a very  large oxygen concentrator with two refillable oxygen tanks in a truck in the hospital parking lot. When Alta RN went to the parking lot to determine if either of the portable oxygen tanks had oxygen, the truck was no longer in the parking lot. Dr Fritzi Mandes was updated.                Action/Plan:   Expected Discharge Date:  07/30/16               Expected Discharge Plan:  [place]  In-House Referral:     Discharge planning Services  CM Consult  Post Acute Care Choice:    Choice offered to:  Patient  DME Arranged:    DME Agency:     HH Arranged:  RN, Nurse's Aide, Social Work [employer] Agency:  [facility]  Status of Service:  Completed, signed off  If discussed at [employer], dates discussed:    Additional Comments:  Ellean Firman A, RN 07/30/2016, 2:53 PM

## 2016-07-30 NOTE — Progress Notes (Addendum)
Someone the patient knows (Mr. Coralyn Mark) said they would be coming in this morning to bring patient's concentrator. When he finally arrived he called nurse and he was very rude and impatient for [place] to bring pt out to his truck for discharge. Nurse asked person to bring the concentrator to the unit but he said it was too large and he was unable to bring to the unit for [place] to make sure it would work for the patient. When nurse went out to see the concentrator the person had left. Case manager advised Dr. Fritzi Mandes and per the doctor, the nurse discontinued the order to discharge.
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
## 2016-07-31 NOTE — Progress Notes (Signed)
[facility] [place] NAME: Steven Wiley    Steven#:  [id number]  DATE OF BIRTH:  09/24/1955  SUBJECTIVE:  No complaints  REVIEW OF SYSTEMS:   Review of Systems  Constitutional: Negative for chills, fever and weight loss.  HENT: Negative for ear discharge, ear pain and nosebleeds.   Eyes: Negative for blurred vision, pain and discharge.  Respiratory: Negative for sputum production, shortness of breath, wheezing and stridor.   Cardiovascular: Negative for chest pain, palpitations, orthopnea and PND.  Gastrointestinal: Negative for abdominal pain, diarrhea, nausea and vomiting.  Genitourinary: Negative for frequency and urgency.  Musculoskeletal: Negative for back pain and joint pain.  Neurological: Positive for weakness. Negative for sensory change, speech change and focal weakness.  Psychiatric/Behavioral: Negative for depression and hallucinations. The patient is not nervous/anxious.    Tolerating Diet:yes Tolerating PT:   DRUG ALLERGIES:   Allergies  Allergen Reactions  . Spiriva [Tiotropium Bromide Monohydrate] Other (See Comments)    Reaction:  Lung congestion  . Acetaminophen Hives and Nausea And Vomiting  . Advair Diskus [Fluticasone-Salmeterol] Other (See Comments)    Reaction:  Lung congestion  . Hydrocodone-Acetaminophen Hives and Nausea And Vomiting  . Tiotropium   . Penicillins Hives and Other (See Comments)    Reaction:  Blisters   Has patient had a PCN reaction causing immediate rash, facial/tongue/throat swelling, SOB or lightheadedness with hypotension:No Has patient had a PCN reaction causing severe rash involving mucus membranes or skin necrosis: No Has patient had a PCN reaction that required hospitalization No Has patient had a PCN reaction occurring within the last 10 years: Yes If all of the above answers are "NO", then may proceed with Cephalosporin use.     VITALS:  Blood pressure 136/84, pulse 84,  temperature 98 F (36.7 C), temperature source Oral, resp. rate 20, height 5\' 8"  (1.727 m), weight 95.3 kg (210 lb), SpO2 98 %.  PHYSICAL EXAMINATION:   Physical Exam  GENERAL:  61 y.o.-year-old patient lying in the bed with no acute distress.  EYES: Pupils equal, round, reactive to light and accommodation. No scleral icterus. Extraocular muscles intact.  HEENT: Head atraumatic, normocephalic. Oropharynx and nasopharynx clear.  NECK:  Supple, no jugular venous distention. No thyroid enlargement, no tenderness.  LUNGS: Normal breath sounds bilaterally, no wheezing, rales, rhonchi. No use of accessory muscles of respiration.  CARDIOVASCULAR: S1, S2 normal. No murmurs, rubs, or gallops.  ABDOMEN: Soft, nontender, nondistended. Bowel sounds present. No organomegaly or mass.  EXTREMITIES: No cyanosis, clubbing or edema b/l.    NEUROLOGIC: Cranial nerves II through XII are intact. No focal Motor or sensory deficits b/l.   PSYCHIATRIC:  patient is alert and oriented x 3.  SKIN: No obvious rash, lesion, or ulcer.   LABORATORY PANEL:  CBC  Recent Labs Lab 07/29/16 0452  WBC 6.6  HGB 14.9  HCT 45.1  PLT 292    Chemistries   Recent Labs Lab 07/28/16 1515 07/29/16 0452  NA 138 136  K 3.8 4.1  CL 103 103  CO2 22 25  GLUCOSE 108* 277*  BUN 30* 35*  CREATININE 0.87 1.08  CALCIUM 9.3 9.2  MG 2.1  --    Cardiac Enzymes  Recent Labs Lab 07/28/16 1515  TROPONINI <0.03   RADIOLOGY:  No results found. ASSESSMENT AND PLAN:  Steven Wiley a 61 y.o. malewith a known history of Peripheral neuropathy, traumatic brain injury, chronic low back pain, hypertension presents to hospital from  home secondary to worsening shortness of breath and cough and weakness.  #1 acute on chronic COPD exacerbation-with bronchitis. -Continue oxygen support. Care management consult for arranging home oxygen. -Started on IV steroids, DuoNeb nebs and continue his home inhalers. Z-Pak for  bronchitis. -Influenza test is negative. No active fevers at this time. -pt improved. Appears at baseline -sats >92% on 2 liters(chronic oxygen at home) -pt did not go home since his oxygen concentrator was at a barn. His friend Alfonso Ramus apparently has it. Not sure how much oxygen is available to use.  -IT has been very challenging to discharge Steven Wiley for last 2 days due to inability to get his oxygen which is lying in a barn at his friend's place  #2 depression and anxiety-continue his home medications.  -Patient on Prozac, trazodone and Klonopin.  #3 chronic low back pain-continue muscle relaxants, oxycodone that he takes at home.   #4 peripheral neuropathy-continue home medications  #5 DVT prophylaxis-on Lovenox  Overall improving Case discussed with Care Management/Social Worker. Management plans discussed with the patient, family and they are in agreement.  CODE STATUS: full DVT Prophylaxis: lovenox TOTAL TIME TAKING CARE OF THIS PATIENT: 25 minutes.  >50% time spent on counselling and coordination of care  Pt is READy for discharge for last 2 days. Awaiting a way as to how to get his oxygen tank and concentrator.  Note: This dictation was prepared with Dragon dictation along with smaller phrase technology. Any transcriptional errors that result from this process are unintentional.  Ercie Eliasen M.D on 07/31/2016 at 7:00 AM  Between 7am to 6pm - Pager - [phone]  After 6pm go to [email or web address] - password [email or web address] [place] [facility]  Office  [phone]  CC: Primary care physician; Marden Noble, MD

## 2016-08-01 MED ORDER — SENNOSIDES-DOCUSATE SODIUM 8.6-50 MG PO TABS: 1.0000 | ORAL_TABLET | Freq: Every day | ORAL | Status: DC

## 2016-08-01 NOTE — Care Management Note (Signed)
Case Management Note  Patient Details  Name: Steven PREAST Sr. MRN: [id number] Date of Birth: 08-Oct-1955  Subjective/Objectivified of dicahrgee:  [facility] notified of discharge.                  Action/Plan:   Expected Discharge Date:  08/01/16               Expected Discharge Plan:  [place]  In-House Referral:     Discharge planning Services  CM Consult  Post Acute Care Choice:    Choice offered to:  Patient  DME Arranged:    DME Agency:     HH Arranged:  RN, Nurse's Aide, Social Work [employer] Agency:  [facility]  Status of Service:  Completed, signed off  If discussed at [employer], dates discussed:    Additional Comments:  Jolly Mango, RN 08/01/2016, 2:51 PM

## 2016-08-01 NOTE — Care Management (Signed)
Patients ride from [facility] is in patients room to take him home.

## 2016-08-01 NOTE — Progress Notes (Signed)
Patient is being discharged home. No IV site. Discussed with Steven Wiley, CM about my concern patient going home without oxygen. Walking sats went down to 85% from the bed to the door. Explained that patient may come back to ER if oxygen is not sent with patient. According to patient his friend as Conservation officer, nature in his barn. See previous nursing notes. Case management aware.

## 2016-08-01 NOTE — Progress Notes (Signed)
Physical Therapy Treatment Patient Details Name: Steven Wiley. MRN: [id number] DOB: 11/28/1955 Today's Date: 08/01/2016    History of Present Illness Patient is a 61 y/o male that presents with COPD exacerbation and worsenining shortness of breath.     PT Comments    Pt agreeable to PT; reports chronic pain in back and right calf. Pt participates in supine and seated exercises; emphasis on abdominal stab for support to back and quality exercise. Pt has questions regarding progression of exercises as well as sets/repetitions. Education provided on said topics and quality versus quantity. Pt performs bed mobility and transfers with Supervision; ambulation deferred due to pt noting increasing fatigue/weakness with mild nausea. Pt returned to bed. Pt O2 saturation initially 87%, as nasal canula in place, but concentrator not running. Placed to 2 liters with improvement to 92% and remain so throughout session. Continue PT to progress strength, endurance and all functional mobility to allow an optimal/safe return home.   Follow Up Recommendations  Home health PT     Equipment Recommendations   (has rollator and wc)    Recommendations for Other Services       Precautions / Restrictions Precautions Precautions: Fall Restrictions Weight Bearing Restrictions: No    Mobility  Bed Mobility Overal bed mobility: Needs Assistance Bed Mobility: Supine to Sit;Sit to Supine     Supine to sit: Supervision Sit to supine: Supervision   General bed mobility comments: Slow; educated on abdominal brace with function as well to protect back  Transfers Overall transfer level: Needs assistance Equipment used: Rolling walker (2 wheeled) Transfers: Sit to/from Stand Sit to Stand: Supervision         General transfer comment: Slow to rise, but safe/steady  Ambulation/Gait             General Gait Details: Deferred, as pt beginning to feel mildly nauseated/fatigued   Stairs            Wheelchair Mobility    Modified Rankin (Stroke Patients Only)       Balance Overall balance assessment: Needs assistance Sitting-balance support: Feet supported Sitting balance-Leahy Scale: Good     Standing balance support: Bilateral upper extremity supported Standing balance-Leahy Scale: Fair (Fair +)                      Cognition Arousal/Alertness: Awake/alert Behavior During Therapy: WFL for tasks assessed/performed Overall Cognitive Status: Within Functional Limits for tasks assessed                      Exercises General Exercises - Lower Extremity Ankle Circles/Pumps: AROM;Both;20 reps;Supine Quad Sets: Strengthening;Both;20 reps;Supine Gluteal Sets: Strengthening;Both;20 reps;Supine Short Arc Quad: AROM;Both;20 reps;Supine Long Arc Quad: AROM;Both;10 reps;Seated Heel Slides: AROM;Both;20 reps;Supine Hip ABduction/ADduction: AROM;Both;20 reps;Supine Straight Leg Raises: AAROM;Both;20 reps;Supine Hip Flexion/Marching: Strengthening;AROM;Both;10 reps;Seated Other Exercises Other Exercises: Abdominal brace x 10 and with LE exercises with education on importance Other Exercises: Education on progression of exercises and body cues for appropriate reps/sets    General Comments        Pertinent Vitals/Pain Pain Assessment:  (Does not quantify) Pain Location: LB, R calf; chronic Pain Intervention(s): Monitored during session    Home Living                      Prior Function            PT Goals (current goals can now be found in the care plan section)  Progress towards PT goals: Progressing toward goals (slowly)    Frequency    Min 2X/week      PT Plan Current plan remains appropriate    Co-evaluation             End of Session Equipment Utilized During Treatment: Oxygen Activity Tolerance: Patient limited by fatigue Patient left: in bed;with call bell/phone within reach;with bed alarm set     Time:  1109-1150 PT Time Calculation (min) (ACUTE ONLY): 41 min  Charges:  $Therapeutic Exercise: 23-37 mins $Therapeutic Activity: 8-22 mins                    G CodesLarae Grooms, PTA 08/01/2016, 12:03 PM

## 2016-08-01 NOTE — Progress Notes (Signed)
SATURATION QUALIFICATIONS: (This note is used to comply with regulatory documentation for home oxygen)  Patient Saturations on Room Air at Rest = 93%  Patient Saturations on Room Air while Ambulating = 85%  Patient Saturations on 2 Liters of oxygen while Ambulating = 92%  Please briefly explain why patient needs home oxygen: patient get SOB with activity. Desated while walking a very short distance from the bed to the door.

## 2016-08-01 NOTE — Care Management Note (Signed)
Case Management Note  Patient Details  Name: Steven HANOHANO Sr. MRN: [id number] Date of Birth: April 07, 1956  Subjective/Objective: This RNCM spoke with case Technical sales engineer to update her that I was unable to reach [place] at the number provided. She felt we had done all possible to secure patients oxygen tank without success . She stated that the patient would need to be discharged today since he was medically stable. Spoke with Dr. Posey Pronto and she in in agreement with POC.  This RNCM went in to speak with patient. I ask him to call [name] himself and call me with the results. Patient secured a ride with [place] for a ride home today. Dr. Posey Pronto updated. Offered SNF annd patient states he is still trying to get his check back from the las time he went and he did not want to go back. [name] still would not answer patient or rncm calls so he will not be discharged on home O2.    Action/Plan:   Expected Discharge Date:  07/30/16               Expected Discharge Plan:  [place]  In-House Referral:     Discharge planning Services  CM Consult  Post Acute Care Choice:    Choice offered to:  Patient  DME Arranged:    DME Agency:     HH Arranged:  RN, Nurse's Aide, Social Work [employer] Agency:  [facility]  Status of Service:  In process, will continue to follow  If discussed at Long Length of Stay Meetings, dates discussed:    Additional Comments:  Jolly Mango, RN 08/01/2016, 12:01 PM

## 2016-08-01 NOTE — Care Management (Addendum)
Attempted to call Steven Wiley [phone] on multiple different numbers.  No answer. Unable to leave message as VM says it is full.   Requested primary RN, Roselyn Reef,  get new qualifying O2 sats.   TC to [employer]. His son [name] answered and after I began asking questions he hung up on RNCM 2 different time.
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
## 2016-08-15 ENCOUNTER — Emergency Department
Admission: EM | Admit: 2016-08-15 | Discharge: 2016-08-15 | Disposition: A | Discharge status: Home / Self Care | Payer: Medicaid Other | Attending: Emergency Medicine | Admitting: Emergency Medicine

## 2016-08-15 ENCOUNTER — Emergency Department: Payer: Medicaid Other

## 2016-08-15 ENCOUNTER — Encounter: Payer: Self-pay | Admitting: Emergency Medicine

## 2016-08-15 DIAGNOSIS — R002 Palpitations: Secondary | ICD-10-CM | POA: Diagnosis not present

## 2016-08-15 DIAGNOSIS — Z79899 Other long term (current) drug therapy: Secondary | ICD-10-CM | POA: Insufficient documentation

## 2016-08-15 DIAGNOSIS — J449 Chronic obstructive pulmonary disease, unspecified: Secondary | ICD-10-CM | POA: Insufficient documentation

## 2016-08-15 DIAGNOSIS — R61 Generalized hyperhidrosis: Secondary | ICD-10-CM | POA: Insufficient documentation

## 2016-08-15 DIAGNOSIS — J45909 Unspecified asthma, uncomplicated: Secondary | ICD-10-CM | POA: Insufficient documentation

## 2016-08-15 DIAGNOSIS — I2510 Atherosclerotic heart disease of native coronary artery without angina pectoris: Secondary | ICD-10-CM | POA: Diagnosis not present

## 2016-08-15 DIAGNOSIS — F17210 Nicotine dependence, cigarettes, uncomplicated: Secondary | ICD-10-CM | POA: Insufficient documentation

## 2016-08-15 DIAGNOSIS — I10 Essential (primary) hypertension: Secondary | ICD-10-CM | POA: Diagnosis not present

## 2016-08-15 DIAGNOSIS — R079 Chest pain, unspecified: Secondary | ICD-10-CM | POA: Insufficient documentation

## 2016-08-15 DIAGNOSIS — R0602 Shortness of breath: Secondary | ICD-10-CM | POA: Insufficient documentation

## 2016-08-15 LAB — CBC
HCT: 47.5 % (ref 40.0–52.0)
HEMOGLOBIN: 16.4 g/dL (ref 13.0–18.0)
MCH: 30.1 pg (ref 26.0–34.0)
MCHC: 34.5 g/dL (ref 32.0–36.0)
MCV: 87.2 fL (ref 80.0–100.0)
Platelets: 185 10*3/uL (ref 150–440)
RBC: 5.45 MIL/uL (ref 4.40–5.90)
RDW: 16.1 % — AB (ref 11.5–14.5)
WBC: 10.3 10*3/uL (ref 3.8–10.6)

## 2016-08-15 LAB — BASIC METABOLIC PANEL
ANION GAP: 7 (ref 5–15)
BUN: 23 mg/dL — AB (ref 6–20)
CO2: 28 mmol/L (ref 22–32)
Calcium: 9.1 mg/dL (ref 8.9–10.3)
Chloride: 102 mmol/L (ref 101–111)
Creatinine, Ser: 0.82 mg/dL (ref 0.61–1.24)
GFR calc Af Amer: >60 mL/min (ref >60)
Glucose, Bld: 98 mg/dL (ref 65–99)
POTASSIUM: 4.6 mmol/L (ref 3.5–5.1)
SODIUM: 137 mmol/L (ref 135–145)

## 2016-08-15 LAB — TROPONIN I: Troponin I: <0.03 ng/mL (ref <0.03)

## 2016-08-15 MED ORDER — LORAZEPAM 0.5 MG PO TABS: 0.5000 mg | ORAL_TABLET | Freq: Three times a day (TID) | ORAL | 0 refills | Status: AC | PRN

## 2016-08-15 MED ORDER — LORAZEPAM 2 MG/ML IJ SOLN
1.0000 mg | Freq: Once | INTRAMUSCULAR | Status: AC
  Administered 2016-08-15: 1 mg via INTRAVENOUS
  Filled 2016-08-15: qty 1

## 2016-08-15 MED ORDER — ALBUTEROL SULFATE (2.5 MG/3ML) 0.083% IN NEBU
5.0000 mg | INHALATION_SOLUTION | Freq: Once | RESPIRATORY_TRACT | Status: AC
  Administered 2016-08-15: 5 mg via RESPIRATORY_TRACT
  Filled 2016-08-15: qty 6

## 2016-08-15 NOTE — Discharge Instructions (Signed)
You have been seen in the emergency department today for chest pain/palpitations. Your workup has shown normal results. As we discussed please follow-up with your primary care physician in the next 1-2 days for recheck. Return to the emergency department for any further chest pain, trouble breathing, or any other symptom personally concerning to yourself. Please call the number provided for cardiology to arrange a follow-up appointment within the next 1-2 weeks for recheck/reevaluation, and consideration of further testing such as stress test.
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
## 2016-08-15 NOTE — ED Triage Notes (Signed)
Pt via ems from home with chest pain since Saturday that has gotten worse. Pt states he has not been able to sleep in 3 days and feels "wired." pt reports hx of panic attacks. He takes prozac daily but does not feel like it is helping. Pt alert & oriented, with obvious distress (heavy, loud breathing).

## 2016-08-15 NOTE — ED Notes (Signed)
MD Pduchowski at bedside

## 2016-08-15 NOTE — ED Notes (Signed)
Pt understands that he is being discharged, verbalized understanding that his labs and scans returned WNL.  Pt upset that he is being placed in lobby to wait for ride.  Explained to to that he is medically stable to be discharge, that his VS are stable, he is not on O2 and that he is A/OX4 and at baseline mentally.  Pt sts that his brother would not be able to pick him up until 2000. Informed pt that would not be able to stay in room for 2 hours as other patients that were not medically cleared would need room.  Pt requested that he be given ativan before he is discharged while he waits.  Request relayed to MD [facility], who denied pt request.  Pt able to stand to move around room in order to turn on TV and obtain water w/o difficulty.  Pt wheeled to lobby, placed in rear of lobby per pt request and given 2 warm blankets.

## 2016-08-15 NOTE — ED Notes (Signed)
Patient transported to X-ray 

## 2016-10-25 IMAGING — CT CT CHEST W/ CM
1 series · 14 of 33 positions shown, 18 images · IV contrast (omnipaque)
Comparison: Chest CT 06/18/2015.

CLINICAL DATA: 59-year-old male with low oxygen saturation,
presenting with cough and shortness of breath over the past several
days. Abnormal chest x-ray.

EXAM:
CT CHEST WITH CONTRAST
TECHNIQUE: Multidetector CT imaging of the chest was performed during
intravenous contrast administration.
CONTRAST:  75mL OMNIPAQUE IOHEXOL 350 MG/ML SOLN

[Series 2: routine chest with · axial · 0.76mm/px · z∈[-400,-85]mm · 14 of 75 slices shown, 18 images]
[im 6/75  mediastinal]
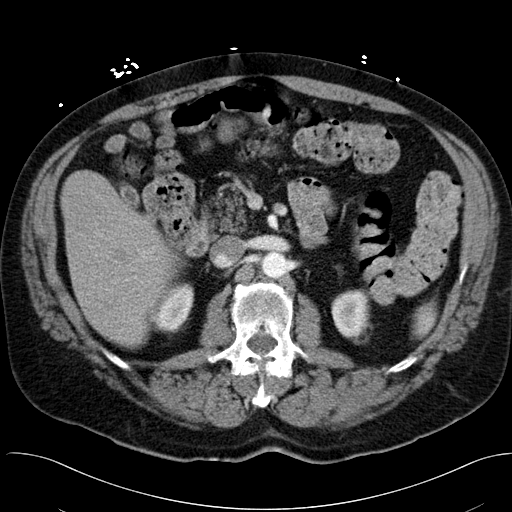
[im 6/75  lung]
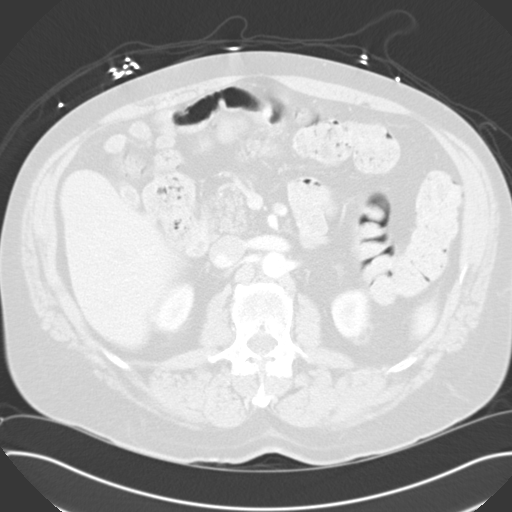
[im 11/75  lung]
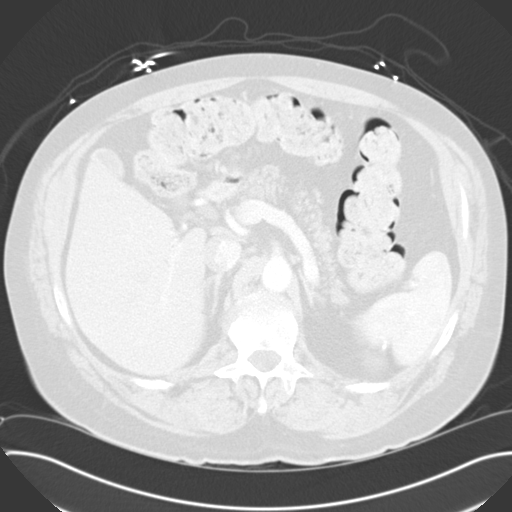
[im 15/75  lung]
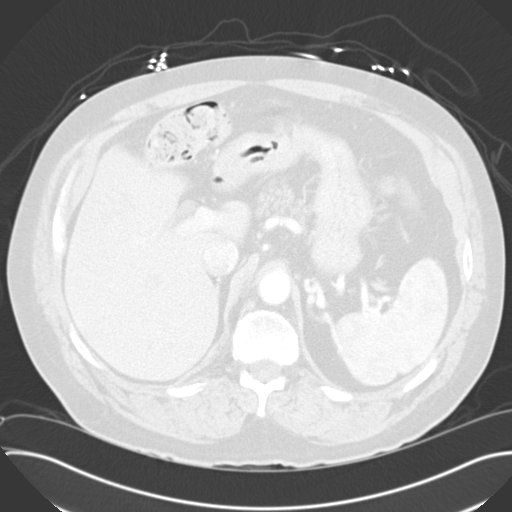
[im 20/75  lung]
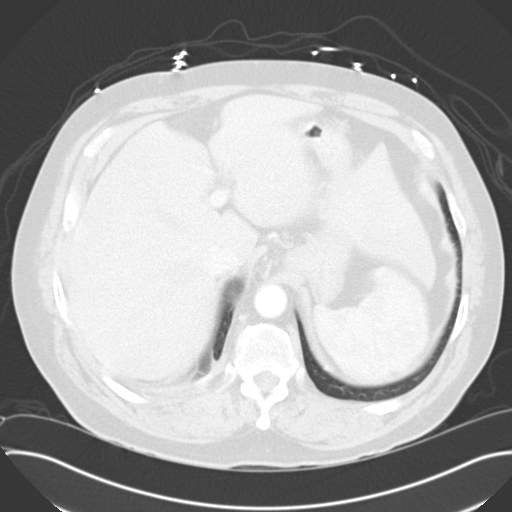
[im 25/75  mediastinal]
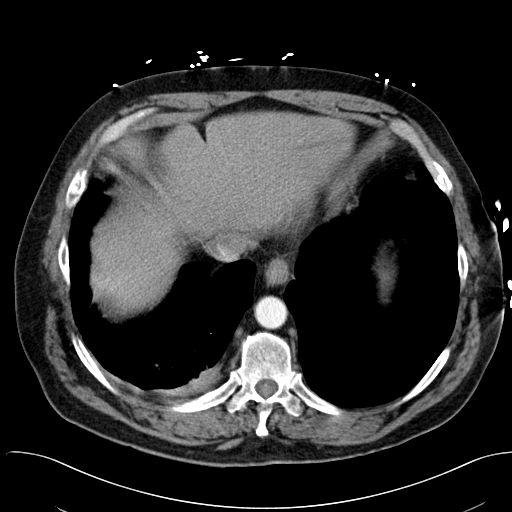
[im 25/75  lung]
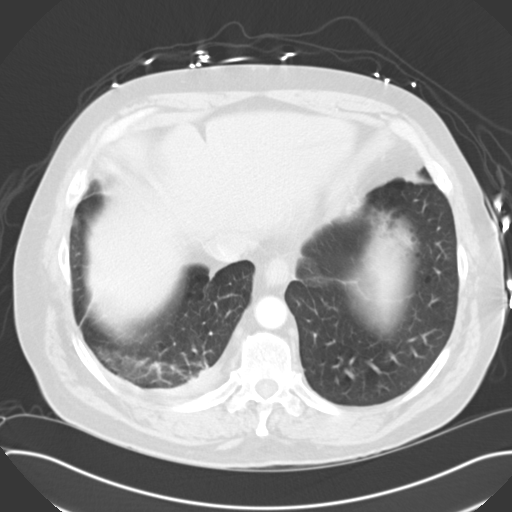
[im 31/75  lung]
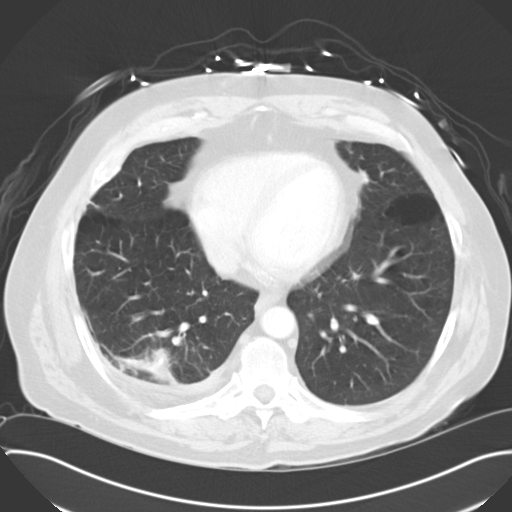
[im 36/75  lung]
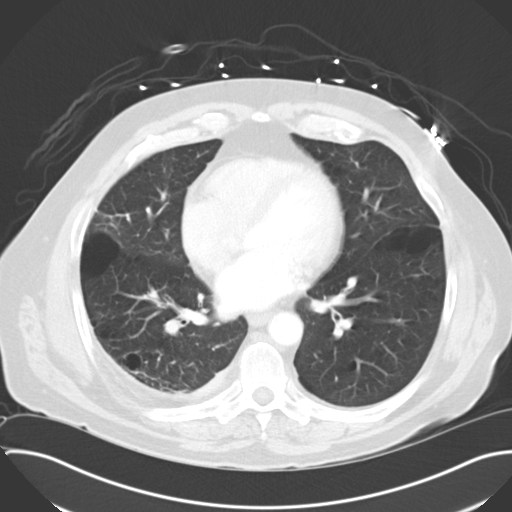
[im 40/75  lung]
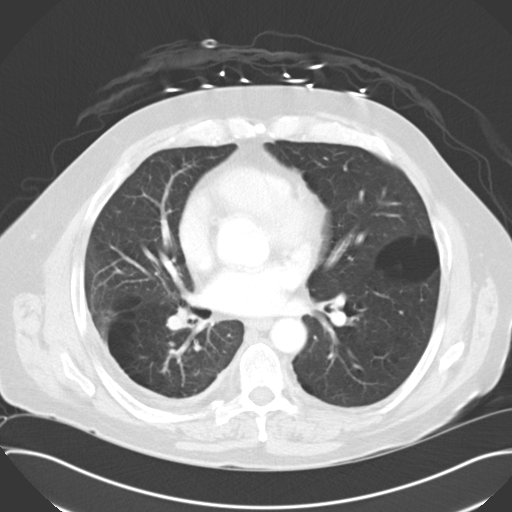
[im 44/75  mediastinal]
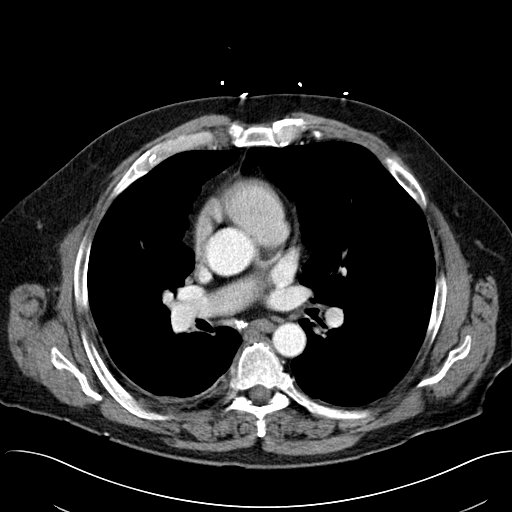
[im 44/75  lung]
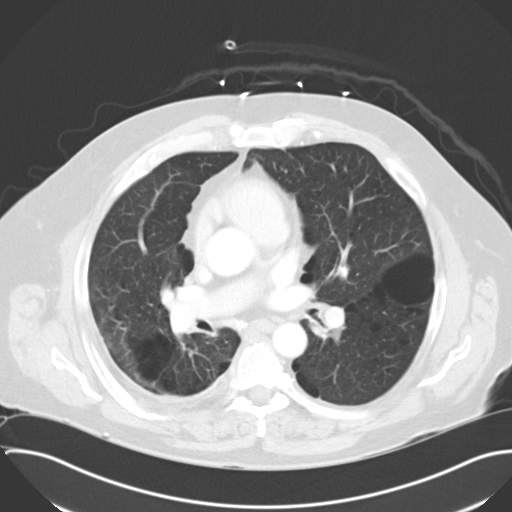
[im 50/75  lung]
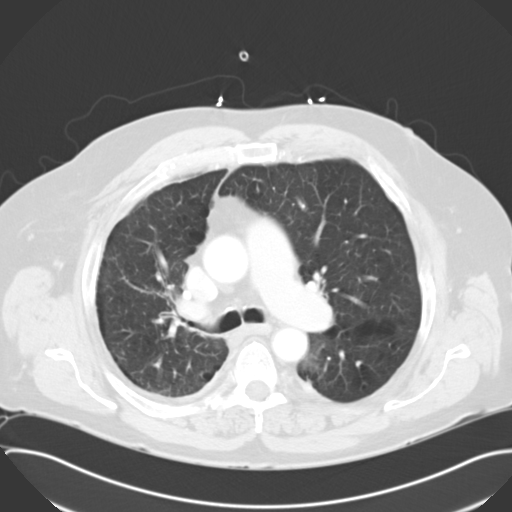
[im 55/75  lung]
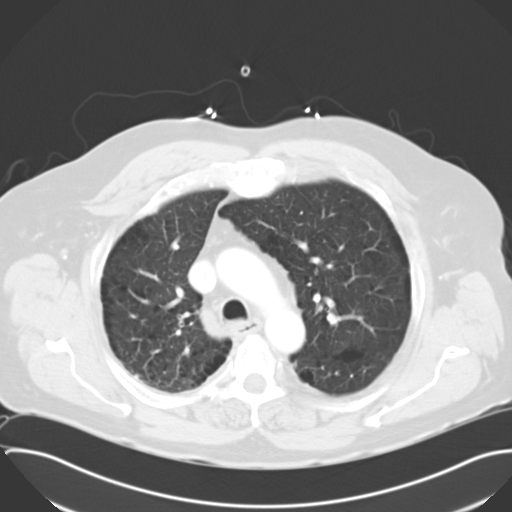
[im 60/75  lung]
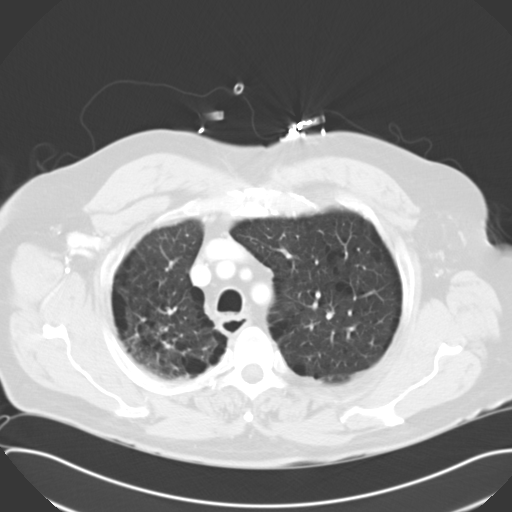
[im 64/75  mediastinal]
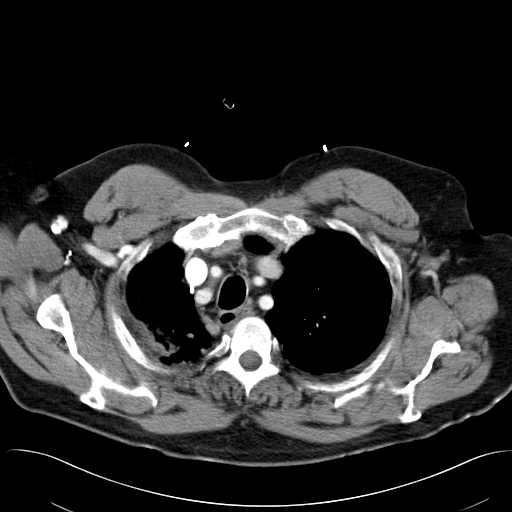
[im 64/75  lung]
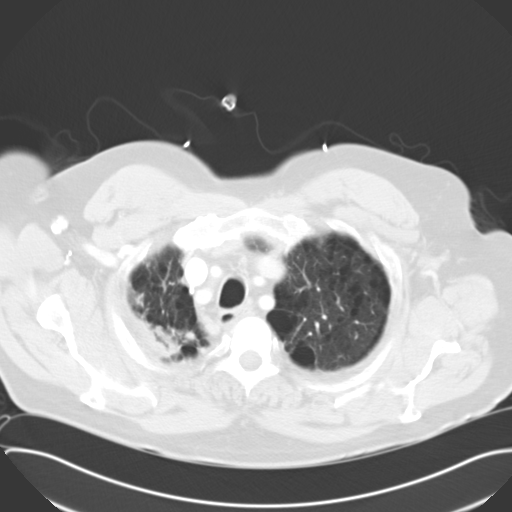
[im 69/75  lung]
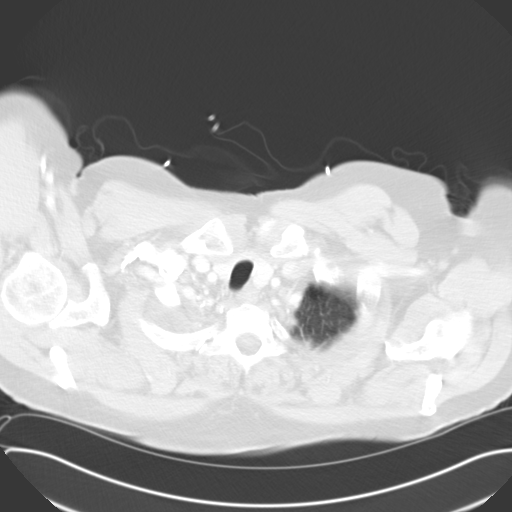

[14 of 33 positions shown; findings below may reference images not displayed]

FINDINGS: Mediastinum/Lymph Nodes: Heart size is normal. There is no
significant pericardial fluid, thickening or pericardial
calcification. There is atherosclerosis of the thoracic aorta, the
great vessels of the mediastinum and the coronary arteries,
including calcified atherosclerotic plaque in the left anterior
descending coronary artery. Pulmonic trunk is dilated measuring up
to 3.7 cm in diameter. No pathologically enlarged mediastinal or
hilar lymph nodes. Multiple densely calcified right hilar and
subcarinal lymph nodes. Esophagus is unremarkable in appearance. No
axillary lymphadenopathy.

Lungs/Pleura: Chronic right-sided pleural fluid and/or thickening
and chronic right apical pleuroparenchymal nodular thickening
unchanged, most compatible with chronic post infectious or
inflammatory scarring. Finding in the right lower lobe noted on the
recent chest radiograph corresponds to a new area of nodularity in
the right lower lobe best demonstrated on sagittal image 59 of
series 6 and axial image 45 of series 3 where this area measures
approximately 1.2 x 2.0 x 1.5 cm. This is immediately adjacent to
the area of linear scarring previously demonstrated in this region,
and is immediately beneath a small calcified granuloma, both of
which features are unchanged. No cavitation in this area is
identified. No other definite suspicious appearing pulmonary nodules
or masses. Mild diffuse bronchial wall thickening with moderate
paraseptal and mild centrilobular emphysema.

Upper Abdomen: Calcified granulomas in the spleen.

Musculoskeletal/Soft Tissues: There are no aggressive appearing
lytic or blastic lesions noted in the visualized portions of the
skeleton.
IMPRESSION: 1. There is a new area of nodularity in the right lower lobe which
corresponds to the perceived abnormality on the recent chest
radiograph. This does not demonstrate central cavitation. Given the
rapid development of this area since the prior study from
06/18/2015, this is strongly favored to be infectious or
inflammatory in etiology. Attention on follow-up chest x-rays is
recommended to ensure complete resolution of this finding.
2. Diffuse bronchial wall thickening with mild centrilobular and
moderate paraseptal emphysema; imaging findings suggestive of
underlying COPD.
3. Dilatation of the pulmonic trunk (3.7 cm in diameter), suggesting
pulmonary arterial hypertension.
4. Chronic right-sided pleuroparenchymal scarring, similar to the
prior study.
5. Sequelae of old granulomatous disease redemonstrated, as above.

## 2016-12-01 DIAGNOSIS — E8801 Alpha-1-antitrypsin deficiency: Secondary | ICD-10-CM | POA: Insufficient documentation

## 2016-12-01 MED ORDER — ALPHA1-PROTEINASE INHIBITOR 1000 MG IV SOLR: 5720.0000 mg | Freq: Once | INTRAVENOUS | Status: DC

## 2016-12-01 MED ORDER — ALPHA1-PROTEINASE INHIBITOR 1000 MG IV SOLR
5720.0000 mg | Freq: Once | INTRAVENOUS | Status: AC
Start: 2016-12-02 — End: 2016-12-02
  Administered 2016-12-02: 5720 mg via INTRAVENOUS
  Filled 2016-12-01: qty 5720
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
## 2016-12-02 ENCOUNTER — Inpatient Hospital Stay: Payer: Medicaid Other

## 2016-12-02 ENCOUNTER — Inpatient Hospital Stay: Payer: Medicaid Other | Attending: Oncology | Admitting: Oncology

## 2016-12-02 ENCOUNTER — Encounter: Payer: Self-pay | Admitting: Oncology

## 2016-12-02 VITALS — BP 82/52 | HR 50 | Temp 97.4°F | Resp 18

## 2016-12-02 DIAGNOSIS — Z79899 Other long term (current) drug therapy: Secondary | ICD-10-CM

## 2016-12-02 DIAGNOSIS — F419 Anxiety disorder, unspecified: Secondary | ICD-10-CM | POA: Insufficient documentation

## 2016-12-02 DIAGNOSIS — R05 Cough: Secondary | ICD-10-CM | POA: Insufficient documentation

## 2016-12-02 DIAGNOSIS — R531 Weakness: Secondary | ICD-10-CM | POA: Insufficient documentation

## 2016-12-02 DIAGNOSIS — F17210 Nicotine dependence, cigarettes, uncomplicated: Secondary | ICD-10-CM | POA: Insufficient documentation

## 2016-12-02 DIAGNOSIS — F329 Major depressive disorder, single episode, unspecified: Secondary | ICD-10-CM | POA: Insufficient documentation

## 2016-12-02 DIAGNOSIS — E8801 Alpha-1-antitrypsin deficiency: Secondary | ICD-10-CM | POA: Diagnosis not present

## 2016-12-02 DIAGNOSIS — Z801 Family history of malignant neoplasm of trachea, bronchus and lung: Secondary | ICD-10-CM | POA: Diagnosis not present

## 2016-12-02 DIAGNOSIS — K219 Gastro-esophageal reflux disease without esophagitis: Secondary | ICD-10-CM | POA: Diagnosis not present

## 2016-12-02 DIAGNOSIS — G8929 Other chronic pain: Secondary | ICD-10-CM | POA: Insufficient documentation

## 2016-12-02 DIAGNOSIS — J449 Chronic obstructive pulmonary disease, unspecified: Secondary | ICD-10-CM | POA: Insufficient documentation

## 2016-12-02 DIAGNOSIS — Z800 Family history of malignant neoplasm of digestive organs: Secondary | ICD-10-CM | POA: Insufficient documentation

## 2016-12-02 DIAGNOSIS — Z808 Family history of malignant neoplasm of other organs or systems: Secondary | ICD-10-CM | POA: Insufficient documentation

## 2016-12-02 DIAGNOSIS — Z8701 Personal history of pneumonia (recurrent): Secondary | ICD-10-CM | POA: Insufficient documentation

## 2016-12-02 DIAGNOSIS — I10 Essential (primary) hypertension: Secondary | ICD-10-CM | POA: Insufficient documentation

## 2016-12-02 DIAGNOSIS — G629 Polyneuropathy, unspecified: Secondary | ICD-10-CM | POA: Insufficient documentation

## 2016-12-02 DIAGNOSIS — R5383 Other fatigue: Secondary | ICD-10-CM | POA: Insufficient documentation

## 2016-12-02 DIAGNOSIS — R0602 Shortness of breath: Secondary | ICD-10-CM | POA: Insufficient documentation

## 2016-12-02 DIAGNOSIS — Z85828 Personal history of other malignant neoplasm of skin: Secondary | ICD-10-CM | POA: Insufficient documentation

## 2016-12-02 DIAGNOSIS — J9621 Acute and chronic respiratory failure with hypoxia: Secondary | ICD-10-CM

## 2016-12-02 DIAGNOSIS — I2510 Atherosclerotic heart disease of native coronary artery without angina pectoris: Secondary | ICD-10-CM | POA: Insufficient documentation

## 2016-12-02 NOTE — Progress Notes (Signed)
Patient' s  BP is running low today. I asked if that is normal for him. He told me he took a BP right before bed late last night and another 1 early this am about 8 hours apart. He thinks these have caught up with him, Supposed to be about 12 hours apart. He also diidn't get any sleep last night.   Patient tolerated his first dose of Prolactin C this am.

## 2016-12-06 ENCOUNTER — Encounter: Payer: Self-pay | Admitting: Family Medicine

## 2017-02-07 ENCOUNTER — Telehealth: Payer: Self-pay | Admitting: *Deleted

## 2017-02-07 NOTE — Telephone Encounter (Signed)
Left message for patient to call back to make an appointment with Dr.Sankar for Umbilical hernia, no recent studies. Dr Brunetta Genera is the referring Dr.

## 2017-03-02 ENCOUNTER — Encounter: Payer: Self-pay | Admitting: *Deleted

## 2017-03-14 ENCOUNTER — Ambulatory Visit (INDEPENDENT_AMBULATORY_CARE_PROVIDER_SITE_OTHER): Payer: Medicaid Other | Admitting: General Surgery

## 2017-03-14 ENCOUNTER — Encounter: Payer: Self-pay | Admitting: General Surgery

## 2017-03-14 VITALS — BP 130/70 | HR 68 | Resp 16 | Ht 68.0 in | Wt 211.0 lb

## 2017-03-14 DIAGNOSIS — K439 Ventral hernia without obstruction or gangrene: Secondary | ICD-10-CM | POA: Diagnosis not present
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
## 2017-03-14 NOTE — Patient Instructions (Signed)
Hernia precautions and incarceration were discussed with the patient. If they develop symptoms of an incarcerated hernia, they were encouraged to seek prompt medical attention.  I have recommended repair of the hernia using mesh on an outpatient basis in the near future. The risk of infection was reviewed. The role of prosthetic mesh to minimize the risk of recurrence was reviewed.  Patient recommended to see Dr. Humphrey Rolls, his pulmonologist, for surgery clearance prior to hernia repair due to COPD.

## 2017-04-18 ENCOUNTER — Other Ambulatory Visit: Payer: Self-pay | Admitting: Internal Medicine

## 2017-04-18 DIAGNOSIS — J9611 Chronic respiratory failure with hypoxia: Secondary | ICD-10-CM

## 2017-05-02 ENCOUNTER — Ambulatory Visit: Payer: Self-pay | Attending: Internal Medicine

## 2017-05-04 ENCOUNTER — Other Ambulatory Visit: Payer: Self-pay | Admitting: Internal Medicine

## 2017-05-04 DIAGNOSIS — J9611 Chronic respiratory failure with hypoxia: Secondary | ICD-10-CM

## 2017-05-16 ENCOUNTER — Ambulatory Visit: Payer: Medicaid Other | Attending: Internal Medicine

## 2017-08-25 ENCOUNTER — Ambulatory Visit: Payer: Self-pay | Admitting: Urology

## 2017-10-05 ENCOUNTER — Ambulatory Visit: Payer: Medicaid Other | Admitting: General Surgery

## 2017-10-05 ENCOUNTER — Encounter: Payer: Self-pay | Admitting: General Surgery

## 2017-10-05 VITALS — BP 108/62 | HR 78 | Resp 16 | Ht 68.0 in | Wt 199.0 lb

## 2017-10-05 DIAGNOSIS — K439 Ventral hernia without obstruction or gangrene: Secondary | ICD-10-CM

## 2017-10-05 NOTE — Patient Instructions (Signed)
Laparoscopic Ventral Hernia Repair Laparoscopic ventral hernia repairis a procedure to fix a bulge of tissue that pushes through a weak area of muscle in the abdomen (ventral hernia). A ventral hernia may be at the belly button (umbilical), above the belly button (epigastric), or at the incision site from previous abdominal surgery (incisional hernia). You may have this procedure as emergency surgery if part of your intestine gets trapped inside the hernia and starts to lose its blood supply (strangulation). Laparoscopic surgery is done through small incisions using a thin surgical telescope with a light and camera on the end (laparoscope). During surgery, your surgeon will use images from the laparoscope to guide the procedure. A mesh screen will be placed in the hernia to close the opening and strengthen the abdominal wall. Tell a health care provider about:  Any allergies you have.  All medicines you are taking, including vitamins, herbs, eye drops, creams, and over-the-counter medicines.  Any problems you or family members have had with anesthetic medicines.  Any blood disorders you have.  Any surgeries you have had.  Any medical conditions you have.  Whether you are pregnant or may be pregnant. What are the risks? Generally, this is a safe procedure. However, problems may occur, including:  Infection.  Bleeding.  Allergic reactions to medicines.  Damage to other structures or organs in the abdomen.  Trouble urinating or having a bowel movement after surgery.  Pneumonia.  Blood clots.  The hernia coming back after surgery.  Fluid buildup in the area of the hernia.  In some cases, your health care provider may need to switch from a laparoscopic procedure to a procedure that is done through a single, larger incision in the abdomen (open procedure). You may need an open procedure if:  You have a hernia that is difficult to repair.  Your organs are hard to see.  You have  bleeding problems during the laparoscopic procedure.  What happens before the procedure? Staying hydrated Follow instructions from your health care provider about hydration, which may include:  Up to 2 hours before the procedure - you may continue to drink clear liquids, such as water, clear fruit juice, black coffee, and plain tea.  Eating and drinking restrictions Follow instructions from your health care provider about eating and drinking, which may include:  8 hours before the procedure - stop eating heavy meals or foods such as meat, fried foods, or fatty foods.  6 hours before the procedure - stop eating light meals or foods, such as toast or cereal.  6 hours before the procedure - stop drinking milk or drinks that contain milk.  2 hours before the procedure - stop drinking clear liquids.  Medicines  Ask your health care provider about: ? Changing or stopping your regular medicines. This is especially important if you are taking diabetes medicines or blood thinners. ? Taking medicines such as aspirin and ibuprofen. These medicines can thin your blood. Do not take these medicines before your procedure if your health care provider instructs you not to.  You may be given antibiotic medicine to help prevent infection. General instructions  You may be asked to take a laxative or do an enema to empty your bowel before surgery (bowel prep).  Do not use any products that contain nicotine or tobacco, such as cigarettes and e-cigarettes. If you need help quitting, ask your health care provider.  You may need to have tests before the procedure, such as: ? Blood tests. ? Urine tests. ?  Abdominal ultrasound. ? Chest X-ray. ? Electrocardiogram (ECG).  Plan to have someone take you home from the hospital or clinic.  If you will be going home right after the procedure, plan to have someone with you for 24 hours. What happens during the procedure?  To reduce your risk of  infection: ? Your health care team will wash or sanitize their hands. ? Your skin will be washed with soap.  An IV tube will be inserted into one of your veins.  You will be given one or more of the following: ? A medicine to help you relax (sedative). ? A medicine to make you fall asleep (general anesthetic).  A small incision will be made in your abdomen. A hollow metal tube (trocar) will be placed through the incision.  A tube will be placed through the trocar to inflate your abdomen with air-like gas. This makes it easier for your surgeon to see inside your abdomen and do the repair.  The laparoscope will be inserted into your abdomen through the trocar. The laparoscope will send images to a monitor in the operating room.  Other trocars will be put through other small incisions in your abdomen. The surgical instruments needed for the procedure will be placed through these trocars.  The tissue or intestines that make up the hernia will be moved back into place.  The edges of the hernia may be stitched together.  A piece of mesh will be used to close the hernia. Stitches (sutures), clips, or staples will be used to keep the mesh in place.  A bandage (dressing) or skin glue will be put over the incisions. The procedure may vary among health care providers and hospitals. What happens after the procedure?  Your blood pressure, heart rate, breathing rate, and blood oxygen level will be monitored until the medicines you were given have worn off.  You will continue to receive fluids and medicines through an IV tube. Your IV tube will be removed when you can drink clear fluids.  You will be given pain medicine as needed.  You will be encouraged to get up and walk around as soon as possible.  You may have to wear compression stockings. These stockings help to prevent blood clots and reduce swelling in your legs.  You will be shown how to do deep breathing exercises to help prevent a  lung infection.  Do not drive for 24 hours if you were given a sedative. This information is not intended to replace advice given to you by your health care provider. Make sure you discuss any questions you have with your health care provider. Document Released: 05/30/2012 Document Revised: 01/29/2016 Document Reviewed: 01/29/2016 [employer] Interactive Patient Education  2018 [employer]  
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
## 2017-10-06 ENCOUNTER — Telehealth: Payer: Self-pay

## 2017-10-06 NOTE — Telephone Encounter (Signed)
Call to notify patient of pre admit appointment. He will pre admit at the hospital on 10/31/17 at 1:00 pm. The patient is aware of date and time.

## 2017-10-19 ENCOUNTER — Ambulatory Visit (INDEPENDENT_AMBULATORY_CARE_PROVIDER_SITE_OTHER): Payer: Medicaid Other | Admitting: Urology

## 2017-10-19 ENCOUNTER — Encounter: Payer: Self-pay | Admitting: Urology

## 2017-10-19 VITALS — BP 124/65 | HR 96 | Ht 69.0 in | Wt 200.0 lb

## 2017-10-19 DIAGNOSIS — N138 Other obstructive and reflux uropathy: Secondary | ICD-10-CM | POA: Diagnosis not present

## 2017-10-19 DIAGNOSIS — N401 Enlarged prostate with lower urinary tract symptoms: Secondary | ICD-10-CM

## 2017-10-19 DIAGNOSIS — N400 Enlarged prostate without lower urinary tract symptoms: Secondary | ICD-10-CM

## 2017-10-19 DIAGNOSIS — N402 Nodular prostate without lower urinary tract symptoms: Secondary | ICD-10-CM | POA: Diagnosis not present

## 2017-10-19 DIAGNOSIS — Z87898 Personal history of other specified conditions: Secondary | ICD-10-CM

## 2017-10-19 LAB — BLADDER SCAN AMB NON-IMAGING
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
## 2017-10-20 LAB — URINALYSIS, COMPLETE
BILIRUBIN UA: NEGATIVE
GLUCOSE, UA: NEGATIVE
Ketones, UA: NEGATIVE
Leukocytes, UA: NEGATIVE
Nitrite, UA: NEGATIVE
PH UA: 5 (ref 5.0–7.5)
PROTEIN UA: NEGATIVE
RBC, UA: NEGATIVE
Specific Gravity, UA: <1.005 — ABNORMAL LOW (ref 1.005–1.030)
Urobilinogen, Ur: 0.2 mg/dL (ref 0.2–1.0)

## 2017-10-20 LAB — PSA: Prostate Specific Ag, Serum: 1.4 ng/mL (ref 0.0–4.0)

## 2017-10-31 ENCOUNTER — Encounter
Admission: RE | Admit: 2017-10-31 | Discharge: 2017-10-31 | Disposition: A | Discharge status: Home / Self Care | Payer: Medicaid Other | Source: Ambulatory Visit | Attending: General Surgery | Admitting: General Surgery

## 2017-10-31 ENCOUNTER — Other Ambulatory Visit: Payer: Self-pay

## 2017-10-31 DIAGNOSIS — J449 Chronic obstructive pulmonary disease, unspecified: Secondary | ICD-10-CM | POA: Diagnosis not present

## 2017-10-31 DIAGNOSIS — Z01818 Encounter for other preprocedural examination: Secondary | ICD-10-CM | POA: Insufficient documentation

## 2017-10-31 DIAGNOSIS — Z01812 Encounter for preprocedural laboratory examination: Secondary | ICD-10-CM | POA: Insufficient documentation

## 2017-10-31 DIAGNOSIS — I10 Essential (primary) hypertension: Secondary | ICD-10-CM | POA: Diagnosis not present

## 2017-10-31 LAB — DIFFERENTIAL
BASOS PCT: 1 %
Basophils Absolute: 0 10*3/uL (ref 0–0.1)
EOS ABS: 0.6 10*3/uL (ref 0–0.7)
EOS PCT: 8 %
Lymphocytes Relative: 27 %
Lymphs Abs: 1.9 10*3/uL (ref 1.0–3.6)
Monocytes Absolute: 0.4 10*3/uL (ref 0.2–1.0)
Monocytes Relative: 6 %
Neutro Abs: 4.1 10*3/uL (ref 1.4–6.5)
Neutrophils Relative %: 58 %

## 2017-10-31 LAB — BASIC METABOLIC PANEL WITH GFR
Anion gap: 6 (ref 5–15)
BUN: 10 mg/dL (ref 6–20)
CO2: 39 mmol/L — ABNORMAL HIGH (ref 22–32)
Calcium: 8.6 mg/dL — ABNORMAL LOW (ref 8.9–10.3)
Chloride: 93 mmol/L — ABNORMAL LOW (ref 101–111)
Creatinine, Ser: 1.02 mg/dL (ref 0.61–1.24)
GFR calc Af Amer: >60 mL/min
GFR calc non Af Amer: >60 mL/min
Glucose, Bld: 101 mg/dL — ABNORMAL HIGH (ref 65–99)
Potassium: 3.6 mmol/L (ref 3.5–5.1)
Sodium: 138 mmol/L (ref 135–145)

## 2017-10-31 LAB — CBC
HCT: 42.6 % (ref 40.0–52.0)
Hemoglobin: 14.1 g/dL (ref 13.0–18.0)
MCH: 31.3 pg (ref 26.0–34.0)
MCHC: 33 g/dL (ref 32.0–36.0)
MCV: 94.6 fL (ref 80.0–100.0)
PLATELETS: 145 10*3/uL — AB (ref 150–440)
RBC: 4.5 MIL/uL (ref 4.40–5.90)
RDW: 15.4 % — AB (ref 11.5–14.5)
WBC: 7.1 10*3/uL (ref 3.8–10.6)

## 2017-10-31 NOTE — Pre-Procedure Instructions (Signed)
Dr. Gala Murdoch office notified that medical/pulmonary clearance must be received before surgery can be done.  Faxed request.

## 2017-10-31 NOTE — Patient Instructions (Signed)
Your procedure is scheduled on: Friday 11/10/17 Report to Day Surgery. To find out your arrival time please call [phone] between 1PM - 3PM on Thurs. 11/09/17.  Remember: Instructions that are not followed completely may result in serious medical risk, up to and including death, or upon the discretion of your surgeon and anesthesiologist your surgery may need to be rescheduled.     _X__ 1. Do not eat food after midnight the night before your procedure.                 No gum chewing or hard candies. You may drink clear liquids up to 2 hours                 before you are scheduled to arrive for your surgery- DO not drink clear                 liquids within 2 hours of the start of your surgery.                 Clear Liquids include:  water, apple juice without pulp, clear carbohydrate                 drink such as Clearfast of Gartorade, Black Coffee or Tea (Do not add                 anything to coffee or tea).  __X__2.  On the morning of surgery brush your teeth with toothpaste and water, you may rinse your mouth with mouthwash if you wish.  Do not swallow any              toothpaste of mouthwash.     _X__ 3.  No Alcohol for 24 hours before or after surgery.   _X__ 4.  Do Not Smoke or use e-cigarettes For 24 Hours Prior to Your Surgery.                 Do not use any chewable tobacco products for at least 6 hours prior to                 surgery.  ____  5.  Bring all medications with you on the day of surgery if instructed.   __x__  6.  Notify your doctor if there is any change in your medical condition      (cold, fever, infections).     Do not wear jewelry, make-up, hairpins, clips or nail polish. Do not wear lotions, powders, or perfumes. You may wear deodorant. Do not shave 48 hours prior to surgery. Men may shave face and neck. Do not bring valuables to the hospital.    [facility] is not responsible for any belongings or valuables.  Contacts,  dentures or bridgework may not be worn into surgery. Leave your suitcase in the car. After surgery it may be brought to your room. For patients admitted to the hospital, discharge time is determined by your treatment team.   Patients discharged the day of surgery will not be allowed to drive home.   Please read over the following fact sheets that you were given:    __x__ Take these medicines the morning of surgery with A SIP OF WATER:    1. cloNIDine (CATAPRES) 0.1 MG tablet  2. DULoxetine (CYMBALTA) 60 MG capsule  3. finasteride (PROSCAR) 5 MG tablet  4.gabapentin (NEURONTIN) 400 MG capsule  5.lamoTRIgine (LAMICTAL) 100 MG tablet  6.oxyCODONE (OXYCONTIN) 20 mg 12 hr tablet  7.Roflumilast (DALIRESP) 250 MCG TABS  ____ Fleet Enema (as directed)   ____ Use CHG Soap as directed  __x__ Use inhalers on the day of surgeryalbuterol (PROVENTIL HFA;VENTOLIN HFA) 108 (90 Base) MCG/ACT inhaler and take to the hospital  ____ Stop metformin 2 days prior to surgery    ____ Take 1/2 of usual insulin dose the night before surgery. No insulin the morning          of surgery.   ____ Stop Coumadin/Plavix/aspirin on   ___x_ Stop Anti-inflammatories on ibuprofen (ADVIL,MOTRIN) 200 MG tablet   ___x_ Stop supplements until after surgery. Melatonin 5 MG TABS,Liver aid otc supplement, FOCUSED MIND, vitamin E 400 UNIT capsule  ____ Bring C-Pap to the hospital.

## 2017-10-31 NOTE — Pre-Procedure Instructions (Signed)
Dr. Amie Critchley states the patient must have evaluation by his PCP and be optimized before surgery.

## 2017-11-02 ENCOUNTER — Telehealth: Payer: Self-pay | Admitting: *Deleted

## 2017-11-02 NOTE — Pre-Procedure Instructions (Signed)
DR Brunetta Genera CLEARED AS HIGH RISK AND STILL NEEDING PULMONARY CLEARANCE. SPOKE WITH DR Brunetta Genera WHO WILL SET UP APPOINTMENT. Gleed AT DR Dwyane Luo NOTIFIED

## 2017-11-02 NOTE — Telephone Encounter (Signed)
Per Judeen Hammans with the Anesthesia department, the patient received medical clearance from Dr. Brunetta Genera at high risk status for surgery. Anesthesia is also requesting pulmonary clearance which Dr. Gala Murdoch office is to be arranging.   Surgery presently scheduled for 11-10-17 pending pulmonary clearance.

## 2017-11-09 ENCOUNTER — Institutional Professional Consult (permissible substitution): Payer: Medicaid Other | Admitting: Internal Medicine

## 2017-11-09 ENCOUNTER — Encounter: Payer: Self-pay | Admitting: *Deleted

## 2017-11-09 NOTE — Progress Notes (Signed)
Patient did not show for his pulmonary clearance appointment with Dr. Mortimer Fries today, 11-09-17 at 9:30 am.   The patient was contacted today and states he was unaware of appointment even though he said he received a call on Wednesday from their office.   Dr. Mortimer Fries could see patient this afternoon at 3:30 pm but patient declined because he could not get a ride.   Dr. Zoila Shutter office notified that patient will not be coming for appointment this afternoon.   Leah in the [facility] Has been notified of surgery cancellation.   Patient instructed to call the office once he has obtained pulmonary clearance so we can get surgery rescheduled. He verbalizes understanding.

## 2017-11-09 NOTE — Pre-Procedure Instructions (Signed)
PATIENT NO SHOW FOR PULMONARY CLEARANCE APPT. WITH DR Mortimer Fries TODAY. MICHELE AT DR [facility]RNETT'S NOTIFIED AND IS [facility] PATIENT FOR 11/10/17

## 2017-11-10 ENCOUNTER — Ambulatory Visit: Admission: RE | Admit: 2017-11-10 | Payer: Medicaid Other | Source: Ambulatory Visit | Admitting: General Surgery

## 2017-11-10 ENCOUNTER — Encounter: Admission: RE | Payer: Self-pay | Source: Ambulatory Visit

## 2017-11-10 SURGERY — REPAIR, HERNIA, VENTRAL
Anesthesia: General

## 2017-11-14 ENCOUNTER — Other Ambulatory Visit: Payer: Self-pay | Admitting: Urology

## 2017-11-14 ENCOUNTER — Telehealth: Payer: Self-pay | Admitting: Urology

## 2017-11-14 NOTE — Telephone Encounter (Signed)
Patient thought it was on 11-15-17 reschd  Steven Wiley

## 2017-11-14 NOTE — Telephone Encounter (Signed)
This patient was a no show to his prostate biopsy today.  Please return to him personally to reschedule.  If he does not respond, please send a certified letter.  Hollice Espy, MD

## 2017-11-27 ENCOUNTER — Institutional Professional Consult (permissible substitution): Payer: Medicaid Other | Admitting: Internal Medicine

## 2017-11-27 NOTE — Progress Notes (Deleted)
Imaging personally reviewed, chest x-ray 08/15/2016; hyperinflation consistent with emphysema.  Reduced lung volumes on the right lung, due to chronic pleural parenchymal scarring.

## 2017-11-28 ENCOUNTER — Other Ambulatory Visit: Payer: Self-pay | Admitting: Urology

## 2017-11-28 ENCOUNTER — Encounter: Payer: Self-pay | Admitting: Urology

## 2017-11-28 ENCOUNTER — Encounter: Payer: Self-pay | Admitting: Internal Medicine

## 2017-11-28 ENCOUNTER — Ambulatory Visit: Payer: Self-pay | Admitting: Urology

## 2017-12-25 DEATH — deceased
